# Patient Record
Sex: Female | Born: 1945 | Race: White | Hispanic: No | Marital: Married | Smoking: Former smoker
Health system: Southern US, Community
[De-identification: ages and names within clinical notes are randomized; demographics above are authoritative.]

## PROBLEM LIST (undated history)

## (undated) DIAGNOSIS — E119 Type 2 diabetes mellitus without complications: Secondary | ICD-10-CM

## (undated) DIAGNOSIS — I2510 Atherosclerotic heart disease of native coronary artery without angina pectoris: Secondary | ICD-10-CM

## (undated) DIAGNOSIS — D649 Anemia, unspecified: Secondary | ICD-10-CM

## (undated) DIAGNOSIS — I10 Essential (primary) hypertension: Secondary | ICD-10-CM

## (undated) HISTORY — PX: TONSILLECTOMY: SUR1361

## (undated) HISTORY — PX: BACK SURGERY: SHX140

## (undated) HISTORY — DX: Essential (primary) hypertension: I10

---

## 1970-04-12 HISTORY — PX: CHOLECYSTECTOMY OPEN: SUR202

## 1988-04-12 HISTORY — PX: LUMBAR DISC SURGERY: SHX700

## 2002-04-12 HISTORY — PX: INCISION AND DRAINAGE: SHX5863

## 2002-07-11 ENCOUNTER — Encounter (INDEPENDENT_AMBULATORY_CARE_PROVIDER_SITE_OTHER): Payer: Self-pay | Admitting: *Deleted

## 2002-07-11 ENCOUNTER — Inpatient Hospital Stay (HOSPITAL_COMMUNITY): Admission: RE | Admit: 2002-07-11 | Discharge: 2002-07-16 | Payer: Self-pay | Admitting: Neurosurgery

## 2002-07-11 ENCOUNTER — Encounter: Payer: Self-pay | Admitting: Neurosurgery

## 2002-07-12 ENCOUNTER — Encounter: Payer: Self-pay | Admitting: Neurosurgery

## 2002-07-13 ENCOUNTER — Encounter: Payer: Self-pay | Admitting: Internal Medicine

## 2002-07-24 ENCOUNTER — Encounter: Admission: RE | Admit: 2002-07-24 | Discharge: 2002-10-22 | Payer: Self-pay | Admitting: Family Medicine

## 2002-08-20 ENCOUNTER — Encounter: Admission: RE | Admit: 2002-08-20 | Discharge: 2002-08-20 | Payer: Self-pay | Admitting: Infectious Diseases

## 2002-08-22 ENCOUNTER — Encounter: Admission: RE | Admit: 2002-08-22 | Discharge: 2002-08-22 | Payer: Self-pay | Admitting: Infectious Diseases

## 2002-10-01 ENCOUNTER — Encounter: Admission: RE | Admit: 2002-10-01 | Discharge: 2002-10-01 | Payer: Self-pay | Admitting: Infectious Diseases

## 2002-10-29 ENCOUNTER — Encounter: Admission: RE | Admit: 2002-10-29 | Discharge: 2002-10-29 | Payer: Self-pay | Admitting: Infectious Diseases

## 2002-12-10 ENCOUNTER — Encounter: Admission: RE | Admit: 2002-12-10 | Discharge: 2002-12-10 | Payer: Self-pay | Admitting: Infectious Diseases

## 2002-12-10 ENCOUNTER — Ambulatory Visit (HOSPITAL_COMMUNITY): Admission: RE | Admit: 2002-12-10 | Discharge: 2002-12-10 | Payer: Self-pay | Admitting: Infectious Diseases

## 2002-12-10 ENCOUNTER — Encounter (INDEPENDENT_AMBULATORY_CARE_PROVIDER_SITE_OTHER): Payer: Self-pay | Admitting: Infectious Diseases

## 2003-01-16 ENCOUNTER — Encounter: Admission: RE | Admit: 2003-01-16 | Discharge: 2003-01-16 | Payer: Self-pay | Admitting: Infectious Diseases

## 2007-02-03 ENCOUNTER — Encounter: Admission: RE | Admit: 2007-02-03 | Discharge: 2007-02-03 | Payer: Self-pay | Admitting: Family Medicine

## 2010-08-28 NOTE — Discharge Summary (Signed)
NAME:  Karen Cooper, Karen Cooper                         ACCOUNT NO.:  [id number]   MEDICAL RECORD NO.:  [id number]                   PATIENT TYPE:  INP   LOCATION:  3024                                 FACILITY:  [facility]   PHYSICIAN:  Otilio Connors, M.D.               DATE OF BIRTH:  09-Dec-1945   DATE OF ADMISSION:  07/11/2002  DATE OF DISCHARGE:  07/16/2002                                 DISCHARGE SUMMARY   DIAGNOSIS:  Lumbar paraspinal epidural abscess.   DISCHARGE DIAGNOSIS:  Lumbar paraspinal epidural abscess.   PROCEDURES:  Laminectomy and drainage of epidural abscess and paraspinous  and psoas abscess on July 11, 2002.   CONSULTATIONS:  Dr. Arelia Longest. Quentin Cornwall, infectious disease.   REASON FOR ADMISSION:  The patient is a 65 year old woman who was having  right-sided back pain and upper leg pain.  She had an MRI done showing some  abnormalities as well as tumor versus abscess.  She was sent back for an MRI  with contrast and some labs.  White count was 24, sed rate 90, C-reactive  protein 3.4.  Urinalysis showed glucose greater than 1000; she does give a  history of borderline diabetes and patient may be a full diabetic at this  point.  MRI showed a psoas abscess extending to the posterior paraspinal  muscles, eating at the 3-4 facet and going into the epidural space and  extending up to 2-3.  The patient was brought in for laminectomy, drainage  of the abscess and cultures.   HOSPITAL COURSE:  The patient was admitted and underwent surgery, as  mentioned above, on July 11, 2002.  JP drain was placed and surgical Gram's  stain was positive for gram-positive cocci.  Infectious disease was  consulted and the patient was subsequently begun on antibiotics.  JP drain  has put out 40 to 50 mL in the first 24 hours.  She had some incisional pain  but ambulating and incision was dry and continued to do well.  Blood sugars  were elevated and we consulted medicine for their  recommendations there.  Cultures were positive and kept on antibiotics and was set up for home  health antibiotics, namely Rocephin.  She continued to improve with  lessening of pain, ambulating all right, afebrile and the patient was  discharged home on July 16, 2002 with home health antibiotics, to follow up  with me in one week for evaluation of incision, to follow up with infectious  disease, Dr. Quentin Cornwall, and to follow up with her primary M.D. for evaluation  for diabetes and evaluation for need for any further treatment there.                                               Otilio Connors,  M.D.    JRH/MEDQ  D:  08/14/2002  T:  08/15/2002  Job:  [id number]

## 2010-08-28 NOTE — Op Note (Signed)
NAME:  Karen Cooper, Karen Cooper                         ACCOUNT NO.:  [id number]   MEDICAL RECORD NO.:  [id number]                   PATIENT TYPE:  INP   LOCATION:  2871                                 FACILITY:  [facility]   PHYSICIAN:  Otilio Connors, M.D.               DATE OF BIRTH:  March 27, 1946   DATE OF PROCEDURE:  07/11/2002  DATE OF DISCHARGE:                                 OPERATIVE REPORT   PREOPERATIVE DIAGNOSIS:  Lumbar spinal with epidural abscess, paraspinal  psoas abscess spontaneous.   POSTOPERATIVE DIAGNOSIS:  Lumbar spinal with epidural abscess, paraspinal  psoas abscess spontaneous.   OPERATION:  L3-4 laminectomy evacuation of dural hematoma facetectomy  drainage paraspinous and psoas abscess.   SURGEON:  Otilio Connors, M.D.   ASSISTANT:  Earleen Newport, M.D.   ANESTHESIA:  Endotracheal   ESTIMATED BLOOD LOSS:  100 cc.   FLUIDS REPLACED:  None.   DRAIN:  JP drain placed.   GRAM STAIN:  Showed gram positive cocci.   INDICATIONS FOR PROCEDURE:  The patient is a 65 year old woman who gives a  history of borderline diabetes but found to have a high sugar on  preoperative labs and ketones and glucose in the urine.  She had abscess  diagnosed by MRI in the psoas paraspinous musculature and extending into the  epidural space at L3-4 and part of the 3-4 facet.  The patient brought in  for drainage of abscess.   DESCRIPTION OF PROCEDURE:  The patient was brought to the operating room.  General anesthesia was induced.  The patient was placed in a prone position  on a Wilson frame with all pressure points padded.  The patient was prepped  and draped in the usual sterile fashion.  Site of incision was injected with  7 cc of 1% Lidocaine with epinephrine.  Needle was placed in the interspace.  The needle was pointing at what looked like the prespinous process but we  did not see the sacral ring in five vertebral bodies clearly.  Incision was  then made over this area,  taken to the fascia.  Hemostasis obtained with  Bovie cauterization.  The fascia was incised on the right side and  subperiosteal dissection was done over the L3 and 4 spinous processes where  first x-ray obtained.  We got a better x-ray at this time and this did look  like our marker was at the 2-3 interspace.  We dissected that inferiorly and  placed retractors there, as we did that pus came out of the paraspinous  muscles and we evacuated that and cultured this for anaerobic, aerobic and  fungi with stat Gram stain.  Gram stain came back positive for cocci.  We  took another x-ray at the 3-4 confirming our position.  High speed drill was  then used to perform a laminectomy and facetectomy was completed with  Kerrison rongeurs.  There was  pus coming out of the facet joint.  The  superior facet was removed.  When we were finished we had good decompression  of the thecal sac and we were able to put a dilator up from superior  exposure up to the 2-3 interspace and there was no further compression or  pus.  We put a probe out the foramen and it was also decompressed.  Attention then was taken down to the lateral facet following the transverse  process and just above the transverse process we placed a probe down into  the psoas and pus was obtained.  We placed a red rubber catheter into the  psoas and irrigated more pus out.  We then irrigated with antibiotic  solution.  We gave the patient Vancomycin and Gentamicin IV at this point.  Now that her cultures were obtained.  Wound was irrigated with antibiotic  solution and JP drain was placed through separate stab wound and placed  lateral to the facets off the dura.  Retractor was removed.  The fascia was  closed with 2-0 Nylon interrupted sutures, approximating the fascia and then  the skin was closed with 2-0 Nylon mattress sutures approximating the skin,  overall fairly loose closure allowing drainage if need be.  Drain was  sutured with 2-0  Nylon fastening the JP well.  Dressing was placed.  The  patient was placed back to a supine position, awakened from anesthesia and  transferred to recovery room in stable condition.  The patient tolerated the  procedure well.                                               Otilio Connors, M.D.    JRH/MEDQ  D:  07/11/2002  T:  07/12/2002  Job:  [id number]

## 2011-11-05 DIAGNOSIS — H6980 Other specified disorders of Eustachian tube, unspecified ear: Secondary | ICD-10-CM | POA: Diagnosis not present

## 2011-11-05 DIAGNOSIS — H9190 Unspecified hearing loss, unspecified ear: Secondary | ICD-10-CM | POA: Diagnosis not present

## 2011-12-20 DIAGNOSIS — J309 Allergic rhinitis, unspecified: Secondary | ICD-10-CM | POA: Diagnosis not present

## 2011-12-20 DIAGNOSIS — F17200 Nicotine dependence, unspecified, uncomplicated: Secondary | ICD-10-CM | POA: Diagnosis not present

## 2011-12-20 DIAGNOSIS — I10 Essential (primary) hypertension: Secondary | ICD-10-CM | POA: Diagnosis not present

## 2012-01-03 DIAGNOSIS — I10 Essential (primary) hypertension: Secondary | ICD-10-CM | POA: Diagnosis not present

## 2012-01-03 DIAGNOSIS — F17200 Nicotine dependence, unspecified, uncomplicated: Secondary | ICD-10-CM | POA: Diagnosis not present

## 2012-01-03 DIAGNOSIS — Z23 Encounter for immunization: Secondary | ICD-10-CM | POA: Diagnosis not present

## 2012-01-03 DIAGNOSIS — I509 Heart failure, unspecified: Secondary | ICD-10-CM | POA: Diagnosis not present

## 2012-01-25 DIAGNOSIS — F17200 Nicotine dependence, unspecified, uncomplicated: Secondary | ICD-10-CM | POA: Diagnosis not present

## 2012-01-25 DIAGNOSIS — Z0000 Encounter for general adult medical examination without abnormal findings: Secondary | ICD-10-CM | POA: Diagnosis not present

## 2012-01-25 DIAGNOSIS — I10 Essential (primary) hypertension: Secondary | ICD-10-CM | POA: Diagnosis not present

## 2012-02-01 DIAGNOSIS — F17200 Nicotine dependence, unspecified, uncomplicated: Secondary | ICD-10-CM | POA: Diagnosis not present

## 2012-02-01 DIAGNOSIS — E785 Hyperlipidemia, unspecified: Secondary | ICD-10-CM | POA: Diagnosis not present

## 2012-02-01 DIAGNOSIS — E1129 Type 2 diabetes mellitus with other diabetic kidney complication: Secondary | ICD-10-CM | POA: Diagnosis not present

## 2012-02-01 DIAGNOSIS — N182 Chronic kidney disease, stage 2 (mild): Secondary | ICD-10-CM | POA: Diagnosis not present

## 2012-02-01 DIAGNOSIS — E1165 Type 2 diabetes mellitus with hyperglycemia: Secondary | ICD-10-CM | POA: Diagnosis not present

## 2012-02-01 DIAGNOSIS — Z13820 Encounter for screening for osteoporosis: Secondary | ICD-10-CM | POA: Diagnosis not present

## 2012-02-01 DIAGNOSIS — R3129 Other microscopic hematuria: Secondary | ICD-10-CM | POA: Diagnosis not present

## 2012-02-01 DIAGNOSIS — I10 Essential (primary) hypertension: Secondary | ICD-10-CM | POA: Diagnosis not present

## 2012-03-02 DIAGNOSIS — R3129 Other microscopic hematuria: Secondary | ICD-10-CM | POA: Diagnosis not present

## 2012-04-26 DIAGNOSIS — E785 Hyperlipidemia, unspecified: Secondary | ICD-10-CM | POA: Diagnosis not present

## 2012-04-26 DIAGNOSIS — E1129 Type 2 diabetes mellitus with other diabetic kidney complication: Secondary | ICD-10-CM | POA: Diagnosis not present

## 2012-04-26 DIAGNOSIS — I10 Essential (primary) hypertension: Secondary | ICD-10-CM | POA: Diagnosis not present

## 2012-05-03 DIAGNOSIS — N182 Chronic kidney disease, stage 2 (mild): Secondary | ICD-10-CM | POA: Diagnosis not present

## 2012-05-03 DIAGNOSIS — I10 Essential (primary) hypertension: Secondary | ICD-10-CM | POA: Diagnosis not present

## 2012-05-03 DIAGNOSIS — E1165 Type 2 diabetes mellitus with hyperglycemia: Secondary | ICD-10-CM | POA: Diagnosis not present

## 2012-05-03 DIAGNOSIS — E1129 Type 2 diabetes mellitus with other diabetic kidney complication: Secondary | ICD-10-CM | POA: Diagnosis not present

## 2012-05-03 DIAGNOSIS — F17200 Nicotine dependence, unspecified, uncomplicated: Secondary | ICD-10-CM | POA: Diagnosis not present

## 2012-05-03 DIAGNOSIS — E785 Hyperlipidemia, unspecified: Secondary | ICD-10-CM | POA: Diagnosis not present

## 2012-07-26 DIAGNOSIS — E1165 Type 2 diabetes mellitus with hyperglycemia: Secondary | ICD-10-CM | POA: Diagnosis not present

## 2012-07-26 DIAGNOSIS — I10 Essential (primary) hypertension: Secondary | ICD-10-CM | POA: Diagnosis not present

## 2012-07-26 DIAGNOSIS — E1129 Type 2 diabetes mellitus with other diabetic kidney complication: Secondary | ICD-10-CM | POA: Diagnosis not present

## 2012-08-02 DIAGNOSIS — E785 Hyperlipidemia, unspecified: Secondary | ICD-10-CM | POA: Diagnosis not present

## 2012-08-02 DIAGNOSIS — E1165 Type 2 diabetes mellitus with hyperglycemia: Secondary | ICD-10-CM | POA: Diagnosis not present

## 2012-08-02 DIAGNOSIS — I10 Essential (primary) hypertension: Secondary | ICD-10-CM | POA: Diagnosis not present

## 2012-08-02 DIAGNOSIS — N182 Chronic kidney disease, stage 2 (mild): Secondary | ICD-10-CM | POA: Diagnosis not present

## 2012-11-01 DIAGNOSIS — E785 Hyperlipidemia, unspecified: Secondary | ICD-10-CM | POA: Diagnosis not present

## 2012-11-01 DIAGNOSIS — E1129 Type 2 diabetes mellitus with other diabetic kidney complication: Secondary | ICD-10-CM | POA: Diagnosis not present

## 2012-11-08 DIAGNOSIS — E785 Hyperlipidemia, unspecified: Secondary | ICD-10-CM | POA: Diagnosis not present

## 2012-11-08 DIAGNOSIS — I10 Essential (primary) hypertension: Secondary | ICD-10-CM | POA: Diagnosis not present

## 2012-11-08 DIAGNOSIS — E1129 Type 2 diabetes mellitus with other diabetic kidney complication: Secondary | ICD-10-CM | POA: Diagnosis not present

## 2012-11-08 DIAGNOSIS — F17200 Nicotine dependence, unspecified, uncomplicated: Secondary | ICD-10-CM | POA: Diagnosis not present

## 2012-11-08 DIAGNOSIS — N182 Chronic kidney disease, stage 2 (mild): Secondary | ICD-10-CM | POA: Diagnosis not present

## 2013-02-05 DIAGNOSIS — I10 Essential (primary) hypertension: Secondary | ICD-10-CM | POA: Diagnosis not present

## 2013-02-05 DIAGNOSIS — E1129 Type 2 diabetes mellitus with other diabetic kidney complication: Secondary | ICD-10-CM | POA: Diagnosis not present

## 2013-02-05 DIAGNOSIS — Z23 Encounter for immunization: Secondary | ICD-10-CM | POA: Diagnosis not present

## 2013-02-05 DIAGNOSIS — Z0000 Encounter for general adult medical examination without abnormal findings: Secondary | ICD-10-CM | POA: Diagnosis not present

## 2013-02-05 DIAGNOSIS — Z1331 Encounter for screening for depression: Secondary | ICD-10-CM | POA: Diagnosis not present

## 2013-02-13 DIAGNOSIS — E785 Hyperlipidemia, unspecified: Secondary | ICD-10-CM | POA: Diagnosis not present

## 2013-02-13 DIAGNOSIS — I10 Essential (primary) hypertension: Secondary | ICD-10-CM | POA: Diagnosis not present

## 2013-02-13 DIAGNOSIS — E1129 Type 2 diabetes mellitus with other diabetic kidney complication: Secondary | ICD-10-CM | POA: Diagnosis not present

## 2013-02-13 DIAGNOSIS — F17200 Nicotine dependence, unspecified, uncomplicated: Secondary | ICD-10-CM | POA: Diagnosis not present

## 2013-03-14 DIAGNOSIS — E119 Type 2 diabetes mellitus without complications: Secondary | ICD-10-CM | POA: Diagnosis not present

## 2013-05-15 DIAGNOSIS — E785 Hyperlipidemia, unspecified: Secondary | ICD-10-CM | POA: Diagnosis not present

## 2013-05-15 DIAGNOSIS — E1165 Type 2 diabetes mellitus with hyperglycemia: Secondary | ICD-10-CM | POA: Diagnosis not present

## 2013-05-15 DIAGNOSIS — E1129 Type 2 diabetes mellitus with other diabetic kidney complication: Secondary | ICD-10-CM | POA: Diagnosis not present

## 2013-05-22 DIAGNOSIS — E1129 Type 2 diabetes mellitus with other diabetic kidney complication: Secondary | ICD-10-CM | POA: Diagnosis not present

## 2013-05-22 DIAGNOSIS — N183 Chronic kidney disease, stage 3 unspecified: Secondary | ICD-10-CM | POA: Diagnosis not present

## 2013-05-22 DIAGNOSIS — F17200 Nicotine dependence, unspecified, uncomplicated: Secondary | ICD-10-CM | POA: Diagnosis not present

## 2013-05-22 DIAGNOSIS — E785 Hyperlipidemia, unspecified: Secondary | ICD-10-CM | POA: Diagnosis not present

## 2013-05-22 DIAGNOSIS — I10 Essential (primary) hypertension: Secondary | ICD-10-CM | POA: Diagnosis not present

## 2013-08-13 DIAGNOSIS — E1129 Type 2 diabetes mellitus with other diabetic kidney complication: Secondary | ICD-10-CM | POA: Diagnosis not present

## 2013-08-13 DIAGNOSIS — I10 Essential (primary) hypertension: Secondary | ICD-10-CM | POA: Diagnosis not present

## 2013-08-20 DIAGNOSIS — F17200 Nicotine dependence, unspecified, uncomplicated: Secondary | ICD-10-CM | POA: Diagnosis not present

## 2013-08-20 DIAGNOSIS — E785 Hyperlipidemia, unspecified: Secondary | ICD-10-CM | POA: Diagnosis not present

## 2013-08-20 DIAGNOSIS — I10 Essential (primary) hypertension: Secondary | ICD-10-CM | POA: Diagnosis not present

## 2013-08-20 DIAGNOSIS — N183 Chronic kidney disease, stage 3 unspecified: Secondary | ICD-10-CM | POA: Diagnosis not present

## 2013-08-20 DIAGNOSIS — E1129 Type 2 diabetes mellitus with other diabetic kidney complication: Secondary | ICD-10-CM | POA: Diagnosis not present

## 2013-11-14 DIAGNOSIS — E1129 Type 2 diabetes mellitus with other diabetic kidney complication: Secondary | ICD-10-CM | POA: Diagnosis not present

## 2013-11-14 DIAGNOSIS — I10 Essential (primary) hypertension: Secondary | ICD-10-CM | POA: Diagnosis not present

## 2013-11-20 DIAGNOSIS — I10 Essential (primary) hypertension: Secondary | ICD-10-CM | POA: Diagnosis not present

## 2013-11-20 DIAGNOSIS — F17200 Nicotine dependence, unspecified, uncomplicated: Secondary | ICD-10-CM | POA: Diagnosis not present

## 2013-11-20 DIAGNOSIS — E785 Hyperlipidemia, unspecified: Secondary | ICD-10-CM | POA: Diagnosis not present

## 2013-11-20 DIAGNOSIS — N183 Chronic kidney disease, stage 3 unspecified: Secondary | ICD-10-CM | POA: Diagnosis not present

## 2013-11-20 DIAGNOSIS — E1129 Type 2 diabetes mellitus with other diabetic kidney complication: Secondary | ICD-10-CM | POA: Diagnosis not present

## 2014-02-22 DIAGNOSIS — Z0000 Encounter for general adult medical examination without abnormal findings: Secondary | ICD-10-CM | POA: Diagnosis not present

## 2014-02-22 DIAGNOSIS — I10 Essential (primary) hypertension: Secondary | ICD-10-CM | POA: Diagnosis not present

## 2014-02-22 DIAGNOSIS — Z1389 Encounter for screening for other disorder: Secondary | ICD-10-CM | POA: Diagnosis not present

## 2014-02-22 DIAGNOSIS — E1121 Type 2 diabetes mellitus with diabetic nephropathy: Secondary | ICD-10-CM | POA: Diagnosis not present

## 2014-02-22 DIAGNOSIS — Z23 Encounter for immunization: Secondary | ICD-10-CM | POA: Diagnosis not present

## 2014-02-28 DIAGNOSIS — F17210 Nicotine dependence, cigarettes, uncomplicated: Secondary | ICD-10-CM | POA: Diagnosis not present

## 2014-02-28 DIAGNOSIS — E785 Hyperlipidemia, unspecified: Secondary | ICD-10-CM | POA: Diagnosis not present

## 2014-02-28 DIAGNOSIS — N183 Chronic kidney disease, stage 3 (moderate): Secondary | ICD-10-CM | POA: Diagnosis not present

## 2014-02-28 DIAGNOSIS — E1121 Type 2 diabetes mellitus with diabetic nephropathy: Secondary | ICD-10-CM | POA: Diagnosis not present

## 2014-02-28 DIAGNOSIS — I10 Essential (primary) hypertension: Secondary | ICD-10-CM | POA: Diagnosis not present

## 2014-08-29 DIAGNOSIS — I10 Essential (primary) hypertension: Secondary | ICD-10-CM | POA: Diagnosis not present

## 2014-08-29 DIAGNOSIS — E1121 Type 2 diabetes mellitus with diabetic nephropathy: Secondary | ICD-10-CM | POA: Diagnosis not present

## 2014-09-05 DIAGNOSIS — E785 Hyperlipidemia, unspecified: Secondary | ICD-10-CM | POA: Diagnosis not present

## 2014-09-05 DIAGNOSIS — E1121 Type 2 diabetes mellitus with diabetic nephropathy: Secondary | ICD-10-CM | POA: Diagnosis not present

## 2014-09-05 DIAGNOSIS — N183 Chronic kidney disease, stage 3 (moderate): Secondary | ICD-10-CM | POA: Diagnosis not present

## 2014-09-05 DIAGNOSIS — I10 Essential (primary) hypertension: Secondary | ICD-10-CM | POA: Diagnosis not present

## 2014-12-04 DIAGNOSIS — E1121 Type 2 diabetes mellitus with diabetic nephropathy: Secondary | ICD-10-CM | POA: Diagnosis not present

## 2014-12-04 DIAGNOSIS — I10 Essential (primary) hypertension: Secondary | ICD-10-CM | POA: Diagnosis not present

## 2015-01-21 DIAGNOSIS — Z23 Encounter for immunization: Secondary | ICD-10-CM | POA: Diagnosis not present

## 2015-03-17 DIAGNOSIS — I129 Hypertensive chronic kidney disease with stage 1 through stage 4 chronic kidney disease, or unspecified chronic kidney disease: Secondary | ICD-10-CM | POA: Diagnosis not present

## 2015-03-17 DIAGNOSIS — I509 Heart failure, unspecified: Secondary | ICD-10-CM | POA: Diagnosis not present

## 2015-03-17 DIAGNOSIS — Z1389 Encounter for screening for other disorder: Secondary | ICD-10-CM | POA: Diagnosis not present

## 2015-03-17 DIAGNOSIS — Z0001 Encounter for general adult medical examination with abnormal findings: Secondary | ICD-10-CM | POA: Diagnosis not present

## 2015-03-17 DIAGNOSIS — E1122 Type 2 diabetes mellitus with diabetic chronic kidney disease: Secondary | ICD-10-CM | POA: Diagnosis not present

## 2015-03-17 DIAGNOSIS — F17210 Nicotine dependence, cigarettes, uncomplicated: Secondary | ICD-10-CM | POA: Diagnosis not present

## 2015-03-17 DIAGNOSIS — E785 Hyperlipidemia, unspecified: Secondary | ICD-10-CM | POA: Diagnosis not present

## 2015-03-20 DIAGNOSIS — Z720 Tobacco use: Secondary | ICD-10-CM | POA: Diagnosis not present

## 2015-03-20 DIAGNOSIS — J449 Chronic obstructive pulmonary disease, unspecified: Secondary | ICD-10-CM | POA: Diagnosis not present

## 2015-03-20 DIAGNOSIS — Z716 Tobacco abuse counseling: Secondary | ICD-10-CM | POA: Diagnosis not present

## 2015-09-15 DIAGNOSIS — I129 Hypertensive chronic kidney disease with stage 1 through stage 4 chronic kidney disease, or unspecified chronic kidney disease: Secondary | ICD-10-CM | POA: Diagnosis not present

## 2015-09-15 DIAGNOSIS — E785 Hyperlipidemia, unspecified: Secondary | ICD-10-CM | POA: Diagnosis not present

## 2015-09-15 DIAGNOSIS — I10 Essential (primary) hypertension: Secondary | ICD-10-CM | POA: Diagnosis not present

## 2015-09-22 DIAGNOSIS — J449 Chronic obstructive pulmonary disease, unspecified: Secondary | ICD-10-CM | POA: Diagnosis not present

## 2015-09-22 DIAGNOSIS — E1122 Type 2 diabetes mellitus with diabetic chronic kidney disease: Secondary | ICD-10-CM | POA: Diagnosis not present

## 2015-09-22 DIAGNOSIS — Z23 Encounter for immunization: Secondary | ICD-10-CM | POA: Diagnosis not present

## 2015-09-22 DIAGNOSIS — I509 Heart failure, unspecified: Secondary | ICD-10-CM | POA: Diagnosis not present

## 2015-09-22 DIAGNOSIS — I129 Hypertensive chronic kidney disease with stage 1 through stage 4 chronic kidney disease, or unspecified chronic kidney disease: Secondary | ICD-10-CM | POA: Diagnosis not present

## 2015-09-22 DIAGNOSIS — F17211 Nicotine dependence, cigarettes, in remission: Secondary | ICD-10-CM | POA: Diagnosis not present

## 2015-12-12 ENCOUNTER — Other Ambulatory Visit: Payer: Self-pay

## 2016-02-17 DIAGNOSIS — Z1211 Encounter for screening for malignant neoplasm of colon: Secondary | ICD-10-CM | POA: Diagnosis not present

## 2016-02-17 DIAGNOSIS — Z1212 Encounter for screening for malignant neoplasm of rectum: Secondary | ICD-10-CM | POA: Diagnosis not present

## 2016-02-25 DIAGNOSIS — Z23 Encounter for immunization: Secondary | ICD-10-CM | POA: Diagnosis not present

## 2016-03-16 DIAGNOSIS — I10 Essential (primary) hypertension: Secondary | ICD-10-CM | POA: Diagnosis not present

## 2016-03-16 DIAGNOSIS — E785 Hyperlipidemia, unspecified: Secondary | ICD-10-CM | POA: Diagnosis not present

## 2016-03-16 DIAGNOSIS — E1122 Type 2 diabetes mellitus with diabetic chronic kidney disease: Secondary | ICD-10-CM | POA: Diagnosis not present

## 2016-03-16 DIAGNOSIS — N390 Urinary tract infection, site not specified: Secondary | ICD-10-CM | POA: Diagnosis not present

## 2016-03-16 DIAGNOSIS — E1121 Type 2 diabetes mellitus with diabetic nephropathy: Secondary | ICD-10-CM | POA: Diagnosis not present

## 2016-03-16 DIAGNOSIS — Z0000 Encounter for general adult medical examination without abnormal findings: Secondary | ICD-10-CM | POA: Diagnosis not present

## 2016-03-16 DIAGNOSIS — I129 Hypertensive chronic kidney disease with stage 1 through stage 4 chronic kidney disease, or unspecified chronic kidney disease: Secondary | ICD-10-CM | POA: Diagnosis not present

## 2016-03-23 DIAGNOSIS — N183 Chronic kidney disease, stage 3 (moderate): Secondary | ICD-10-CM | POA: Diagnosis not present

## 2016-03-23 DIAGNOSIS — I129 Hypertensive chronic kidney disease with stage 1 through stage 4 chronic kidney disease, or unspecified chronic kidney disease: Secondary | ICD-10-CM | POA: Diagnosis not present

## 2016-03-23 DIAGNOSIS — E1122 Type 2 diabetes mellitus with diabetic chronic kidney disease: Secondary | ICD-10-CM | POA: Diagnosis not present

## 2016-03-23 DIAGNOSIS — J449 Chronic obstructive pulmonary disease, unspecified: Secondary | ICD-10-CM | POA: Diagnosis not present

## 2016-06-14 DIAGNOSIS — J019 Acute sinusitis, unspecified: Secondary | ICD-10-CM | POA: Diagnosis not present

## 2016-09-15 DIAGNOSIS — E1122 Type 2 diabetes mellitus with diabetic chronic kidney disease: Secondary | ICD-10-CM | POA: Diagnosis not present

## 2016-09-15 DIAGNOSIS — I129 Hypertensive chronic kidney disease with stage 1 through stage 4 chronic kidney disease, or unspecified chronic kidney disease: Secondary | ICD-10-CM | POA: Diagnosis not present

## 2016-09-15 DIAGNOSIS — E785 Hyperlipidemia, unspecified: Secondary | ICD-10-CM | POA: Diagnosis not present

## 2016-09-22 DIAGNOSIS — E1122 Type 2 diabetes mellitus with diabetic chronic kidney disease: Secondary | ICD-10-CM | POA: Diagnosis not present

## 2016-09-22 DIAGNOSIS — N183 Chronic kidney disease, stage 3 (moderate): Secondary | ICD-10-CM | POA: Diagnosis not present

## 2016-09-22 DIAGNOSIS — J449 Chronic obstructive pulmonary disease, unspecified: Secondary | ICD-10-CM | POA: Diagnosis not present

## 2016-09-22 DIAGNOSIS — I129 Hypertensive chronic kidney disease with stage 1 through stage 4 chronic kidney disease, or unspecified chronic kidney disease: Secondary | ICD-10-CM | POA: Diagnosis not present

## 2017-01-16 DIAGNOSIS — Z23 Encounter for immunization: Secondary | ICD-10-CM | POA: Diagnosis not present

## 2017-04-20 DIAGNOSIS — E559 Vitamin D deficiency, unspecified: Secondary | ICD-10-CM | POA: Diagnosis not present

## 2017-04-20 DIAGNOSIS — I129 Hypertensive chronic kidney disease with stage 1 through stage 4 chronic kidney disease, or unspecified chronic kidney disease: Secondary | ICD-10-CM | POA: Diagnosis not present

## 2017-04-20 DIAGNOSIS — E785 Hyperlipidemia, unspecified: Secondary | ICD-10-CM | POA: Diagnosis not present

## 2017-04-20 DIAGNOSIS — E1122 Type 2 diabetes mellitus with diabetic chronic kidney disease: Secondary | ICD-10-CM | POA: Diagnosis not present

## 2017-04-27 DIAGNOSIS — E7800 Pure hypercholesterolemia, unspecified: Secondary | ICD-10-CM | POA: Diagnosis not present

## 2017-04-27 DIAGNOSIS — Z87891 Personal history of nicotine dependence: Secondary | ICD-10-CM | POA: Diagnosis not present

## 2017-04-27 DIAGNOSIS — D72829 Elevated white blood cell count, unspecified: Secondary | ICD-10-CM | POA: Diagnosis not present

## 2017-04-27 DIAGNOSIS — R809 Proteinuria, unspecified: Secondary | ICD-10-CM | POA: Diagnosis not present

## 2017-04-27 DIAGNOSIS — E559 Vitamin D deficiency, unspecified: Secondary | ICD-10-CM | POA: Diagnosis not present

## 2017-04-27 DIAGNOSIS — I10 Essential (primary) hypertension: Secondary | ICD-10-CM | POA: Diagnosis not present

## 2017-04-27 DIAGNOSIS — N183 Chronic kidney disease, stage 3 (moderate): Secondary | ICD-10-CM | POA: Diagnosis not present

## 2017-04-27 DIAGNOSIS — E119 Type 2 diabetes mellitus without complications: Secondary | ICD-10-CM | POA: Diagnosis not present

## 2017-10-25 DIAGNOSIS — E7800 Pure hypercholesterolemia, unspecified: Secondary | ICD-10-CM | POA: Diagnosis not present

## 2017-10-25 DIAGNOSIS — E119 Type 2 diabetes mellitus without complications: Secondary | ICD-10-CM | POA: Diagnosis not present

## 2017-10-25 DIAGNOSIS — E559 Vitamin D deficiency, unspecified: Secondary | ICD-10-CM | POA: Diagnosis not present

## 2017-11-01 DIAGNOSIS — E119 Type 2 diabetes mellitus without complications: Secondary | ICD-10-CM | POA: Diagnosis not present

## 2017-11-01 DIAGNOSIS — E7800 Pure hypercholesterolemia, unspecified: Secondary | ICD-10-CM | POA: Diagnosis not present

## 2017-11-01 DIAGNOSIS — I10 Essential (primary) hypertension: Secondary | ICD-10-CM | POA: Diagnosis not present

## 2017-11-01 DIAGNOSIS — R9431 Abnormal electrocardiogram [ECG] [EKG]: Secondary | ICD-10-CM | POA: Diagnosis not present

## 2017-11-01 DIAGNOSIS — E559 Vitamin D deficiency, unspecified: Secondary | ICD-10-CM | POA: Diagnosis not present

## 2018-01-02 ENCOUNTER — Encounter

## 2018-01-02 ENCOUNTER — Ambulatory Visit (INDEPENDENT_AMBULATORY_CARE_PROVIDER_SITE_OTHER): Payer: Medicare Other | Admitting: Cardiovascular Disease

## 2018-01-02 ENCOUNTER — Encounter: Payer: Self-pay | Admitting: Cardiovascular Disease

## 2018-01-02 VITALS — BP 142/86 | HR 97 | Ht 64.0 in | Wt 218.0 lb

## 2018-01-02 DIAGNOSIS — I447 Left bundle-branch block, unspecified: Secondary | ICD-10-CM

## 2018-01-02 DIAGNOSIS — Z87891 Personal history of nicotine dependence: Secondary | ICD-10-CM | POA: Diagnosis not present

## 2018-01-02 DIAGNOSIS — E669 Obesity, unspecified: Secondary | ICD-10-CM

## 2018-01-02 DIAGNOSIS — I10 Essential (primary) hypertension: Secondary | ICD-10-CM | POA: Diagnosis not present

## 2018-01-02 DIAGNOSIS — E1169 Type 2 diabetes mellitus with other specified complication: Secondary | ICD-10-CM

## 2018-01-02 NOTE — Patient Instructions (Signed)
Medication Instructions: Dr Sallyanne Kuster recommends that you continue on your current medications as directed. Please refer to the Current Medication list given to you today.  Labwork: NONE ORDERED  Testing/Procedures: 1. Echocardiogram - Your physician has requested that you have an echocardiogram. Echocardiography is a painless test that uses sound waves to create images of your heart. It provides your doctor with information about the size and shape of your heart and how well your heart's chambers and valves are working. This procedure takes approximately one hour. There are no restrictions for this procedure.  2. Leane Call Stress Test - Your physician has requested that you have a lexiscan myoview. For further information please visit [email or web address]. Please follow instruction sheet, as given.  >> These tests will be performed at our [facility] location [place], [place] [address] [phone]  Follow-up: Dr Sallyanne Kuster recommends that you schedule a follow-up appointment in 12 months. You will receive a reminder letter in the mail two months in advance. If you don't receive a letter, please call our office to schedule the follow-up appointment.  If you need a refill on your cardiac medications before your next appointment, please call your pharmacy.

## 2018-01-03 ENCOUNTER — Encounter: Payer: Self-pay | Admitting: Cardiovascular Disease

## 2018-01-03 NOTE — Progress Notes (Signed)
Cardiology Office Note:    Date:  01/03/2018   ID:  Karen Cooper, DOB 03-05-1946, MRN [id number]  PCP:  Karen Cooper, [place]  Cardiologist:  No primary care provider on file.  Electrophysiologist:  None   Referring MD: Karen Gravel, MD   Chief Complaint  Patient presents with  . Abnormal ECG  Karen Cooper is a 72 y.o. female who is being seen today for the evaluation of new LBBB at the request of Karen Gravel, MD.   History of Present Illness:    Karen Cooper is a 72 y.o. female with a hx of systemic hypertension and remote history of smoking recently diagnosed with a left bundle branch block.  Reportedly this is a new abnormality compared to previous tracings, but I cannot see those older ECGs.  The left bundle branch block was discovered incidentally during a routine physical exam.  She has not not had any recent complaints of chest pain, shortness of breath, palpitations, dizziness, focal neurological complaints and she has never experienced syncope.  She is relatively sedentary, but is able to perform activities of daily living without any restrictions.  She has a 40-pack-year history of smoking but quit last December.  Her husband, Karen Cooper, who is also my patient, continues to smoke.  She has had high blood pressure for a very long time but this is generally well controlled systolic blood pressure today is 142 which she reports that usually at home if she checks it is in the 120s.  At her recent office appointment with Karen Cooper her blood pressure was similar at 140/82.  She she is obese and has diabetes mellitus with good control (last hemoglobin A1c 6.5% on metformin only).  Reportedly has a history of hyperlipidemia.  Her most recent LDL cholesterol was 99 and she is currently not taking any lipid-lowering medications.  Her other lipid parameters are within desirable rangwe.  She appears to have CKD stage III with a baseline creatinine around 1.3-1.5 and a GFR of 45-50.  She had  a normal echo in 2004.  She does have a family history of coronary artery disease, but not at young ages (sister died at age 66, mother had CHF died at age 33.).  Past Medical History:  Diagnosis Date  . Hypertension     Past Surgical History:  Procedure Laterality Date  . back surgery  1990   ruptured disk  . BACK SURGERY  2004   infection  . CHOLECYSTECTOMY OPEN  1972    Current Medications: Current Meds  Medication Sig  . amLODipine (NORVASC) 10 MG tablet Take 10 mg by mouth daily.  . Cholecalciferol (VITAMIN D3) 1000 units CAPS Take 2 capsules by mouth daily.  . furosemide (LASIX) 20 MG tablet Take 20 mg by mouth daily as needed.  Marland Kitchen guaiFENesin (MUCINEX) 600 MG 12 hr tablet Take 1,200 mg by mouth 2 (two) times daily as needed.  Marland Kitchen lisinopril-hydrochlorothiazide (PRINZIDE,ZESTORETIC) 20-12.5 MG tablet Take 2 tablets by mouth daily.  . metFORMIN (GLUCOPHAGE-XR) 750 MG 24 hr tablet Take 2 tablets by mouth daily. With evening meal     Allergies:   Patient has no known allergies.   Social History   Socioeconomic History  . Marital status: Married    Spouse name: Not on file  . Number of children: Not on file  . Years of education: Not on file  . Highest education level: Not on file  Occupational History  . Not on file  Social  Needs  . Financial resource strain: Not on file  . Food insecurity:    Worry: Not on file    Inability: Not on file  . Transportation needs:    Medical: Not on file    Non-medical: Not on file  Tobacco Use  . Smoking status: Former Smoker    Packs/day: 1.00    Years: 40.00    Pack years: 40.00    Types: Cigarettes    Last attempt to quit: 04/03/2017    Years since quitting: 0.7  . Smokeless tobacco: Never Used  Substance and Sexual Activity  . Alcohol use: Never    Frequency: Never  . Drug use: Never  . Sexual activity: Not on file  Lifestyle  . Physical activity:    Days per week: Not on file    Minutes per session: Not on file    . Stress: Not on file  Relationships  . Social connections:    Talks on phone: Not on file    Gets together: Not on file    Attends religious service: Not on file    Active member of club or organization: Not on file    Attends meetings of clubs or organizations: Not on file    Relationship status: Not on file  Other Topics Concern  . Not on file  Social History Narrative  . Not on file     Family History: The patient's family history includes Cancer in her sister; Dementia in her mother; Diabetes in her brother; Heart attack in her brother and brother; Heart disease in her father; Heart failure in her mother.  ROS:   Please see the history of present illness.     All other systems reviewed and are negative.  EKGs/Labs/Other Studies Reviewed:    The following studies were reviewed today: Notes and labs from Karen Cooper.  Echo from 2004  EKG:  EKG is ordered today.  The ekg ordered today demonstrates sinus rhythm, left bundle branch block (QRS and 36 ms), QTC 495 ms  Recent Labs: No results found for requested labs within last 8760 hours.  Recent Lipid Panel No results found for: CHOL, TRIG, HDL, CHOLHDL, VLDL, LDLCALC, LDLDIRECT  Physical Exam:    VS:  BP (!) 142/86 (BP Location: Left Arm, Patient Position: Sitting, Cuff Size: Large)   Pulse 97   Ht 5\' 4"  (1.626 m)   Wt 218 lb (98.9 kg)   BMI 37.42 kg/m     Wt Readings from Last 3 Encounters:  01/02/18 218 lb (98.9 kg)     GEN: Severely obese, well nourished, well developed in no acute distress HEENT: Normal NECK: No JVD; No carotid bruits LYMPHATICS: No lymphadenopathy CARDIAC: Paradoxically split S2, RRR, no murmurs, rubs, gallops RESPIRATORY:  Clear to auscultation without rales, wheezing or rhonchi  ABDOMEN: Soft, non-tender, non-distended MUSCULOSKELETAL:  No edema; No deformity  SKIN: Warm and dry NEUROLOGIC:  Alert and oriented x 3 PSYCHIATRIC:  Normal affect   ASSESSMENT:    1. LBBB (left bundle  branch block)   2. Diabetes mellitus type 2 in obese (Karen Cooper)   3. Stopped smoking with greater than 40 pack year history   4. Essential hypertension    PLAN:    In order of problems listed above:  1. LBBB: This may simply represent conduction system disease, but she has numerous risk factors for coronary problems (smoking, diabetes, hypertension, history of hyperlipidemia) and I recommended that she undergo a Lexiscan Myoview.  Also check an echocardiogram  to make sure she still has normal left ventricular systolic function and to screen for other potential causes such as infiltrative disease.  Discussed the fact that this may be an independent abnormality that might predict future need for pacemaker therapy, although there is no indication she will need that anytime soon. 2. DM: Excellent control and would likely be "cured" if she lost substantial more weight. 3. Smoking cessation: Graduated on successfully quitting smoking for the last 10 months.  Need to work on getting her husband Karen Cooper to quit as well. 4. HTN: No changes were made to medications today.  She is already on maximum usual doses of amlodipine and lisinopril and is taking a thiazide diuretic.  If there is evidence of LVH on echo for identify coronary problems, consider adding a low-dose of a beta-blocker.   Medication Adjustments/Labs and Tests Ordered: Current medicines are reviewed at length with the patient today.  Concerns regarding medicines are outlined above.  Orders Placed This Encounter  Procedures  . MYOCARDIAL PERFUSION IMAGING  . EKG 12-Lead  . ECHOCARDIOGRAM COMPLETE   No orders of the defined types were placed in this encounter.   Patient Instructions  Medication Instructions: Dr Sallyanne Kuster recommends that you continue on your current medications as directed. Please refer to the Current Medication list given to you today.  Labwork: NONE ORDERED  Testing/Procedures: 1. Echocardiogram - Your physician has  requested that you have an echocardiogram. Echocardiography is a painless test that uses sound waves to create images of your heart. It provides your doctor with information about the size and shape of your heart and how well your heart's chambers and valves are working. This procedure takes approximately one hour. There are no restrictions for this procedure.  2. Leane Call Stress Test - Your physician has requested that you have a lexiscan myoview. For further information please visit [email or web address].[email or web address]. Please follow instruction sheet, as given.  >> These tests will be performed at our [facility] location [place], [place] [address] [phone]  Follow-up: Dr Sallyanne Kuster recommends that you schedule a follow-up appointment in 12 months. You will receive a reminder letter in the mail two months in advance. If you don't receive a letter, please call our office to schedule the follow-up appointment.  If you need a refill on your cardiac medications before your next appointment, please call your pharmacy.    Signed, Sanda Klein, MD  01/03/2018 4:20 PM    [facility]

## 2018-01-05 ENCOUNTER — Telehealth (HOSPITAL_COMMUNITY): Payer: Self-pay | Admitting: *Deleted

## 2018-01-05 NOTE — Telephone Encounter (Signed)
Patient given detailed instructions per Myocardial Perfusion Study Information Sheet for the test on 01/10/18 at 7:15. Patient notified to arrive 15 minutes early and that it is imperative to arrive on time for appointment to keep from having the test rescheduled.  If you need to cancel or reschedule your appointment, please call the office within 24 hours of your appointment. . Patient verbalized understanding.Karen Cooper

## 2018-01-10 ENCOUNTER — Other Ambulatory Visit: Payer: Self-pay

## 2018-01-10 ENCOUNTER — Ambulatory Visit (HOSPITAL_COMMUNITY): Payer: Medicare Other | Attending: Cardiology

## 2018-01-10 ENCOUNTER — Ambulatory Visit (HOSPITAL_BASED_OUTPATIENT_CLINIC_OR_DEPARTMENT_OTHER): Payer: Medicare Other

## 2018-01-10 DIAGNOSIS — I071 Rheumatic tricuspid insufficiency: Secondary | ICD-10-CM | POA: Insufficient documentation

## 2018-01-10 DIAGNOSIS — I10 Essential (primary) hypertension: Secondary | ICD-10-CM | POA: Diagnosis not present

## 2018-01-10 DIAGNOSIS — I447 Left bundle-branch block, unspecified: Secondary | ICD-10-CM | POA: Diagnosis not present

## 2018-01-10 LAB — MYOCARDIAL PERFUSION IMAGING
CHL CUP NUCLEAR SRS: 2
CHL CUP NUCLEAR SSS: 3
CSEPPHR: 100 {beats}/min
LV sys vol: 49 mL
LVDIAVOL: 85 mL (ref 46–106)
Rest HR: 94 {beats}/min
SDS: 1
TID: 1.03

## 2018-01-10 MED ORDER — REGADENOSON 0.4 MG/5ML IV SOLN
0.4000 mg | Freq: Once | INTRAVENOUS | Status: AC
  Administered 2018-01-10: 0.4 mg via INTRAVENOUS

## 2018-01-10 MED ORDER — TECHNETIUM TC 99M TETROFOSMIN IV KIT
10.2000 | PACK | Freq: Once | INTRAVENOUS | Status: AC | PRN
  Administered 2018-01-10: 10.2 via INTRAVENOUS
  Filled 2018-01-10: qty 11

## 2018-01-10 MED ORDER — TECHNETIUM TC 99M TETROFOSMIN IV KIT
28.3000 | PACK | Freq: Once | INTRAVENOUS | Status: AC | PRN
  Administered 2018-01-10: 28.3 via INTRAVENOUS
  Filled 2018-01-10: qty 29

## 2018-01-13 ENCOUNTER — Encounter: Payer: Self-pay | Admitting: Cardiovascular Disease

## 2018-01-13 ENCOUNTER — Ambulatory Visit (INDEPENDENT_AMBULATORY_CARE_PROVIDER_SITE_OTHER): Payer: Medicare Other | Admitting: Cardiovascular Disease

## 2018-01-13 ENCOUNTER — Other Ambulatory Visit: Payer: Self-pay | Admitting: Cardiovascular Disease

## 2018-01-13 VITALS — BP 162/90 | HR 98 | Ht 64.0 in | Wt 221.0 lb

## 2018-01-13 DIAGNOSIS — E1169 Type 2 diabetes mellitus with other specified complication: Secondary | ICD-10-CM | POA: Diagnosis not present

## 2018-01-13 DIAGNOSIS — I429 Cardiomyopathy, unspecified: Secondary | ICD-10-CM

## 2018-01-13 DIAGNOSIS — E669 Obesity, unspecified: Secondary | ICD-10-CM | POA: Diagnosis not present

## 2018-01-13 DIAGNOSIS — E119 Type 2 diabetes mellitus without complications: Secondary | ICD-10-CM

## 2018-01-13 DIAGNOSIS — N183 Chronic kidney disease, stage 3 unspecified: Secondary | ICD-10-CM

## 2018-01-13 DIAGNOSIS — I447 Left bundle-branch block, unspecified: Secondary | ICD-10-CM

## 2018-01-13 DIAGNOSIS — R9439 Abnormal result of other cardiovascular function study: Secondary | ICD-10-CM

## 2018-01-13 DIAGNOSIS — Z87891 Personal history of nicotine dependence: Secondary | ICD-10-CM | POA: Diagnosis not present

## 2018-01-13 DIAGNOSIS — I10 Essential (primary) hypertension: Secondary | ICD-10-CM | POA: Diagnosis not present

## 2018-01-13 DIAGNOSIS — Z01812 Encounter for preprocedural laboratory examination: Secondary | ICD-10-CM | POA: Diagnosis not present

## 2018-01-13 LAB — CBC
Hematocrit: 33.8 % — ABNORMAL LOW (ref 34.0–46.6)
Hemoglobin: 11.2 g/dL (ref 11.1–15.9)
MCH: 29.5 pg (ref 26.6–33.0)
MCHC: 33.1 g/dL (ref 31.5–35.7)
MCV: 89 fL (ref 79–97)
Platelets: 322 10*3/uL (ref 150–450)
RBC: 3.8 x10E6/uL (ref 3.77–5.28)
RDW: 15.1 % (ref 12.3–15.4)
WBC: 11.4 10*3/uL — ABNORMAL HIGH (ref 3.4–10.8)

## 2018-01-13 LAB — BASIC METABOLIC PANEL
BUN / CREAT RATIO: 15 (ref 12–28)
BUN: 18 mg/dL (ref 8–27)
CO2: 26 mmol/L (ref 20–29)
CREATININE: 1.18 mg/dL — AB (ref 0.57–1.00)
Calcium: 9.5 mg/dL (ref 8.7–10.3)
Chloride: 102 mmol/L (ref 96–106)
GFR calc Af Amer: 53 mL/min/{1.73_m2} — ABNORMAL LOW (ref >59)
GFR, EST NON AFRICAN AMERICAN: 46 mL/min/{1.73_m2} — AB (ref >59)
GLUCOSE: 160 mg/dL — AB (ref 65–99)
Potassium: 5.4 mmol/L — ABNORMAL HIGH (ref 3.5–5.2)
SODIUM: 141 mmol/L (ref 134–144)

## 2018-01-13 NOTE — Patient Instructions (Addendum)
  Karen Cooper CARDIOVASCULAR DIVISION [facility] [address] Karen Cooper [place] [address] Dept: [phone] Loc: [phone]  [name]  01/13/2018  You are scheduled for a Cardiac Catheterization on Wednesday, October 9 with Dr. Harrell Gave End.  1. Please arrive at the [facility] (Main Entrance A) at [facility]: [place], [place] [address] at 1:00 PM (This time is two hours before your procedure to ensure your preparation). Free valet parking service is available.   Special note: Every effort is made to have your procedure done on time. Please understand that emergencies sometimes delay scheduled procedures.  2. Diet: Do not eat solid foods after midnight.  The patient may have clear liquids until 5am upon the day of the procedure.  3. Labs: You will need to have blood drawn on Friday, October 4 at [place]  Open: [place] (Lunch 12:30 - 1:30)   Phone: [phone]. You do not need to be fasting.  4. Medication instructions in preparation for your procedure: HOLD the following medications the morning of your procedure: Lisinopril-HCT Furosemide  Metformin - DO NOT RESTART for 48-hours AFTER your procedure  On the morning of your procedure, take your Aspirin 81 mg and any morning medicines NOT listed above.  You may use sips of water.  Contrast Allergy: No  5. Plan for one night stay--bring personal belongings.  6. Bring a current list of your medications and current insurance cards.  7. You MUST have a responsible person to drive you home.  8. Someone MUST be with you the first 24 hours after you arrive home or your discharge will be delayed.  9. Please wear clothes that are easy to get on and off and wear slip-on shoes.  Thank you for allowing [place] to care for you!   -- [facility]

## 2018-01-13 NOTE — H&P (View-Only) (Signed)
Cardiology Office Note:    Date:  01/13/2018   ID:  Karen Cooper, DOB Sep 06, 1945, MRN [id number]  PCP:  Holland Commons, [place]  Cardiologist:  No primary care provider on file.  Electrophysiologist:  None   Referring MD: Holland Commons, FNP   Chief Complaint  Patient presents with  . Follow-up    stress test  Karen Cooper is a 72 y.o. female who is being seen today for the evaluation of new LBBB at the request of Adria Dill Leonia Reader, [place].   History of Present Illness:    Karen Cooper is a 72 y.o. female with a hx of systemic hypertension and remote history of smoking recently diagnosed with a left bundle branch block.  Reportedly this is a new abnormality compared to previous tracings, but I cannot see those older ECGs.  Both her echocardiogram and the nuclear scintigram showed depressed left ventricular systolic function with an ejection fraction around 40%.  The nuclear perfusion pattern had a fixed anteroseptal/apical defect consistent with a left bundle branch block but no ischemia. The echo suggested akinesis of the basal mid anteroseptal myocardium (also probably attributable to left bundle branch block). She had a normal echo in 2004.  She has a remote history of 40-pack-year smoking, quit last December.  She has treated hypertension.  She she is obese and has diabetes mellitus with good control (last hemoglobin A1c 6.5% on metformin only).  Reportedly has a history of hyperlipidemia.  Her most recent LDL cholesterol was 99 and she is currently not taking any lipid-lowering medications.  Her other lipid parameters are within desirable range.  She appears to have CKD stage III with a baseline creatinine around 1.3-1.5 and a GFR of 45-50.    She does have a family history of coronary artery disease, but not at young ages (sister died at age 48, mother had CHF died at age 9.).  Past Medical History:  Diagnosis Date  . Hypertension     Past Surgical History:    Procedure Laterality Date  . back surgery  1990   ruptured disk  . BACK SURGERY  2004   infection  . CHOLECYSTECTOMY OPEN  1972    Current Medications: Current Meds  Medication Sig  . amLODipine (NORVASC) 10 MG tablet Take 10 mg by mouth daily.  . Cholecalciferol (VITAMIN D3) 1000 units CAPS Take 2 capsules by mouth daily.  . furosemide (LASIX) 20 MG tablet Take 20 mg by mouth daily as needed.  Marland Kitchen guaiFENesin (MUCINEX) 600 MG 12 hr tablet Take 1,200 mg by mouth 2 (two) times daily as needed.  Marland Kitchen lisinopril-hydrochlorothiazide (PRINZIDE,ZESTORETIC) 20-12.5 MG tablet Take 2 tablets by mouth daily.  . metFORMIN (GLUCOPHAGE-XR) 750 MG 24 hr tablet Take 2 tablets by mouth daily. With evening meal     Allergies:   Patient has no known allergies.   Social History   Socioeconomic History  . Marital status: Married    Spouse name: Not on file  . Number of children: Not on file  . Years of education: Not on file  . Highest education level: Not on file  Occupational History  . Not on file  Social Needs  . Financial resource strain: Not on file  . Food insecurity:    Worry: Not on file    Inability: Not on file  . Transportation needs:    Medical: Not on file    Non-medical: Not on file  Tobacco Use  . Smoking status: Former  Smoker    Packs/day: 1.00    Years: 40.00    Pack years: 40.00    Types: Cigarettes    Last attempt to quit: 04/03/2017    Years since quitting: 0.7  . Smokeless tobacco: Never Used  Substance and Sexual Activity  . Alcohol use: Never    Frequency: Never  . Drug use: Never  . Sexual activity: Not on file  Lifestyle  . Physical activity:    Days per week: Not on file    Minutes per session: Not on file  . Stress: Not on file  Relationships  . Social connections:    Talks on phone: Not on file    Gets together: Not on file    Attends religious service: Not on file    Active member of club or organization: Not on file    Attends meetings of clubs  or organizations: Not on file    Relationship status: Not on file  Other Topics Concern  . Not on file  Social History Narrative  . Not on file     Family History: The patient's family history includes Cancer in her sister; Dementia in her mother; Diabetes in her brother; Heart attack in her brother and brother; Heart disease in her father; Heart failure in her mother.  ROS:   Please see the history of present illness.    All other systems are reviewed and are negative  EKGs/Labs/Other Studies Reviewed:    The following studies were reviewed today: Nuclear perfusion images, echo images.  EKG:  EKG is not ordered today.  The ekg ordered at her previous appointment demonstrates sinus rhythm, left bundle branch block (QRS 136 ms), QTC 495 ms   Physical Exam:    VS:  BP (!) 162/90   Pulse 98   Ht 5\' 4"  (1.626 m)   Wt 221 lb (100.2 kg)   BMI 37.93 kg/m     Wt Readings from Last 3 Encounters:  01/13/18 221 lb (100.2 kg)  01/10/18 218 lb (98.9 kg)  01/02/18 218 lb (98.9 kg)      General: Alert, oriented x3, no distress, severely obese, appears anxious Head: no evidence of trauma, PERRL, EOMI, no exophtalmos or lid lag, no myxedema, no xanthelasma; normal ears, nose and oropharynx Neck: normal jugular venous pulsations and no hepatojugular reflux; brisk carotid pulses without delay and no carotid bruits Chest: clear to auscultation, no signs of consolidation by percussion or palpation, normal fremitus, symmetrical and full respiratory excursions Cardiovascular: normal position and quality of the apical impulse, regular rhythm, normal first and second heart sounds, no murmurs, rubs or gallops Abdomen: no tenderness or distention, no masses by palpation, no abnormal pulsatility or arterial bruits, normal bowel sounds, no hepatosplenomegaly Extremities: no clubbing, cyanosis or edema; 2+ radial, ulnar and brachial pulses bilaterally; 2+ right femoral, posterior tibial and dorsalis  pedis pulses; 2+ left femoral, posterior tibial and dorsalis pedis pulses; no subclavian or femoral bruits Neurological: grossly nonfocal Psych: Normal mood and affect   ASSESSMENT:    1. Cardiomyopathy, unspecified type ([place])   2. LBBB (left bundle branch block)   3. Diabetes mellitus type 2 in obese ([place])   4. History of smoking   5. Essential hypertension   6. CKD (chronic kidney disease) stage 3, GFR 30-59 ml/min (HCC)   7. Abnormal nuclear stress test   8. Pre-procedural laboratory examination    PLAN:    In order of problems listed above:  1. Cardiomyopathy: Etiology of her  left ventricular systolic dysfunction is not certain.  Both the perfusion abnormality on the nuclear test and the wall motion abnormality on the echo can be explained by a left bundle branch block.  She has never had angina pectoris, but she does have numerous coronary risk factors including previous smoking, diabetes mellitus, hypertension.  Her last LDL cholesterol was 99, which is satisfactory in the setting of diabetes mellitus, which will be a target for treatment of previous cardiovascular problems.  I have recommended that she undergo cardiac catheterization to clarify the cause of her cardiomyopathy.  If significant CAD is identified, revascularization is indicated even if left ventricular dysfunction is not symptomatic.  Discussed the fact that we will have to change her antihypertensive medications.  She will hold her lisinopril-hydrochlorothiazide for the heart catheterization and will plan to switch her to an ARB such as valsartan after the procedure.  Ultimately would like to get her on Entresto if this is affordable.  She will also hold her furosemide and metformin for the heart catheterization.  We will plan to add carvedilol rather than amlodipine.  Discussed the fact that these medication changes will have to be done in a stepwise fashion. This diagnostic cardiac catheterization and possible percutaneous  revascularization with angioplasty/stent procedure has been fully reviewed with the patient and written informed consent has been obtained. 2. LBBB may explain the wall motion abnormality and fixed perfusion defect/artifact.  Might be an opportunity for biventricular pacing, especially if she has nonischemic cardiomyopathy.  At this point though she does not have a lot of symptoms of heart failure (possibly since she is very sedentary). 3. DM: Excellent control and would likely be "cured" if she lost substantial more weight. 4. Smoking cessation: Congratulated on not smoking for the last 10 months.  Need to work on getting her husband Karen Cooper to quit as well.  He was at the appointment today and we talked about that. 5. HTN: Rather than amlodipine, hydrochlorothiazide and lisinopril we will plan to use Entresto, carvedilol and furosemide for her cardiomyopathy.  We will gradually make these changes as an outpatient depending on the results of her cardiac catheterization and her kidney function. 6. CKD 2-3: Baseline creatinine seems to be 1.3-1.5.  Rechecking labs today.  Especially since she also has diabetes will need hydration before the cardiac catheterization.  Told her to stop her lisinopril-HCTZ, furosemide and metformin before the heart cath.   Medication Adjustments/Labs and Tests Ordered: Current medicines are reviewed at length with the patient today.  Concerns regarding medicines are outlined above.  Orders Placed This Encounter  Procedures  . Basic metabolic panel  . CBC   No orders of the defined types were placed in this encounter.   Patient Instructions   [place] [address] Dept: [phone] Loc: [phone]  Karen Cooper  01/13/2018  You are scheduled for a Cardiac Catheterization on Wednesday, October 9 with Dr. Harrell Gave End.  1. Please arrive at the  [facility] (Main Entrance A) at [facility]: [place], [place] [address] at 1:00 PM (This time is two hours before your procedure to ensure your preparation). Free valet parking service is available.   Special note: Every effort is made to have your procedure done on time. Please understand that emergencies sometimes delay scheduled procedures.  2. Diet: Do not eat solid foods after midnight.  The patient may have clear liquids  until 5am upon the day of the procedure.  3. Labs: You will need to have blood drawn on Friday, October 4 at [place]  Open: [place] (Lunch 12:30 - 1:30)   Phone: [phone]. You do not need to be fasting.  4. Medication instructions in preparation for your procedure: HOLD the following medications the morning of your procedure: Lisinopril-HCT Furosemide  Metformin - DO NOT RESTART for 48-hours AFTER your procedure  On the morning of your procedure, take your Aspirin 81 mg and any morning medicines NOT listed above.  You may use sips of water.  Contrast Allergy: No  5. Plan for one night stay--bring personal belongings.  6. Bring a current list of your medications and current insurance cards.  7. You MUST have a responsible person to drive you home.  8. Someone MUST be with you the first 24 hours after you arrive home or your discharge will be delayed.  9. Please wear clothes that are easy to get on and off and wear slip-on shoes.  Thank you for allowing [place] to care for you!   -- [place] [facility]    Signed, Sanda Klein, MD  01/13/2018 11:49 AM    [place]

## 2018-01-13 NOTE — Progress Notes (Signed)
Cardiology Office Note:    Date:  01/13/2018   ID:  Karen Cooper, DOB [birth year], MRN [id number]  PCP:  Holland Commons, [place]  Cardiologist:  No primary care provider on file.  Electrophysiologist:  None   Referring MD: Holland Commons, FNP   Chief Complaint  Patient presents with  . Follow-up    stress test  Karen Cooper is a 72 y.o. female who is being seen today for the evaluation of new LBBB at the request of Adria Dill Leonia Reader, [place].   History of Present Illness:    Karen Cooper is a 72 y.o. female with a hx of systemic hypertension and remote history of smoking recently diagnosed with a left bundle branch block.  Reportedly this is a new abnormality compared to previous tracings, but I cannot see those older ECGs.  Both her echocardiogram and the nuclear scintigram showed depressed left ventricular systolic function with an ejection fraction around 40%.  The nuclear perfusion pattern had a fixed anteroseptal/apical defect consistent with a left bundle branch block but no ischemia. The echo suggested akinesis of the basal mid anteroseptal myocardium (also probably attributable to left bundle branch block). She had a normal echo in 2004.  She has a remote history of 40-pack-year smoking, quit last December.  She has treated hypertension.  She she is obese and has diabetes mellitus with good control (last hemoglobin A1c 6.5% on metformin only).  Reportedly has a history of hyperlipidemia.  Her most recent LDL cholesterol was 99 and she is currently not taking any lipid-lowering medications.  Her other lipid parameters are within desirable range.  She appears to have CKD stage III with a baseline creatinine around 1.3-1.5 and a GFR of 45-50.    She does have a family history of coronary artery disease, but not at young ages (sister died at age 37, mother had CHF died at age 20.).  Past Medical History:  Diagnosis Date  . Hypertension     Past Surgical History:    Procedure Laterality Date  . back surgery  1990   ruptured disk  . BACK SURGERY  2004   infection  . CHOLECYSTECTOMY OPEN  1972    Current Medications: Current Meds  Medication Sig  . amLODipine (NORVASC) 10 MG tablet Take 10 mg by mouth daily.  . Cholecalciferol (VITAMIN D3) 1000 units CAPS Take 2 capsules by mouth daily.  . furosemide (LASIX) 20 MG tablet Take 20 mg by mouth daily as needed.  Marland Kitchen guaiFENesin (MUCINEX) 600 MG 12 hr tablet Take 1,200 mg by mouth 2 (two) times daily as needed.  Marland Kitchen lisinopril-hydrochlorothiazide (PRINZIDE,ZESTORETIC) 20-12.5 MG tablet Take 2 tablets by mouth daily.  . metFORMIN (GLUCOPHAGE-XR) 750 MG 24 hr tablet Take 2 tablets by mouth daily. With evening meal     Allergies:   Patient has no known allergies.   Social History   Socioeconomic History  . Marital status: Married    Spouse name: Not on file  . Number of children: Not on file  . Years of education: Not on file  . Highest education level: Not on file  Occupational History  . Not on file  Social Needs  . Financial resource strain: Not on file  . Food insecurity:    Worry: Not on file    Inability: Not on file  . Transportation needs:    Medical: Not on file    Non-medical: Not on file  Tobacco Use  . Smoking status: Former  Smoker    Packs/day: 1.00    Years: 40.00    Pack years: 40.00    Types: Cigarettes    Last attempt to quit: 04/03/2017    Years since quitting: 0.7  . Smokeless tobacco: Never Used  Substance and Sexual Activity  . Alcohol use: Never    Frequency: Never  . Drug use: Never  . Sexual activity: Not on file  Lifestyle  . Physical activity:    Days per week: Not on file    Minutes per session: Not on file  . Stress: Not on file  Relationships  . Social connections:    Talks on phone: Not on file    Gets together: Not on file    Attends religious service: Not on file    Active member of club or organization: Not on file    Attends meetings of clubs  or organizations: Not on file    Relationship status: Not on file  Other Topics Concern  . Not on file  Social History Narrative  . Not on file     Family History: The patient's family history includes Cancer in her sister; Dementia in her mother; Diabetes in her brother; Heart attack in her brother and brother; Heart disease in her father; Heart failure in her mother.  ROS:   Please see the history of present illness.    All other systems are reviewed and are negative  EKGs/Labs/Other Studies Reviewed:    The following studies were reviewed today: Nuclear perfusion images, echo images.  EKG:  EKG is not ordered today.  The ekg ordered at her previous appointment demonstrates sinus rhythm, left bundle branch block (QRS 136 ms), QTC 495 ms   Physical Exam:    VS:  BP (!) 162/90   Pulse 98   Ht 5\' 4"  (1.626 m)   Wt 221 lb (100.2 kg)   BMI 37.93 kg/m     Wt Readings from Last 3 Encounters:  01/13/18 221 lb (100.2 kg)  01/10/18 218 lb (98.9 kg)  01/02/18 218 lb (98.9 kg)      General: Alert, oriented x3, no distress, severely obese, appears anxious Head: no evidence of trauma, PERRL, EOMI, no exophtalmos or lid lag, no myxedema, no xanthelasma; normal ears, nose and oropharynx Neck: normal jugular venous pulsations and no hepatojugular reflux; brisk carotid pulses without delay and no carotid bruits Chest: clear to auscultation, no signs of consolidation by percussion or palpation, normal fremitus, symmetrical and full respiratory excursions Cardiovascular: normal position and quality of the apical impulse, regular rhythm, normal first and second heart sounds, no murmurs, rubs or gallops Abdomen: no tenderness or distention, no masses by palpation, no abnormal pulsatility or arterial bruits, normal bowel sounds, no hepatosplenomegaly Extremities: no clubbing, cyanosis or edema; 2+ radial, ulnar and brachial pulses bilaterally; 2+ right femoral, posterior tibial and dorsalis  pedis pulses; 2+ left femoral, posterior tibial and dorsalis pedis pulses; no subclavian or femoral bruits Neurological: grossly nonfocal Psych: Normal mood and affect   ASSESSMENT:    1. Cardiomyopathy, unspecified type ([place])   2. LBBB (left bundle branch block)   3. Diabetes mellitus type 2 in obese ([place])   4. History of smoking   5. Essential hypertension   6. CKD (chronic kidney disease) stage 3, GFR 30-59 ml/min (HCC)   7. Abnormal nuclear stress test   8. Pre-procedural laboratory examination    PLAN:    In order of problems listed above:  1. Cardiomyopathy: Etiology of her  left ventricular systolic dysfunction is not certain.  Both the perfusion abnormality on the nuclear test and the wall motion abnormality on the echo can be explained by a left bundle branch block.  She has never had angina pectoris, but she does have numerous coronary risk factors including previous smoking, diabetes mellitus, hypertension.  Her last LDL cholesterol was 99, which is satisfactory in the setting of diabetes mellitus, which will be a target for treatment of previous cardiovascular problems.  I have recommended that she undergo cardiac catheterization to clarify the cause of her cardiomyopathy.  If significant CAD is identified, revascularization is indicated even if left ventricular dysfunction is not symptomatic.  Discussed the fact that we will have to change her antihypertensive medications.  She will hold her lisinopril-hydrochlorothiazide for the heart catheterization and will plan to switch her to an ARB such as valsartan after the procedure.  Ultimately would like to get her on Entresto if this is affordable.  She will also hold her furosemide and metformin for the heart catheterization.  We will plan to add carvedilol rather than amlodipine.  Discussed the fact that these medication changes will have to be done in a stepwise fashion. This diagnostic cardiac catheterization and possible percutaneous  revascularization with angioplasty/stent procedure has been fully reviewed with the patient and written informed consent has been obtained. 2. LBBB may explain the wall motion abnormality and fixed perfusion defect/artifact.  Might be an opportunity for biventricular pacing, especially if she has nonischemic cardiomyopathy.  At this point though she does not have a lot of symptoms of heart failure (possibly since she is very sedentary). 3. DM: Excellent control and would likely be "cured" if she lost substantial more weight. 4. Smoking cessation: Congratulated on not smoking for the last 10 months.  Need to work on getting her husband [name] to quit as well.  He was at the appointment today and we talked about that. 5. HTN: Rather than amlodipine, hydrochlorothiazide and lisinopril we will plan to use Entresto, carvedilol and furosemide for her cardiomyopathy.  We will gradually make these changes as an outpatient depending on the results of her cardiac catheterization and her kidney function. 6. CKD 2-3: Baseline creatinine seems to be 1.3-1.5.  Rechecking labs today.  Especially since she also has diabetes will need hydration before the cardiac catheterization.  Told her to stop her lisinopril-HCTZ, furosemide and metformin before the heart cath.   Medication Adjustments/Labs and Tests Ordered: Current medicines are reviewed at length with the patient today.  Concerns regarding medicines are outlined above.  Orders Placed This Encounter  Procedures  . Basic metabolic panel  . CBC   No orders of the defined types were placed in this encounter.   Patient Instructions   [place] [address] Dept: [phone] Loc: [phone]  Karen Cooper  01/13/2018  You are scheduled for a Cardiac Catheterization on Wednesday, October 9 with Dr. Harrell Gave End.  1. Please arrive at the  [facility] (Main Entrance A) at [facility]: [place], [place] [address] at 1:00 PM (This time is two hours before your procedure to ensure your preparation). Free valet parking service is available.   Special note: Every effort is made to have your procedure done on time. Please understand that emergencies sometimes delay scheduled procedures.  2. Diet: Do not eat solid foods after midnight.  The patient may have clear liquids  until 5am upon the day of the procedure.  3. Labs: You will need to have blood drawn on Friday, October 4 at [place]  Open: [place] (Lunch 12:30 - 1:30)   Phone: [phone]. You do not need to be fasting.  4. Medication instructions in preparation for your procedure: HOLD the following medications the morning of your procedure: Lisinopril-HCT Furosemide  Metformin - DO NOT RESTART for 48-hours AFTER your procedure  On the morning of your procedure, take your Aspirin 81 mg and any morning medicines NOT listed above.  You may use sips of water.  Contrast Allergy: No  5. Plan for one night stay--bring personal belongings.  6. Bring a current list of your medications and current insurance cards.  7. You MUST have a responsible person to drive you home.  8. Someone MUST be with you the first 24 hours after you arrive home or your discharge will be delayed.  9. Please wear clothes that are easy to get on and off and wear slip-on shoes.  Thank you for allowing [place] to care for you!   -- [place] [facility]    Signed, Sanda Klein, MD  01/13/2018 11:49 AM    [place]

## 2018-01-17 ENCOUNTER — Telehealth: Payer: Self-pay | Admitting: *Deleted

## 2018-01-17 NOTE — Telephone Encounter (Signed)
Pt contacted pre-catheterization scheduled at [facility] for: Wednesday January 17, 2018 3 PM Verified arrival time and place: [place] Entrance A at: 1 PM  No solid food after midnight prior to cath, clear liquids until 5 AM day of procedure. Verified no contrast allergy.  Hold: Metformin-day of procedure and 48 hours post procedure. Furosemide-day of procedure. Lisinopril -HCTZ-day of procedure.  Except hold medications AM meds can be  taken pre-cath with sip of water including: ASA 81 mg  Confirm patient has responsible person to drive home post procedure and for 24 hours after you arrive home: yes  I encouraged patient to drink water today for hydration pre-procedure.

## 2018-01-18 ENCOUNTER — Encounter (HOSPITAL_COMMUNITY)
Admission: RE | Disposition: A | Discharge status: Home / Self Care | Payer: Self-pay | Source: Ambulatory Visit | Attending: Cardiovascular Disease

## 2018-01-18 ENCOUNTER — Ambulatory Visit (HOSPITAL_COMMUNITY)
Admission: RE | Admit: 2018-01-18 | Discharge: 2018-01-19 | Disposition: A | Discharge status: Home / Self Care | Payer: Medicare Other | Source: Ambulatory Visit | Attending: Cardiovascular Disease | Admitting: Cardiovascular Disease

## 2018-01-18 ENCOUNTER — Encounter (HOSPITAL_COMMUNITY): Payer: Self-pay | Admitting: General Practice

## 2018-01-18 ENCOUNTER — Other Ambulatory Visit: Payer: Self-pay

## 2018-01-18 DIAGNOSIS — R9439 Abnormal result of other cardiovascular function study: Secondary | ICD-10-CM | POA: Diagnosis not present

## 2018-01-18 DIAGNOSIS — E1122 Type 2 diabetes mellitus with diabetic chronic kidney disease: Secondary | ICD-10-CM | POA: Insufficient documentation

## 2018-01-18 DIAGNOSIS — F329 Major depressive disorder, single episode, unspecified: Secondary | ICD-10-CM | POA: Insufficient documentation

## 2018-01-18 DIAGNOSIS — I447 Left bundle-branch block, unspecified: Secondary | ICD-10-CM | POA: Insufficient documentation

## 2018-01-18 DIAGNOSIS — I2510 Atherosclerotic heart disease of native coronary artery without angina pectoris: Secondary | ICD-10-CM | POA: Diagnosis not present

## 2018-01-18 DIAGNOSIS — I10 Essential (primary) hypertension: Secondary | ICD-10-CM

## 2018-01-18 DIAGNOSIS — E119 Type 2 diabetes mellitus without complications: Secondary | ICD-10-CM

## 2018-01-18 DIAGNOSIS — Z6837 Body mass index (BMI) 37.0-37.9, adult: Secondary | ICD-10-CM | POA: Insufficient documentation

## 2018-01-18 DIAGNOSIS — Z79899 Other long term (current) drug therapy: Secondary | ICD-10-CM | POA: Diagnosis not present

## 2018-01-18 DIAGNOSIS — Z720 Tobacco use: Secondary | ICD-10-CM

## 2018-01-18 DIAGNOSIS — Z8249 Family history of ischemic heart disease and other diseases of the circulatory system: Secondary | ICD-10-CM | POA: Insufficient documentation

## 2018-01-18 DIAGNOSIS — I255 Ischemic cardiomyopathy: Secondary | ICD-10-CM

## 2018-01-18 DIAGNOSIS — Z833 Family history of diabetes mellitus: Secondary | ICD-10-CM | POA: Insufficient documentation

## 2018-01-18 DIAGNOSIS — Z9049 Acquired absence of other specified parts of digestive tract: Secondary | ICD-10-CM | POA: Diagnosis not present

## 2018-01-18 DIAGNOSIS — Z955 Presence of coronary angioplasty implant and graft: Secondary | ICD-10-CM

## 2018-01-18 DIAGNOSIS — E785 Hyperlipidemia, unspecified: Secondary | ICD-10-CM | POA: Diagnosis not present

## 2018-01-18 DIAGNOSIS — Z9889 Other specified postprocedural states: Secondary | ICD-10-CM | POA: Diagnosis not present

## 2018-01-18 DIAGNOSIS — N183 Chronic kidney disease, stage 3 unspecified: Secondary | ICD-10-CM

## 2018-01-18 DIAGNOSIS — Z7984 Long term (current) use of oral hypoglycemic drugs: Secondary | ICD-10-CM | POA: Diagnosis not present

## 2018-01-18 DIAGNOSIS — E669 Obesity, unspecified: Secondary | ICD-10-CM | POA: Diagnosis not present

## 2018-01-18 DIAGNOSIS — I129 Hypertensive chronic kidney disease with stage 1 through stage 4 chronic kidney disease, or unspecified chronic kidney disease: Secondary | ICD-10-CM | POA: Insufficient documentation

## 2018-01-18 DIAGNOSIS — Z87891 Personal history of nicotine dependence: Secondary | ICD-10-CM | POA: Insufficient documentation

## 2018-01-18 HISTORY — PX: LEFT HEART CATH AND CORONARY ANGIOGRAPHY: CATH118249

## 2018-01-18 HISTORY — DX: Atherosclerotic heart disease of native coronary artery without angina pectoris: I25.10

## 2018-01-18 HISTORY — PX: CORONARY STENT INTERVENTION: CATH118234

## 2018-01-18 HISTORY — DX: Type 2 diabetes mellitus without complications: E11.9

## 2018-01-18 HISTORY — PX: CORONARY ANGIOPLASTY WITH STENT PLACEMENT: SHX49

## 2018-01-18 LAB — POCT ACTIVATED CLOTTING TIME
Activated Clotting Time: 279 seconds
Activated Clotting Time: 279 seconds

## 2018-01-18 LAB — GLUCOSE, CAPILLARY
GLUCOSE-CAPILLARY: 149 mg/dL — AB (ref 70–99)
Glucose-Capillary: 134 mg/dL — ABNORMAL HIGH (ref 70–99)

## 2018-01-18 SURGERY — LEFT HEART CATH AND CORONARY ANGIOGRAPHY
Anesthesia: LOCAL

## 2018-01-18 MED ORDER — FENTANYL CITRATE (PF) 100 MCG/2ML IJ SOLN
INTRAMUSCULAR | Status: AC
  Filled 2018-01-18: qty 2

## 2018-01-18 MED ORDER — HEPARIN SODIUM (PORCINE) 1000 UNIT/ML IJ SOLN
INTRAMUSCULAR | Status: AC
  Filled 2018-01-18: qty 1

## 2018-01-18 MED ORDER — SODIUM CHLORIDE 0.9 % IV SOLN: 250.0000 mL | INTRAVENOUS | Status: DC | PRN

## 2018-01-18 MED ORDER — MIDAZOLAM HCL 2 MG/2ML IJ SOLN
INTRAMUSCULAR | Status: AC
  Filled 2018-01-18: qty 2

## 2018-01-18 MED ORDER — SODIUM CHLORIDE 0.9 % IV SOLN
INTRAVENOUS | Status: DC
  Administered 2018-01-18: 14:00:00 via INTRAVENOUS

## 2018-01-18 MED ORDER — ATORVASTATIN CALCIUM 80 MG PO TABS
80.0000 mg | ORAL_TABLET | Freq: Every day | ORAL | Status: DC
  Administered 2018-01-18: 80 mg via ORAL
  Filled 2018-01-18: qty 1

## 2018-01-18 MED ORDER — ONDANSETRON HCL 4 MG/2ML IJ SOLN: 4.0000 mg | Freq: Four times a day (QID) | INTRAMUSCULAR | Status: DC | PRN

## 2018-01-18 MED ORDER — TICAGRELOR 90 MG PO TABS
ORAL_TABLET | ORAL | Status: DC | PRN
  Administered 2018-01-18: 180 mg via ORAL

## 2018-01-18 MED ORDER — ANGIOPLASTY BOOK
Freq: Once | Status: AC
  Administered 2018-01-18: 22:00:00
  Filled 2018-01-18: qty 1

## 2018-01-18 MED ORDER — ASPIRIN 81 MG PO CHEW
81.0000 mg | CHEWABLE_TABLET | Freq: Every day | ORAL | Status: DC
  Administered 2018-01-19: 81 mg via ORAL
  Filled 2018-01-18: qty 1

## 2018-01-18 MED ORDER — VERAPAMIL HCL 2.5 MG/ML IV SOLN
INTRAVENOUS | Status: AC
  Filled 2018-01-18: qty 2

## 2018-01-18 MED ORDER — SODIUM CHLORIDE 0.9 % IV SOLN: INTRAVENOUS | Status: DC

## 2018-01-18 MED ORDER — LIDOCAINE HCL (PF) 1 % IJ SOLN
INTRAMUSCULAR | Status: AC
  Filled 2018-01-18: qty 30

## 2018-01-18 MED ORDER — SODIUM CHLORIDE 0.9% FLUSH: 3.0000 mL | INTRAVENOUS | Status: DC | PRN

## 2018-01-18 MED ORDER — SODIUM CHLORIDE 0.9% FLUSH: 3.0000 mL | Freq: Two times a day (BID) | INTRAVENOUS | Status: DC

## 2018-01-18 MED ORDER — LIDOCAINE HCL (PF) 1 % IJ SOLN
INTRAMUSCULAR | Status: DC | PRN
  Administered 2018-01-18: 2 mL

## 2018-01-18 MED ORDER — DIAZEPAM 5 MG PO TABS: 5.0000 mg | ORAL_TABLET | Freq: Four times a day (QID) | ORAL | Status: DC | PRN

## 2018-01-18 MED ORDER — FENTANYL CITRATE (PF) 100 MCG/2ML IJ SOLN
INTRAMUSCULAR | Status: DC | PRN
  Administered 2018-01-18: 25 ug via INTRAVENOUS

## 2018-01-18 MED ORDER — SODIUM CHLORIDE 0.9 % IV SOLN: INTRAVENOUS | Status: AC

## 2018-01-18 MED ORDER — NITROGLYCERIN 1 MG/10 ML FOR IR/CATH LAB
INTRA_ARTERIAL | Status: DC | PRN
  Administered 2018-01-18 (×2): 200 ug via INTRACORONARY

## 2018-01-18 MED ORDER — NITROGLYCERIN 1 MG/10 ML FOR IR/CATH LAB
INTRA_ARTERIAL | Status: AC
  Filled 2018-01-18: qty 10

## 2018-01-18 MED ORDER — AMLODIPINE BESYLATE 10 MG PO TABS
10.0000 mg | ORAL_TABLET | Freq: Every day | ORAL | Status: DC
  Administered 2018-01-18 – 2018-01-19 (×2): 10 mg via ORAL
  Filled 2018-01-18 (×2): qty 1

## 2018-01-18 MED ORDER — VERAPAMIL HCL 2.5 MG/ML IV SOLN
INTRAVENOUS | Status: DC | PRN
  Administered 2018-01-18: 10 mL via INTRA_ARTERIAL

## 2018-01-18 MED ORDER — HEPARIN SODIUM (PORCINE) 1000 UNIT/ML IJ SOLN
INTRAMUSCULAR | Status: DC | PRN
  Administered 2018-01-18: 5000 [IU] via INTRAVENOUS
  Administered 2018-01-18: 6000 [IU] via INTRAVENOUS
  Administered 2018-01-18 (×2): 2000 [IU] via INTRAVENOUS

## 2018-01-18 MED ORDER — TICAGRELOR 90 MG PO TABS
90.0000 mg | ORAL_TABLET | Freq: Two times a day (BID) | ORAL | Status: DC
  Administered 2018-01-19: 90 mg via ORAL
  Filled 2018-01-18 (×2): qty 1

## 2018-01-18 MED ORDER — MIDAZOLAM HCL 2 MG/2ML IJ SOLN
INTRAMUSCULAR | Status: DC | PRN
  Administered 2018-01-18: 1 mg via INTRAVENOUS

## 2018-01-18 MED ORDER — INFLUENZA VAC SPLIT HIGH-DOSE 0.5 ML IM SUSY
0.5000 mL | PREFILLED_SYRINGE | INTRAMUSCULAR | Status: DC
  Filled 2018-01-18 (×2): qty 0.5

## 2018-01-18 MED ORDER — TICAGRELOR 90 MG PO TABS
ORAL_TABLET | ORAL | Status: AC
  Filled 2018-01-18: qty 2

## 2018-01-18 MED ORDER — HEPARIN (PORCINE) IN NACL 1000-0.9 UT/500ML-% IV SOLN
INTRAVENOUS | Status: DC | PRN
  Administered 2018-01-18 (×2): 500 mL

## 2018-01-18 MED ORDER — SODIUM CHLORIDE 0.9 % IV SOLN
INTRAVENOUS | Status: DC
  Administered 2018-01-18: 23:00:00 via INTRAVENOUS

## 2018-01-18 MED ORDER — ASPIRIN 81 MG PO CHEW: 81.0000 mg | CHEWABLE_TABLET | ORAL | Status: DC

## 2018-01-18 MED ORDER — ACETAMINOPHEN 325 MG PO TABS: 650.0000 mg | ORAL_TABLET | ORAL | Status: DC | PRN

## 2018-01-18 MED ORDER — HEPARIN (PORCINE) IN NACL 1000-0.9 UT/500ML-% IV SOLN
INTRAVENOUS | Status: AC
  Filled 2018-01-18: qty 1000

## 2018-01-18 MED ORDER — LABETALOL HCL 5 MG/ML IV SOLN: 10.0000 mg | INTRAVENOUS | Status: AC | PRN

## 2018-01-18 MED ORDER — HYDRALAZINE HCL 20 MG/ML IJ SOLN: 5.0000 mg | INTRAMUSCULAR | Status: AC | PRN

## 2018-01-18 MED ORDER — IOHEXOL 350 MG/ML SOLN
INTRAVENOUS | Status: DC | PRN
  Administered 2018-01-18: 120 mL via INTRA_ARTERIAL

## 2018-01-18 SURGICAL SUPPLY — 18 items
BALLN SAPPHIRE 2.5X12 (BALLOONS) ×2
BALLN SAPPHIRE ~~LOC~~ 3.25X12 (BALLOONS) ×1 IMPLANT
BALLOON SAPPHIRE 2.5X12 (BALLOONS) IMPLANT
CATH 5FR JL3.5 JR4 ANG PIG MP (CATHETERS) ×1 IMPLANT
CATH OPTITORQUE TIG 4.0 5F (CATHETERS) ×1 IMPLANT
CATH VISTA GUIDE 6FR XBLAD3.5 (CATHETERS) ×1 IMPLANT
DEVICE RAD COMP TR BAND LRG (VASCULAR PRODUCTS) ×1 IMPLANT
GLIDESHEATH SLEND SS 6F .021 (SHEATH) ×1 IMPLANT
GUIDEWIRE INQWIRE 1.5J.035X260 (WIRE) IMPLANT
INQWIRE 1.5J .035X260CM (WIRE) ×2
KIT ENCORE 26 ADVANTAGE (KITS) ×1 IMPLANT
KIT HEART LEFT (KITS) ×2 IMPLANT
PACK CARDIAC CATHETERIZATION (CUSTOM PROCEDURE TRAY) ×2 IMPLANT
STENT RESOLUTE ONYX 3.0X15 (Permanent Stent) ×1 IMPLANT
TRANSDUCER W/STOPCOCK (MISCELLANEOUS) ×2 IMPLANT
TUBING CIL FLEX 10 FLL-RA (TUBING) ×2 IMPLANT
WIRE ASAHI PROWATER 180CM (WIRE) ×1 IMPLANT
WIRE HI TORQ VERSACORE-J 145CM (WIRE) ×1 IMPLANT

## 2018-01-18 NOTE — Progress Notes (Signed)
TR BAND REMOVAL  LOCATION:    right radial  DEFLATED PER PROTOCOL:    Yes.    TIME BAND OFF / DRESSING APPLIED:    22:30   SITE UPON ARRIVAL:    Level 0  SITE AFTER BAND REMOVAL:    Level 0  CIRCULATION SENSATION AND MOVEMENT:    Within Normal Limits   Yes.    COMMENTS:   Post TR band instructions given. Pt tolerated well.

## 2018-01-18 NOTE — Interval H&P Note (Signed)
Cath Lab Visit (complete for each Cath Lab visit)  Clinical Evaluation Leading to the Procedure:   ACS: No.  Non-ACS:    Anginal Classification: CCS II  Anti-ischemic medical therapy: Minimal Therapy (1 class of medications)  Non-Invasive Test Results: Intermediate-risk stress test findings: cardiac mortality 1-3%/year  Prior CABG: No previous CABG      History and Physical Interval Note:  01/18/2018 5:24 PM  Karen Cooper  has presented today for surgery, with the diagnosis of Abnormal Stress Test  The various methods of treatment have been discussed with the patient and family. After consideration of risks, benefits and other options for treatment, the patient has consented to  Procedure(s): LEFT HEART CATH AND CORONARY ANGIOGRAPHY (N/A) as a surgical intervention .  The patient's history has been reviewed, patient examined, no change in status, stable for surgery.  I have reviewed the patient's chart and labs.  Questions were answered to the patient's satisfaction.     Shelva Majestic

## 2018-01-19 ENCOUNTER — Telehealth: Payer: Self-pay | Admitting: Physician Assistant

## 2018-01-19 ENCOUNTER — Encounter (HOSPITAL_COMMUNITY): Payer: Self-pay | Admitting: Cardiovascular Disease

## 2018-01-19 DIAGNOSIS — I10 Essential (primary) hypertension: Secondary | ICD-10-CM

## 2018-01-19 DIAGNOSIS — I2510 Atherosclerotic heart disease of native coronary artery without angina pectoris: Secondary | ICD-10-CM | POA: Diagnosis not present

## 2018-01-19 DIAGNOSIS — E669 Obesity, unspecified: Secondary | ICD-10-CM | POA: Diagnosis not present

## 2018-01-19 DIAGNOSIS — I129 Hypertensive chronic kidney disease with stage 1 through stage 4 chronic kidney disease, or unspecified chronic kidney disease: Secondary | ICD-10-CM | POA: Diagnosis not present

## 2018-01-19 DIAGNOSIS — Z79899 Other long term (current) drug therapy: Secondary | ICD-10-CM | POA: Diagnosis not present

## 2018-01-19 DIAGNOSIS — I447 Left bundle-branch block, unspecified: Secondary | ICD-10-CM | POA: Diagnosis not present

## 2018-01-19 DIAGNOSIS — N183 Chronic kidney disease, stage 3 unspecified: Secondary | ICD-10-CM

## 2018-01-19 DIAGNOSIS — I255 Ischemic cardiomyopathy: Secondary | ICD-10-CM

## 2018-01-19 DIAGNOSIS — Z7984 Long term (current) use of oral hypoglycemic drugs: Secondary | ICD-10-CM | POA: Diagnosis not present

## 2018-01-19 DIAGNOSIS — E119 Type 2 diabetes mellitus without complications: Secondary | ICD-10-CM

## 2018-01-19 DIAGNOSIS — E785 Hyperlipidemia, unspecified: Secondary | ICD-10-CM

## 2018-01-19 DIAGNOSIS — R9439 Abnormal result of other cardiovascular function study: Secondary | ICD-10-CM

## 2018-01-19 DIAGNOSIS — F329 Major depressive disorder, single episode, unspecified: Secondary | ICD-10-CM | POA: Diagnosis not present

## 2018-01-19 DIAGNOSIS — Z87891 Personal history of nicotine dependence: Secondary | ICD-10-CM | POA: Diagnosis not present

## 2018-01-19 DIAGNOSIS — E1122 Type 2 diabetes mellitus with diabetic chronic kidney disease: Secondary | ICD-10-CM | POA: Diagnosis not present

## 2018-01-19 DIAGNOSIS — Z720 Tobacco use: Secondary | ICD-10-CM

## 2018-01-19 LAB — GLUCOSE, CAPILLARY: Glucose-Capillary: 103 mg/dL — ABNORMAL HIGH (ref 70–99)

## 2018-01-19 LAB — BASIC METABOLIC PANEL
Anion gap: 7 (ref 5–15)
BUN: 16 mg/dL (ref 8–23)
CO2: 24 mmol/L (ref 22–32)
CREATININE: 1.04 mg/dL — AB (ref 0.44–1.00)
Calcium: 8.5 mg/dL — ABNORMAL LOW (ref 8.9–10.3)
Chloride: 107 mmol/L (ref 98–111)
GFR calc Af Amer: 60 mL/min — ABNORMAL LOW (ref >60)
GFR calc non Af Amer: 52 mL/min — ABNORMAL LOW (ref >60)
GLUCOSE: 115 mg/dL — AB (ref 70–99)
Potassium: 3.8 mmol/L (ref 3.5–5.1)
Sodium: 138 mmol/L (ref 135–145)

## 2018-01-19 LAB — CBC
HCT: 30.3 % — ABNORMAL LOW (ref 36.0–46.0)
Hemoglobin: 9.8 g/dL — ABNORMAL LOW (ref 12.0–15.0)
MCH: 29.3 pg (ref 26.0–34.0)
MCHC: 32.3 g/dL (ref 30.0–36.0)
MCV: 90.4 fL (ref 80.0–100.0)
PLATELETS: 273 10*3/uL (ref 150–400)
RBC: 3.35 MIL/uL — AB (ref 3.87–5.11)
RDW: 14.8 % (ref 11.5–15.5)
WBC: 11.1 10*3/uL — ABNORMAL HIGH (ref 4.0–10.5)
nRBC: 0 % (ref 0.0–0.2)

## 2018-01-19 MED ORDER — ATORVASTATIN CALCIUM 80 MG PO TABS: 80.0000 mg | ORAL_TABLET | Freq: Every day | ORAL | 3 refills | Status: DC

## 2018-01-19 MED ORDER — FUROSEMIDE 20 MG PO TABS: 20.0000 mg | ORAL_TABLET | Freq: Every day | ORAL | 3 refills | Status: DC

## 2018-01-19 MED ORDER — TICAGRELOR 90 MG PO TABS: 90.0000 mg | ORAL_TABLET | Freq: Two times a day (BID) | ORAL | 0 refills | Status: DC

## 2018-01-19 MED ORDER — ASPIRIN 81 MG PO CHEW: 81.0000 mg | CHEWABLE_TABLET | Freq: Every day | ORAL | 3 refills | Status: AC

## 2018-01-19 MED ORDER — NITROGLYCERIN 0.4 MG SL SUBL: 0.4000 mg | SUBLINGUAL_TABLET | SUBLINGUAL | 3 refills | Status: AC | PRN

## 2018-01-19 MED ORDER — TICAGRELOR 90 MG PO TABS: 90.0000 mg | ORAL_TABLET | Freq: Two times a day (BID) | ORAL | 3 refills | Status: DC

## 2018-01-19 MED ORDER — CARVEDILOL 6.25 MG PO TABS: 6.2500 mg | ORAL_TABLET | Freq: Two times a day (BID) | ORAL | 3 refills | Status: DC

## 2018-01-19 MED ORDER — VALSARTAN 80 MG PO TABS: 80.0000 mg | ORAL_TABLET | Freq: Every day | ORAL | 3 refills | Status: DC

## 2018-01-19 NOTE — Progress Notes (Signed)
Patient discharge done by another RN, them transported to main entrance to ride home without my knowledge.  Consequently, she did not get her flu vaccine as I was awaiting its arrival from pharmacy.  I called patient at home and spoke to her, so that she was clear that she did not receive this vaccine.

## 2018-01-19 NOTE — Telephone Encounter (Signed)
New Message   Patient has a TOC appt scheduled with Almyra Deforest on 10/24.

## 2018-01-19 NOTE — Progress Notes (Signed)
CARDIAC REHAB PHASE I   PRE:  Rate/Rhythm: 79 SR LBBB    BP: sitting 142/46    SaO2: 96 RA  MODE:  Ambulation: 200 ft   POST:  Rate/Rhythm: 106 ST with PVC    BP: sitting 169/78     SaO2: 99 RA  Pt weak, hadn't been out of bed. Initially held to IV pole but was able to walk independently after a few feet with gait belt for safety. It sounds like she doesn't walk much in general. Ed completed with good understanding. Understands the importance of Brilinta/ASA. Encouraged daily wts, low sodium, increasing activity/ex, and CRPII. Will send referral to [place].  [phone]   [place], ACSM 01/19/2018 9:30 AM

## 2018-01-19 NOTE — Discharge Instructions (Signed)

## 2018-01-19 NOTE — Telephone Encounter (Signed)
CURRENTLY ADMITTED

## 2018-01-19 NOTE — Care Management Note (Addendum)
Case Management Note  Patient Details  Name: Karen Cooper MRN: [id number] Date of Birth: 1945/09/02  Subjective/Objective:    From home , s/p stent intervention, will be on brilinta.  She has the 30 day savings coupon .  NCM awaiting benefit check , patient phone is [phone], if dc before benefit check comes back will call patient with information.  10/10 Karen Bamberger RN, BSN - NCM called patient she received her medications from the pharmacy, but she left the coupon , NCM contacted the pharmacy , gave him the coupon information and he was able to refund pateint's crerdit card for the 47.00  For the brilinta.                 Action/Plan: DC home when ready.  Expected Discharge Date:  01/19/18               Expected Discharge Plan:  Home/Self Care  In-House Referral:     Discharge planning Services  CM Consult, Medication Assistance  Post Acute Care Choice:    Choice offered to:     DME Arranged:    DME Agency:     HH Arranged:    HH Agency:     Status of Service:  Completed, signed off  If discussed at [employer] of Stay Meetings, dates discussed:    Additional Comments:  Karen Mayo, RN 01/19/2018, 9:34 AM

## 2018-01-19 NOTE — Discharge Summary (Addendum)
Discharge Summary    Patient ID: Karen Cooper,  MRN: [id number], DOB/AGE: [birth year] 74 y.o.  Admit date: 01/18/2018 Discharge date: 01/19/2018  Primary Care Provider: [facility] Primary Cardiologist: Sanda Klein, MD  Discharge Diagnoses    Principal Problem:   Abnormal nuclear stress test Active Problems:   CAD (coronary artery disease), native coronary artery   Essential hypertension   Dyslipidemia   Tobacco abuse   Ischemic cardiomyopathy   CKD (chronic kidney disease) stage 3, GFR 30-59 ml/min (HCC)   Diabetes type 2, controlled ([place])   Allergies No Known Allergies  Diagnostic Studies/Procedures    Left heart catheterization 01/18/18:  Mid RCA lesion is 30% stenosed.  Post intervention, there is a 0% residual stenosis.  Prox LAD to Mid LAD lesion is 80% stenosed.  A stent was successfully placed.   Predominant single-vessel CAD with 80% focal mid LAD stenosis after the takeoff of the first diagonal and septal perforating artery.  Normal ramus intermediate and left circumflex coronary artery.  Luminal irregularity of the RCA with smooth 30% mid stenosis.  LVEDP 10 mmHg.  Successful PCI to the mid LAD with ultimate insertion of a 3.0 x 15 mm Resolute Onyx DES stent postdilated to 3.29 mm with the 80% stenosis being reduced to 0%.  RECOMMENDATION: Medical therapy for concomitant CAD.  Initiation of high potency statin therapy.  Optimal blood pressure control with target blood pressure less than 130/80.  Recommend uninterrupted dual antiplatelet therapy with Aspirin 81mg  daily and Ticagrelor 90mg  twice daily for a minimum of 6 months (stable ischemic heart disease - Class I recommendation). _____________   History of Present Illness     72 y.o. female with a hx of systemic hypertension and remote history of smoking recently diagnosed with a left bundle branch block.  Reportedly this is a new abnormality compared to previous tracings,  but I cannot see those older ECGs.  Both her echocardiogram and the nuclear scintigram showed depressed left ventricular systolic function with an ejection fraction around 40%.  The nuclear perfusion pattern had a fixed anteroseptal/apical defect consistent with a left bundle branch block but no ischemia. The echo suggested akinesis of the basal mid anteroseptal myocardium (also probably attributable to left bundle branch block). She had a normal echo in 2004.  She has a remote history of 40-pack-year smoking, quit last December.  She has treated hypertension.  She is obese and has diabetes mellitus with good control (last hemoglobin A1c 6.5% on metformin only).  Reportedly has a history of hyperlipidemia.  Her most recent LDL cholesterol was 99 and she is currently not taking any lipid-lowering medications.  Her other lipid parameters are within desirable range.  She appears to have CKD stage III with a baseline creatinine around 1.3-1.5 and a GFR of 45-50.    She does have a family history of coronary artery disease, but not at young ages (sister died at age 7, mother had CHF died at age 29.).  Hospital Course     Consultants: None   1. CAD: patient presented for left heart catheterization after having an abnormal stress test. LHC 01/18/18 revealed proximal-mid LAD stenosis (80%) managed with PCI/DES, and 30% stenosis in the mid RCA. She was recommended for DAPT with aspirin and brilinta for a minimum of 6 months. Also recommended for risk factor modifications and started on high-intensity statin. Goal BP <130/80. Right radial cath site without significant hematoma or ecchymosis following cath. Cr stable at 1.04 following  cath.  - Continue aspirin, brillinta, and statin  2. Ischemic cardiomyopathy: Recent echo 01/10/18 with EF 35-40%. Amlodipine discontinued this admission. She was transitioned to carvedilol BID. Lisinopril-HCTZ discontinued this admission. She was transitioned to valsartan and  lasix.  - Continue carvedilol - titrate medication outpatient as tolerated - Continue valsartan - consider transition to entresto outpatient - Continue lasix - adjust dose outpatient as needed - Will need repeat Echo in 3 months following appropriate medical therapy  3. HTN: BP consistently above goal this admission. Home lisinopril-HCTZ was held in anticipation of LHC and transitioned to valsartan and lasix at discharge. Amlodipine discontinued given reduced EF and she was started on carvedilol at discharge.  - Continue carvedilol, valsartan, and lasix for now - adjust doses outpatient as tolerated.   4. DM type 2: maintained on ISS inpatient. Home metformin held for [facility].  - Patient instructed to resume metformin 01/21/18  5. CKD stage 2-3: Cr stable post cath; 1.04 on the day of discharge  6. Tobacco abuse: She quit smoking but husband continues to smoke in the house and car.  - Continue to encourage avoidance 2nd hand smoke. Hopeful husband will comply.  _____________  Discharge Vitals Blood pressure (!) 156/59, pulse 76, temperature 98.1 F (36.7 C), temperature source Oral, resp. rate (!) 23, height 5\' 4"  (1.626 m), weight 100.8 kg, SpO2 97 %.  Filed Weights   01/18/18 1700 01/18/18 1914 01/19/18 0600  Weight: 99.8 kg 99.7 kg 100.8 kg   Physical exam on the day of discharge GEN: No acute distress.   Neck: No JVD, no carotid bruits Cardiac:  RRR, no murmurs, rubs, or gallops. Right radial cath site with some mild oozing but no significant hematoma or ecchymosis  Respiratory: Clear to auscultation bilaterally, no wheezes/ rales/ rhonchi GI: NABS, Soft, nontender, non-distended  MS: No edema; No deformity. Neuro:  Nonfocal, moving all extremities spontaneously Psych: Normal affect      Labs & Radiologic Studies    CBC Recent Labs    01/19/18 0234  WBC 11.1*  HGB 9.8*  HCT 30.3*  MCV 90.4  PLT 638   Basic Metabolic Panel Recent Labs    01/19/18 0234  NA 138  K  3.8  CL 107  CO2 24  GLUCOSE 115*  BUN 16  CREATININE 1.04*  CALCIUM 8.5*   Liver Function Tests No results for input(s): AST, ALT, ALKPHOS, BILITOT, PROT, ALBUMIN in the last 72 hours. No results for input(s): LIPASE, AMYLASE in the last 72 hours. Cardiac Enzymes No results for input(s): CKTOTAL, CKMB, CKMBINDEX, TROPONINI in the last 72 hours. BNP Invalid input(s): POCBNP D-Dimer No results for input(s): DDIMER in the last 72 hours. Hemoglobin A1C No results for input(s): HGBA1C in the last 72 hours. Fasting Lipid Panel No results for input(s): CHOL, HDL, LDLCALC, TRIG, CHOLHDL, LDLDIRECT in the last 72 hours. Thyroid Function Tests No results for input(s): TSH, T4TOTAL, T3FREE, THYROIDAB in the last 72 hours.  Invalid input(s): FREET3 _____________  No results found. Disposition   Patient was seen and examined by Dr. Sallyanne Kuster who deemed patient as stable for discharge. Follow-up has been arranged. Discharge medications as listed below.   Follow-up Plans & Appointments    Follow-up Information    Almyra Deforest, [place] Follow up on 02/02/2018.   Specialties:  Cardiology, Radiology Why:  Please arrive 15 minutes early for your 10:00am post-hospital cardiology follow-up appointment Contact information: [address] [place] [address] [phone]  Discharge Instructions    AMB Referral to Cardiac Rehabilitation - Phase II   Complete by:  As directed    Diagnosis:  Coronary Stents      Discharge Medications   Allergies as of 01/19/2018   No Known Allergies     Medication List    STOP taking these medications   amLODipine 10 MG tablet Commonly known as:  NORVASC   lisinopril-hydrochlorothiazide 20-12.5 MG tablet Commonly known as:  PRINZIDE,ZESTORETIC     TAKE these medications   aspirin 81 MG chewable tablet Chew 1 tablet (81 mg total) by mouth daily.   atorvastatin 80 MG tablet Commonly known as:  LIPITOR Take 1 tablet (80  mg total) by mouth daily at 6 PM.   carvedilol 6.25 MG tablet Commonly known as:  COREG Take 1 tablet (6.25 mg total) by mouth 2 (two) times daily.   furosemide 20 MG tablet Commonly known as:  LASIX Take 1 tablet (20 mg total) by mouth daily.   metFORMIN 750 MG 24 hr tablet Commonly known as:  GLUCOPHAGE-XR Take 1,500 mg by mouth daily with supper. With evening meal Notes to patient:  You can restart this medication on 01/21/18.    nitroGLYCERIN 0.4 MG SL tablet Commonly known as:  NITROSTAT Place 1 tablet (0.4 mg total) under the tongue every 5 (five) minutes as needed for chest pain.   ticagrelor 90 MG Tabs tablet Commonly known as:  BRILINTA Take 1 tablet (90 mg total) by mouth 2 (two) times daily.   ticagrelor 90 MG Tabs tablet Commonly known as:  BRILINTA Take 1 tablet (90 mg total) by mouth 2 (two) times daily.   valsartan 80 MG tablet Commonly known as:  DIOVAN Take 1 tablet (80 mg total) by mouth daily.   Vitamin D3 1000 units Caps Take 2,000 Units by mouth daily.        Aspirin prescribed at discharge?  Yes High Intensity Statin Prescribed? (Lipitor 40-80mg  or Crestor 20-40mg ): Yes Beta Blocker Prescribed? Yes For EF <40%, was ACEI/ARB Prescribed? Yes ADP Receptor Inhibitor Prescribed? (i.e. Plavix etc.-Includes Medically Managed Patients): Yes For EF <40%, Aldosterone Inhibitor Prescribed? No: Consider adding outpatient if Cr/BP will tolerate Was EF assessed during THIS hospitalization? No: Assessed prior to admission Was Cardiac Rehab II ordered? (Included Medically managed Patients): Yes   Outstanding Labs/Studies   None  Duration of Discharge Encounter   Greater than 30 minutes including physician time.  Signed, Abigail Butts PA-C 01/19/2018, 9:07 AM    I have seen and examined the patient along with Abigail Butts PA-C.  I have reviewed the chart, notes and new data.  I agree with PA/NP's note.  Key new complaints: Feels well, legs  get weak when she ambulates in the hall but denies angina or dyspnea. Key examination changes: Small ecchymosis/tiny hematoma at right radial artery cardiac cath site, no active bleeding, good vascular supply to the hand and fingers. Key new findings / data: Angiography findings reviewed  PLAN: Mandatory dual antiplatelet therapy for the next 6 months, preferably 12 months. As outlined in her preop note, plan to revamp physical antihypertensive regimen preferring carvedilol and ARB to amlodipine and lisinopril-hydrochlorothiazide.  If affordable, plan transition to [facility] eventually.  Reevaluate LVEF in 3 months by echo.  Target LDL-see less than 70, on statin.  She has quit smoking, hopefully her husband will quit as well. With multiple medication changes, will need an early office visit for transition of care.  If blood pressure  allows, plan to titrate up the dose of her ARB at that visit.  Sanda Klein, MD, [place] [phone] 01/19/2018, 10:19 AM

## 2018-01-19 NOTE — Care Management (Signed)
#    1.    S/W  LASHANDA   @ [facility]  # [phone]  BRILINTA  90  MG  BID COVER- YES CO-PAY- $ 141.00 TIER- 3 DRUG PRIOR APPROVAL- NO  NO DEDUCTIBLE PREFERRED PHARMACY : YES   [facility]

## 2018-01-20 ENCOUNTER — Telehealth (HOSPITAL_COMMUNITY): Payer: Self-pay

## 2018-01-20 NOTE — Telephone Encounter (Signed)
Pt insurance is active and benefits verified through Medicare A/B. Co-pay $0.00, DED $185.00/$185.00 met, out of pocket $0.00/$0.00 met, co-insurance 20%. No pre-authorization. [employer], 01/20/18 @ 8:40AM, REF# [id number]  2ndary insurance is active and benefits verified through [employer]. Co-pay $0.00, DED $0.00/$0.00 met, out of pocket $0.00/$0.00 met, co-insurance 20%. No pre-authorization. [place], 01/20/18 @ 8:44AM, REF# [id number]  Will contact patient to see if she is interested in the Cardiac Rehab Program. If interested, patient will need to complete follow up appt. Once completed, patient will be contacted for scheduling upon review by the RN Navigator.

## 2018-01-20 NOTE — Telephone Encounter (Signed)
Called patient to see if she was interested in participating in the Cardiac Rehab Program. Patient stated not at this time.  Closed referral

## 2018-01-20 NOTE — Telephone Encounter (Signed)
Patient contacted regarding discharge from [facility] on 01/20/18.  Patient understands to follow up with provider Almyra Deforest, Why on 02/02/18 at 10 am at [facility]. Patient understands discharge instructions? yes Patient understands medications and regiment? yes Patient understands to bring all medications to this visit? yes

## 2018-02-02 ENCOUNTER — Encounter: Payer: Self-pay | Admitting: Physician Assistant

## 2018-02-02 ENCOUNTER — Other Ambulatory Visit: Payer: Self-pay

## 2018-02-02 ENCOUNTER — Ambulatory Visit (INDEPENDENT_AMBULATORY_CARE_PROVIDER_SITE_OTHER): Payer: Medicare Other | Admitting: Physician Assistant

## 2018-02-02 VITALS — BP 162/82 | HR 71 | Ht 64.0 in | Wt 215.0 lb

## 2018-02-02 DIAGNOSIS — I2510 Atherosclerotic heart disease of native coronary artery without angina pectoris: Secondary | ICD-10-CM | POA: Diagnosis not present

## 2018-02-02 DIAGNOSIS — I5022 Chronic systolic (congestive) heart failure: Secondary | ICD-10-CM

## 2018-02-02 DIAGNOSIS — N183 Chronic kidney disease, stage 3 unspecified: Secondary | ICD-10-CM

## 2018-02-02 DIAGNOSIS — I447 Left bundle-branch block, unspecified: Secondary | ICD-10-CM

## 2018-02-02 DIAGNOSIS — I10 Essential (primary) hypertension: Secondary | ICD-10-CM

## 2018-02-02 MED ORDER — SACUBITRIL-VALSARTAN 49-51 MG PO TABS: 1.0000 | ORAL_TABLET | Freq: Two times a day (BID) | ORAL | 2 refills | Status: DC

## 2018-02-02 NOTE — Patient Instructions (Addendum)
Medication Instructions:  Stop Valsartan Start Entresto 49-51 mg twice daily, you can began this morning Please Honor Card patient is presenting for Carmie Kanner: [id number]; Juanna Cao: [phone]; FYTWK: [id number]; ISSUER: [id number] ID: Pharmacy to complete   If you need a refill on your cardiac medications before your next appointment, please call your pharmacy.   Lab work: BMET in 1 week Fasting Lipid and Hepatic in 6-8 weeks. If you have labs (blood work) drawn today and your tests are completely normal, you will receive your results only by: Marland Kitchen MyChart Message (if you have MyChart) OR . A paper copy in the mail If you have any lab test that is abnormal or we need to change your treatment, we will call you to review the results.  Testing/Procedures: None Ordered.  Follow-Up: At [facility], you and your health needs are our priority.  As part of our continuing mission to provide you with exceptional heart care, we have created designated Provider Care Teams.  These Care Teams include your primary Cardiologist (physician) and Advanced Practice Providers (APPs -  Physician Assistants and Nurse Practitioners) who all work together to provide you with the care you need, when you need it. . Follow up with Almyra Deforest, PA-C in 2 weeks.  Any Other Special Instructions Will Be Listed Below (If Applicable). Take blood pressure twice daily, bring log with you to the next follow up visit in 2 weeks.

## 2018-02-02 NOTE — Progress Notes (Signed)
Cardiology Office Note    Date:  02/02/2018   ID:  Karen Cooper, DOB [birth year], MRN [id number]  PCP:  Holland Commons, FNP  Cardiologist:  Dr. Sallyanne Kuster   Chief Complaint  Patient presents with  . Follow-up    some lightheadedness  . Shortness of Breath    denies chest pain    History of Present Illness:  Karen Cooper is a 72 y.o. female with PMH of CKD stage III, HTN and remote history of smoking was recently noted to have new left bundle branch block.  Both her echo and Myoview showed decreased LV systolic function with EF of around 40%.  Myoview also showed a fixed anteroseptal and apical defect consistent with left bundle branch block but no ischemia.  Echo suggested akinesis of the basal mid anteroseptal myocardium.  To further assess, she underwent cardiac catheterization on 01/18/2018 which showed single-vessel CAD with 80% focal mid LAD stenosis treated with 3.0 x 15 mm resolute Onyx DES postdilated to 3.29 mm.  Patient was placed on high-dose statin.  Note, patient has quit smoking since December 2018  Patient presents today for cardiology office visit.  She denies any recent chest pain or shortness of breath.  Based on physical exam, she likely has COPD due to years of smoking.  She has never formally have this tested.  I will defer to primary care provider to consider a pulmonary function test.  I did congratulate her on quit smoking.  Her husband continue to smoke both in the house and in the car as well, but patient is determined to quit.  I will switch her valsartan to Entresto 49-51 mg twice daily.  She will need a basic metabolic panel in 1 week and follow-up with me in 2 weeks for up titration of heart failure medication.  Once her heart failure medication is fully titrated, I will set the patient up to repeat echocardiogram in 3 months.  Otherwise she will need a fasting lipid panel and LFT in 6 to 8 weeks.  Her blood pressure is high however she has not taken her  carvedilol or her valsartan.  I encouraged her to keep a blood pressure diary with 2 readings per day and also bring her blood pressure cuff with her to the next office visit.  Overall she is doing very well from her perspective.  She has been compliant with her aspirin and Brilinta.  Emphasis has been placed on compliance with dual antiplatelet therapy with the new LAD stent.    Past Medical History:  Diagnosis Date  . Coronary artery disease   . Hypertension   . Type II diabetes mellitus ([place])     Past Surgical History:  Procedure Laterality Date  . BACK SURGERY    . CHOLECYSTECTOMY OPEN  1972  . CORONARY ANGIOPLASTY WITH STENT PLACEMENT  01/18/2018  . CORONARY STENT INTERVENTION N/A 01/18/2018   Procedure: CORONARY STENT INTERVENTION;  Surgeon: Troy Sine, MD;  Location: [facility];  Service: Cardiovascular;  Laterality: N/A;  . INCISION AND DRAINAGE  2004   "infection had settled in my back"  . LEFT HEART CATH AND CORONARY ANGIOGRAPHY N/A 01/18/2018   Procedure: LEFT HEART CATH AND CORONARY ANGIOGRAPHY;  Surgeon: Troy Sine, MD;  Location: [facility];  Service: Cardiovascular;  Laterality: N/A;  . [place]   ruptured disk  . TONSILLECTOMY      Current Medications: Outpatient Medications Prior to Visit  Medication Sig Dispense Refill  . aspirin 81 MG chewable tablet Chew 1 tablet (81 mg total) by mouth daily. 90 tablet 3  . atorvastatin (LIPITOR) 80 MG tablet Take 1 tablet (80 mg total) by mouth daily at 6 PM. 90 tablet 3  . carvedilol (COREG) 6.25 MG tablet Take 1 tablet (6.25 mg total) by mouth 2 (two) times daily. 60 tablet 3  . Cholecalciferol (VITAMIN D3) 1000 units CAPS Take 2,000 Units by mouth daily.     . furosemide (LASIX) 20 MG tablet Take 1 tablet (20 mg total) by mouth daily. 30 tablet 3  . metFORMIN (GLUCOPHAGE-XR) 750 MG 24 hr tablet Take 1,500 mg by mouth daily with supper. With evening meal  0  . nitroGLYCERIN  (NITROSTAT) 0.4 MG SL tablet Place 1 tablet (0.4 mg total) under the tongue every 5 (five) minutes as needed for chest pain. 25 tablet 3  . ticagrelor (BRILINTA) 90 MG TABS tablet Take 1 tablet (90 mg total) by mouth 2 (two) times daily. 60 tablet 0  . ticagrelor (BRILINTA) 90 MG TABS tablet Take 1 tablet (90 mg total) by mouth 2 (two) times daily. 180 tablet 3  . valsartan (DIOVAN) 80 MG tablet Take 1 tablet (80 mg total) by mouth daily. 30 tablet 3   No facility-administered medications prior to visit.      Allergies:   Patient has no known allergies.   Social History   Socioeconomic History  . Marital status: Married    Spouse name: Not on file  . Number of children: Not on file  . Years of education: Not on file  . Highest education level: Not on file  Occupational History  . Not on file  Social Needs  . Financial resource strain: Not on file  . Food insecurity:    Worry: Not on file    Inability: Not on file  . Transportation needs:    Medical: Not on file    Non-medical: Not on file  Tobacco Use  . Smoking status: Former Smoker    Packs/day: 1.00    Years: 40.00    Pack years: 40.00    Types: Cigarettes    Last attempt to quit: 04/03/2017    Years since quitting: 0.8  . Smokeless tobacco: Never Used  Substance and Sexual Activity  . Alcohol use: Never    Frequency: Never  . Drug use: Never  . Sexual activity: Not Currently  Lifestyle  . Physical activity:    Days per week: Not on file    Minutes per session: Not on file  . Stress: Not on file  Relationships  . Social connections:    Talks on phone: Not on file    Gets together: Not on file    Attends religious service: Not on file    Active member of club or organization: Not on file    Attends meetings of clubs or organizations: Not on file    Relationship status: Not on file  Other Topics Concern  . Not on file  Social History Narrative  . Not on file     Family History:  The patient's family  history includes Cancer in her sister; Dementia in her mother; Diabetes in her brother; Heart attack in her brother and brother; Heart disease in her father; Heart failure in her mother.   ROS:   Please see the history of present illness.    ROS All other systems reviewed and are negative.   PHYSICAL EXAM:  VS:  BP (!) 162/82 (BP Location: Left Arm, Patient Position: Sitting, Cuff Size: Large)   Pulse 71   Ht 5\' 4"  (1.626 m)   Wt 215 lb (97.5 kg)   SpO2 97%   BMI 36.90 kg/m    GEN: Well nourished, well developed, in no acute distress  HEENT: normal  Neck: no JVD, carotid bruits, or masses Cardiac: RRR; no murmurs, rubs, or gallops,no edema  Respiratory: Diffusely diminished breath sounds bilaterally GI: soft, nontender, nondistended, + BS MS: no deformity or atrophy  Skin: warm and dry, no rash Neuro:  Alert and Oriented x 3, Strength and sensation are intact Psych: euthymic mood, full affect  Wt Readings from Last 3 Encounters:  02/02/18 215 lb (97.5 kg)  01/19/18 222 lb 3.6 oz (100.8 kg)  01/13/18 221 lb (100.2 kg)      Studies/Labs Reviewed:   EKG:  EKG is ordered today.  The ekg ordered today demonstrates normal sinus rhythm with left bundle branch block,.  Recent Labs: 01/19/2018: BUN 16; Creatinine, Ser 1.04; Hemoglobin 9.8; Platelets 273; Potassium 3.8; Sodium 138   Lipid Panel No results found for: CHOL, TRIG, HDL, CHOLHDL, VLDL, LDLCALC, LDLDIRECT  Additional studies/ records that were reviewed today include:   Echo 01/10/2018 LV EF: 35% -   40% Study Conclusions  - Left ventricle: The cavity size was normal. There was mild focal   basal hypertrophy of the septum. Systolic function was moderately   reduced. The estimated ejection fraction was in the range of 35%   to 40%. Diffuse hypokinesis. There is akinesis of the   basal-midanteroseptal myocardium. The study is not technically   sufficient to allow evaluation of LV diastolic function. Frequent    ectopy. - Ventricular septum: Septal motion showed abnormal function,   dyssynergy - Aortic valve: Trileaflet; mildly thickened, mildly calcified   leaflets. There was trivial regurgitation. - Pulmonary arteries: Systolic pressure was mildly increased.    Cath 01/18/2018  Mid RCA lesion is 30% stenosed.  Post intervention, there is a 0% residual stenosis.  Prox LAD to Mid LAD lesion is 80% stenosed.  A stent was successfully placed.   Predominant single-vessel CAD with 80% focal mid LAD stenosis after the takeoff of the first diagonal and septal perforating artery.  Normal ramus intermediate and left circumflex coronary artery.  Luminal irregularity of the RCA with smooth 30% mid stenosis.  LVEDP 10 mmHg.  Successful PCI to the mid LAD with ultimate insertion of a 3.0 x 15 mm Resolute Onyx DES stent postdilated to 3.29 mm with the 80% stenosis being reduced to 0%.  RECOMMENDATION: Medical therapy for concomitant CAD.  Initiation of high potency statin therapy.  Optimal blood pressure control with target blood pressure less than 130/80.    ASSESSMENT:    1. Chronic systolic heart failure ([facility])   2. CKD (chronic kidney disease), stage III ([place])   3. Essential hypertension   4. LBBB (left bundle branch block)   5. Coronary artery disease involving native coronary artery of native heart without angina pectoris      PLAN:  In order of problems listed above:  1. Chronic systolic heart failure: Underwent PCI of proximal to mid LAD.  I switched her valsartan to Entresto.  I will bring her back in 2 weeks for reassessment.  Once heart failure therapy has been maximized, I will obtain a 66-month repeat echocardiogram before her next visit with Dr. Sallyanne Kuster.  Otherwise she appears to be euvolemic on exam.  Given the need for [facility], she will need to obtain basic metabolic panel in 1 week before follow-up in 2 weeks.  2. Hypertension: Blood pressure is elevated today,  however patient did not take her blood pressure medications this morning.  I switched her valsartan to Entresto 49-51 mg twice daily  3. CAD: Continue aspirin and Brilinta.  She has quit smoking.  Continue high-dose Lipitor  4. Hyperlipidemia: LDL goal less than 70, repeat fasting lipid panel and LFTs in 6 to 8 weeks   5. CKD stage III: Follow renal function.  May need to scale back on diuretic after switching to [facility].    Medication Adjustments/Labs and Tests Ordered: Current medicines are reviewed at length with the patient today.  Concerns regarding medicines are outlined above.  Medication changes, Labs and Tests ordered today are listed in the Patient Instructions below. Patient Instructions  Medication Instructions:  Stop Valsartan Start Entresto 49-51 mg twice daily, you can began this morning Please Honor Card patient is presenting for Carmie Kanner: [id number]; Juanna Cao: [id number]; YBFXO: [id number]; ISSUER: [id number] ID: Pharmacy to complete   If you need a refill on your cardiac medications before your next appointment, please call your pharmacy.   Lab work: BMET in 1 week Fasting Lipid and Hepatic in 6-8 weeks. If you have labs (blood work) drawn today and your tests are completely normal, you will receive your results only by: Marland Kitchen MyChart Message (if you have MyChart) OR . A paper copy in the mail If you have any lab test that is abnormal or we need to change your treatment, we will call you to review the results.  Testing/Procedures: None Ordered.  Follow-Up: At [facility], you and your health needs are our priority.  As part of our continuing mission to provide you with exceptional heart care, we have created designated Provider Care Teams.  These Care Teams include your primary Cardiologist (physician) and Advanced Practice Providers (APPs -  Physician Assistants and Nurse Practitioners) who all work together to provide you with the care you need, when you need it. . Follow up  with Almyra Deforest, PA-C in 2 weeks.  Any Other Special Instructions Will Be Listed Below (If Applicable). Take blood pressure twice daily, bring log with you to the next follow up visit in 2 weeks.      Hilbert Corrigan, [place]  02/02/2018 1:26 PM    [facility] [place], [place], [place]  [address] Phone: [phone]; Fax: [phone]

## 2018-02-07 NOTE — Progress Notes (Signed)
Thanks, I agree. MCr

## 2018-02-10 ENCOUNTER — Other Ambulatory Visit: Payer: Self-pay

## 2018-02-10 DIAGNOSIS — I5022 Chronic systolic (congestive) heart failure: Secondary | ICD-10-CM | POA: Diagnosis not present

## 2018-02-10 LAB — HEPATIC FUNCTION PANEL
ALBUMIN: 3.8 g/dL (ref 3.5–4.8)
ALT: 10 IU/L (ref 0–32)
AST: 16 IU/L (ref 0–40)
Alkaline Phosphatase: 92 IU/L (ref 39–117)
BILIRUBIN, DIRECT: 0.18 mg/dL (ref 0.00–0.40)
Bilirubin Total: 0.5 mg/dL (ref 0.0–1.2)
TOTAL PROTEIN: 6.1 g/dL (ref 6.0–8.5)

## 2018-02-10 LAB — BASIC METABOLIC PANEL
BUN / CREAT RATIO: 21 (ref 12–28)
BUN: 32 mg/dL — ABNORMAL HIGH (ref 8–27)
CHLORIDE: 102 mmol/L (ref 96–106)
CO2: 24 mmol/L (ref 20–29)
Calcium: 8.6 mg/dL — ABNORMAL LOW (ref 8.7–10.3)
Creatinine, Ser: 1.54 mg/dL — ABNORMAL HIGH (ref 0.57–1.00)
GFR calc Af Amer: 38 mL/min/{1.73_m2} — ABNORMAL LOW (ref >59)
GFR calc non Af Amer: 33 mL/min/{1.73_m2} — ABNORMAL LOW (ref >59)
GLUCOSE: 184 mg/dL — AB (ref 65–99)
POTASSIUM: 4.7 mmol/L (ref 3.5–5.2)
SODIUM: 140 mmol/L (ref 134–144)

## 2018-02-10 LAB — LIPID PANEL
Chol/HDL Ratio: 2.3 ratio (ref 0.0–4.4)
Cholesterol, Total: 78 mg/dL — ABNORMAL LOW (ref 100–199)
HDL: 34 mg/dL — AB (ref >39)
LDL Calculated: 18 mg/dL (ref 0–99)
Triglycerides: 131 mg/dL (ref 0–149)
VLDL Cholesterol Cal: 26 mg/dL (ref 5–40)

## 2018-02-17 ENCOUNTER — Other Ambulatory Visit: Payer: Self-pay

## 2018-02-17 DIAGNOSIS — I2510 Atherosclerotic heart disease of native coronary artery without angina pectoris: Secondary | ICD-10-CM

## 2018-02-21 ENCOUNTER — Ambulatory Visit (INDEPENDENT_AMBULATORY_CARE_PROVIDER_SITE_OTHER): Payer: Medicare Other | Admitting: Physician Assistant

## 2018-02-21 ENCOUNTER — Encounter (HOSPITAL_COMMUNITY): Payer: Self-pay

## 2018-02-21 ENCOUNTER — Inpatient Hospital Stay (HOSPITAL_COMMUNITY)
Admission: EM | Admit: 2018-02-21 | Discharge: 2018-02-23 | DRG: 378 | Disposition: A | Discharge status: Home / Self Care | Payer: Medicare Other | Attending: Internal Medicine | Admitting: Internal Medicine

## 2018-02-21 ENCOUNTER — Other Ambulatory Visit: Payer: Self-pay

## 2018-02-21 ENCOUNTER — Encounter: Payer: Self-pay | Admitting: Physician Assistant

## 2018-02-21 ENCOUNTER — Telehealth: Payer: Self-pay

## 2018-02-21 VITALS — BP 162/82 | HR 86 | Resp 16 | Ht 64.0 in | Wt 213.4 lb

## 2018-02-21 DIAGNOSIS — E118 Type 2 diabetes mellitus with unspecified complications: Secondary | ICD-10-CM

## 2018-02-21 DIAGNOSIS — I2510 Atherosclerotic heart disease of native coronary artery without angina pectoris: Secondary | ICD-10-CM

## 2018-02-21 DIAGNOSIS — E785 Hyperlipidemia, unspecified: Secondary | ICD-10-CM | POA: Diagnosis present

## 2018-02-21 DIAGNOSIS — N183 Chronic kidney disease, stage 3 unspecified: Secondary | ICD-10-CM | POA: Diagnosis present

## 2018-02-21 DIAGNOSIS — K922 Gastrointestinal hemorrhage, unspecified: Secondary | ICD-10-CM | POA: Diagnosis not present

## 2018-02-21 DIAGNOSIS — I255 Ischemic cardiomyopathy: Secondary | ICD-10-CM | POA: Diagnosis present

## 2018-02-21 DIAGNOSIS — Z833 Family history of diabetes mellitus: Secondary | ICD-10-CM

## 2018-02-21 DIAGNOSIS — N179 Acute kidney failure, unspecified: Secondary | ICD-10-CM | POA: Diagnosis not present

## 2018-02-21 DIAGNOSIS — Z87891 Personal history of nicotine dependence: Secondary | ICD-10-CM | POA: Diagnosis not present

## 2018-02-21 DIAGNOSIS — E1122 Type 2 diabetes mellitus with diabetic chronic kidney disease: Secondary | ICD-10-CM | POA: Diagnosis present

## 2018-02-21 DIAGNOSIS — Z7902 Long term (current) use of antithrombotics/antiplatelets: Secondary | ICD-10-CM

## 2018-02-21 DIAGNOSIS — D62 Acute posthemorrhagic anemia: Secondary | ICD-10-CM | POA: Diagnosis present

## 2018-02-21 DIAGNOSIS — Z7984 Long term (current) use of oral hypoglycemic drugs: Secondary | ICD-10-CM

## 2018-02-21 DIAGNOSIS — K259 Gastric ulcer, unspecified as acute or chronic, without hemorrhage or perforation: Secondary | ICD-10-CM | POA: Diagnosis not present

## 2018-02-21 DIAGNOSIS — Z23 Encounter for immunization: Secondary | ICD-10-CM | POA: Diagnosis not present

## 2018-02-21 DIAGNOSIS — N289 Disorder of kidney and ureter, unspecified: Secondary | ICD-10-CM | POA: Diagnosis not present

## 2018-02-21 DIAGNOSIS — I10 Essential (primary) hypertension: Secondary | ICD-10-CM | POA: Diagnosis present

## 2018-02-21 DIAGNOSIS — F17201 Nicotine dependence, unspecified, in remission: Secondary | ICD-10-CM | POA: Diagnosis not present

## 2018-02-21 DIAGNOSIS — Z9049 Acquired absence of other specified parts of digestive tract: Secondary | ICD-10-CM

## 2018-02-21 DIAGNOSIS — Z7982 Long term (current) use of aspirin: Secondary | ICD-10-CM | POA: Diagnosis not present

## 2018-02-21 DIAGNOSIS — I447 Left bundle-branch block, unspecified: Secondary | ICD-10-CM | POA: Diagnosis present

## 2018-02-21 DIAGNOSIS — Z6836 Body mass index (BMI) 36.0-36.9, adult: Secondary | ICD-10-CM | POA: Diagnosis not present

## 2018-02-21 DIAGNOSIS — K921 Melena: Secondary | ICD-10-CM | POA: Diagnosis not present

## 2018-02-21 DIAGNOSIS — I129 Hypertensive chronic kidney disease with stage 1 through stage 4 chronic kidney disease, or unspecified chronic kidney disease: Secondary | ICD-10-CM | POA: Diagnosis present

## 2018-02-21 DIAGNOSIS — E1165 Type 2 diabetes mellitus with hyperglycemia: Secondary | ICD-10-CM | POA: Diagnosis present

## 2018-02-21 DIAGNOSIS — D649 Anemia, unspecified: Secondary | ICD-10-CM | POA: Diagnosis not present

## 2018-02-21 DIAGNOSIS — R42 Dizziness and giddiness: Secondary | ICD-10-CM | POA: Diagnosis not present

## 2018-02-21 DIAGNOSIS — Z8249 Family history of ischemic heart disease and other diseases of the circulatory system: Secondary | ICD-10-CM

## 2018-02-21 DIAGNOSIS — E119 Type 2 diabetes mellitus without complications: Secondary | ICD-10-CM

## 2018-02-21 DIAGNOSIS — I5022 Chronic systolic (congestive) heart failure: Secondary | ICD-10-CM

## 2018-02-21 DIAGNOSIS — Z79899 Other long term (current) drug therapy: Secondary | ICD-10-CM

## 2018-02-21 DIAGNOSIS — K254 Chronic or unspecified gastric ulcer with hemorrhage: Secondary | ICD-10-CM | POA: Diagnosis present

## 2018-02-21 DIAGNOSIS — Z955 Presence of coronary angioplasty implant and graft: Secondary | ICD-10-CM

## 2018-02-21 LAB — BASIC METABOLIC PANEL
BUN / CREAT RATIO: 27 (ref 12–28)
BUN: 39 mg/dL — AB (ref 8–27)
CALCIUM: 8.9 mg/dL (ref 8.7–10.3)
CO2: 22 mmol/L (ref 20–29)
Chloride: 102 mmol/L (ref 96–106)
Creatinine, Ser: 1.45 mg/dL — ABNORMAL HIGH (ref 0.57–1.00)
GFR, EST AFRICAN AMERICAN: 41 mL/min/{1.73_m2} — AB (ref >59)
GFR, EST NON AFRICAN AMERICAN: 36 mL/min/{1.73_m2} — AB (ref >59)
Glucose: 156 mg/dL — ABNORMAL HIGH (ref 65–99)
Potassium: 4.7 mmol/L (ref 3.5–5.2)
Sodium: 142 mmol/L (ref 134–144)

## 2018-02-21 LAB — COMPREHENSIVE METABOLIC PANEL
ALT: 16 U/L (ref 0–44)
AST: 23 U/L (ref 15–41)
Albumin: 3.3 g/dL — ABNORMAL LOW (ref 3.5–5.0)
Alkaline Phosphatase: 77 U/L (ref 38–126)
Anion gap: 9 (ref 5–15)
BUN: 38 mg/dL — ABNORMAL HIGH (ref 8–23)
CHLORIDE: 105 mmol/L (ref 98–111)
CO2: 24 mmol/L (ref 22–32)
CREATININE: 1.5 mg/dL — AB (ref 0.44–1.00)
Calcium: 8.6 mg/dL — ABNORMAL LOW (ref 8.9–10.3)
GFR calc Af Amer: 38 mL/min — ABNORMAL LOW (ref >60)
GFR, EST NON AFRICAN AMERICAN: 33 mL/min — AB (ref >60)
Glucose, Bld: 187 mg/dL — ABNORMAL HIGH (ref 70–99)
Potassium: 3.8 mmol/L (ref 3.5–5.1)
SODIUM: 138 mmol/L (ref 135–145)
Total Bilirubin: 0.7 mg/dL (ref 0.3–1.2)
Total Protein: 6.3 g/dL — ABNORMAL LOW (ref 6.5–8.1)

## 2018-02-21 LAB — CBC WITH DIFFERENTIAL/PLATELET
ABS IMMATURE GRANULOCYTES: 0.08 10*3/uL — AB (ref 0.00–0.07)
BASOS PCT: 0 %
Basophils Absolute: 0.1 10*3/uL (ref 0.0–0.1)
Eosinophils Absolute: 0.2 10*3/uL (ref 0.0–0.5)
Eosinophils Relative: 2 %
HCT: 22.6 % — ABNORMAL LOW (ref 36.0–46.0)
Hemoglobin: 7.1 g/dL — ABNORMAL LOW (ref 12.0–15.0)
IMMATURE GRANULOCYTES: 1 %
LYMPHS PCT: 21 %
Lymphs Abs: 2.6 10*3/uL (ref 0.7–4.0)
MCH: 29.2 pg (ref 26.0–34.0)
MCHC: 31.4 g/dL (ref 30.0–36.0)
MCV: 93 fL (ref 80.0–100.0)
Monocytes Absolute: 1.1 10*3/uL — ABNORMAL HIGH (ref 0.1–1.0)
Monocytes Relative: 9 %
NEUTROS ABS: 8.3 10*3/uL — AB (ref 1.7–7.7)
NEUTROS PCT: 67 %
PLATELETS: 307 10*3/uL (ref 150–400)
RBC: 2.43 MIL/uL — ABNORMAL LOW (ref 3.87–5.11)
RDW: 16 % — ABNORMAL HIGH (ref 11.5–15.5)
WBC: 12.3 10*3/uL — ABNORMAL HIGH (ref 4.0–10.5)
nRBC: 0 % (ref 0.0–0.2)

## 2018-02-21 LAB — CBC
HEMATOCRIT: 20.7 % — AB (ref 34.0–46.6)
HEMOGLOBIN: 6.9 g/dL — AB (ref 11.1–15.9)
MCH: 29.1 pg (ref 26.6–33.0)
MCHC: 33.3 g/dL (ref 31.5–35.7)
MCV: 87 fL (ref 79–97)
Platelets: 324 10*3/uL (ref 150–450)
RBC: 2.37 x10E6/uL — CL (ref 3.77–5.28)
RDW: 14.9 % (ref 12.3–15.4)
WBC: 12.6 10*3/uL — ABNORMAL HIGH (ref 3.4–10.8)

## 2018-02-21 LAB — POC OCCULT BLOOD, ED: Fecal Occult Bld: POSITIVE — AB

## 2018-02-21 LAB — ABO/RH: ABO/RH(D): O POS

## 2018-02-21 LAB — PREPARE RBC (CROSSMATCH)

## 2018-02-21 MED ORDER — ONDANSETRON HCL 4 MG/2ML IJ SOLN: 4.0000 mg | Freq: Four times a day (QID) | INTRAMUSCULAR | Status: DC | PRN

## 2018-02-21 MED ORDER — ATORVASTATIN CALCIUM 80 MG PO TABS
80.0000 mg | ORAL_TABLET | Freq: Every day | ORAL | Status: DC
  Administered 2018-02-22: 80 mg via ORAL
  Filled 2018-02-21: qty 1

## 2018-02-21 MED ORDER — ACETAMINOPHEN 325 MG PO TABS: 325.0000 mg | ORAL_TABLET | Freq: Four times a day (QID) | ORAL | Status: DC | PRN

## 2018-02-21 MED ORDER — SODIUM CHLORIDE 0.9% IV SOLUTION
Freq: Once | INTRAVENOUS | Status: AC
  Administered 2018-02-21: 21:00:00 via INTRAVENOUS

## 2018-02-21 MED ORDER — NITROGLYCERIN 0.4 MG SL SUBL: 0.4000 mg | SUBLINGUAL_TABLET | SUBLINGUAL | Status: DC | PRN

## 2018-02-21 MED ORDER — PANTOPRAZOLE SODIUM 40 MG IV SOLR
40.0000 mg | Freq: Two times a day (BID) | INTRAVENOUS | Status: DC
  Administered 2018-02-21 – 2018-02-23 (×4): 40 mg via INTRAVENOUS
  Filled 2018-02-21 (×4): qty 40

## 2018-02-21 MED ORDER — FUROSEMIDE 20 MG PO TABS: 20.0000 mg | ORAL_TABLET | ORAL | 0 refills | Status: DC | PRN

## 2018-02-21 MED ORDER — SODIUM CHLORIDE 0.9 % IV SOLN
INTRAVENOUS | Status: DC
  Administered 2018-02-21 – 2018-02-22 (×2): via INTRAVENOUS

## 2018-02-21 MED ORDER — TICAGRELOR 90 MG PO TABS
90.0000 mg | ORAL_TABLET | Freq: Two times a day (BID) | ORAL | Status: DC
  Administered 2018-02-21: 90 mg via ORAL
  Filled 2018-02-21: qty 1

## 2018-02-21 MED ORDER — ONDANSETRON HCL 4 MG PO TABS: 4.0000 mg | ORAL_TABLET | Freq: Four times a day (QID) | ORAL | Status: DC | PRN

## 2018-02-21 MED ORDER — SACUBITRIL-VALSARTAN 49-51 MG PO TABS
1.0000 | ORAL_TABLET | Freq: Two times a day (BID) | ORAL | Status: DC
  Administered 2018-02-21 – 2018-02-23 (×4): 1 via ORAL
  Filled 2018-02-21 (×4): qty 1

## 2018-02-21 MED ORDER — CARVEDILOL 6.25 MG PO TABS
6.2500 mg | ORAL_TABLET | Freq: Two times a day (BID) | ORAL | Status: DC
  Administered 2018-02-21 – 2018-02-23 (×4): 6.25 mg via ORAL
  Filled 2018-02-21 (×4): qty 1

## 2018-02-21 MED ORDER — PANTOPRAZOLE SODIUM 40 MG IV SOLR
40.0000 mg | Freq: Once | INTRAVENOUS | Status: AC
  Administered 2018-02-21: 40 mg via INTRAVENOUS
  Filled 2018-02-21: qty 40

## 2018-02-21 MED ORDER — FUROSEMIDE 20 MG PO TABS: 20.0000 mg | ORAL_TABLET | ORAL | Status: DC | PRN

## 2018-02-21 NOTE — ED Provider Notes (Signed)
[facility] EMERGENCY DEPARTMENT Provider Note   CSN: [id number] Arrival date & time: 02/21/18  1615     History   Chief Complaint Chief Complaint  Patient presents with  . Dizziness  . Abnormal Lab Value    HPI Karen Cooper is a 72 y.o. female.  HPI Planes of generalized weakness for the past 2 days.  She denies pain anywhere.  Denies shortness of breath.  Lightheadedness is worse with standing and improved with lying down.  She informed today that she had hemoglobin of 6.9.  Was told to come to the emergency department after evaluation by physician assistant for her cardiologist.  She denies blood per rectum or black stools.  No other associated symptoms Past Medical History:  Diagnosis Date  . Coronary artery disease   . Hypertension   . Type II diabetes mellitus [facility])     Patient Active Problem List   Diagnosis Date Noted  . Essential hypertension 01/19/2018  . Dyslipidemia 01/19/2018  . Tobacco abuse 01/19/2018  . Ischemic cardiomyopathy 01/19/2018  . CKD (chronic kidney disease) stage 3, GFR 30-59 ml/min ([facility]) 01/19/2018  . Diabetes type 2, controlled ([place]) 01/19/2018  . CAD (coronary artery disease), native coronary artery 01/18/2018  . Abnormal nuclear stress test     Past Surgical History:  Procedure Laterality Date  . BACK SURGERY    . CHOLECYSTECTOMY OPEN  1972  . CORONARY ANGIOPLASTY WITH STENT PLACEMENT  01/18/2018  . CORONARY STENT INTERVENTION N/A 01/18/2018   Procedure: CORONARY STENT INTERVENTION;  Surgeon: Troy Sine, MD;  Location: [facility];  Service: Cardiovascular;  Laterality: N/A;  . INCISION AND DRAINAGE  2004   "infection had settled in my back"  . LEFT HEART CATH AND CORONARY ANGIOGRAPHY N/A 01/18/2018   Procedure: LEFT HEART CATH AND CORONARY ANGIOGRAPHY;  Surgeon: Troy Sine, MD;  Location: [facility];  Service: Cardiovascular;  Laterality: N/A;  . [place]   ruptured disk   . TONSILLECTOMY       OB History   None      Home Medications    Prior to Admission medications   Medication Sig Start Date End Date Taking? Authorizing Provider  aspirin 81 MG chewable tablet Chew 1 tablet (81 mg total) by mouth daily. 01/19/18   Kroeger, Lorelee Cover., PA-C  atorvastatin (LIPITOR) 80 MG tablet Take 1 tablet (80 mg total) by mouth daily at 6 PM. 01/19/18   Kroeger, Lorelee Cover., PA-C  carvedilol (COREG) 6.25 MG tablet Take 1 tablet (6.25 mg total) by mouth 2 (two) times daily. 01/19/18 01/19/19  Kroeger, Lorelee Cover., PA-C  Cholecalciferol (VITAMIN D3) 1000 units CAPS Take 2,000 Units by mouth daily.     [provider]  furosemide (LASIX) 20 MG tablet Take 1 tablet (20 mg total) by mouth as needed for edema. 02/21/18 02/21/19  Almyra Deforest, PA  metFORMIN (GLUCOPHAGE-XR) 750 MG 24 hr tablet Take 1,500 mg by mouth daily with supper. With evening meal 12/05/17   [provider]  nitroGLYCERIN (NITROSTAT) 0.4 MG SL tablet Place 1 tablet (0.4 mg total) under the tongue every 5 (five) minutes as needed for chest pain. 01/19/18 01/19/19  Kroeger, Lorelee Cover., PA-C  sacubitril-valsartan (ENTRESTO) 49-51 MG Take 1 tablet by mouth 2 (two) times daily. 02/02/18   Almyra Deforest, PA  ticagrelor (BRILINTA) 90 MG TABS tablet Take 1 tablet (90 mg total) by mouth 2 (two) times daily. Patient not taking:  Reported on 02/21/2018 01/19/18   Abigail Butts., PA-C    Family History Family History  Problem Relation Age of Onset  . Dementia Mother   . Heart failure Mother   . Heart disease Father   . Heart attack Brother   . Diabetes Brother   . Heart attack Brother   . Cancer Sister     Social History Social History   Tobacco Use  . Smoking status: Former Smoker    Packs/day: 1.00    Years: 40.00    Pack years: 40.00    Types: Cigarettes    Last attempt to quit: 04/03/2017    Years since quitting: 0.8  . Smokeless tobacco: Never Used  Substance Use Topics  . Alcohol use:  Never    Frequency: Never  . Drug use: Never     Allergies   Patient has no known allergies.   Review of Systems Review of Systems  Constitutional: Negative.   HENT: Negative.   Respiratory: Negative.   Cardiovascular: Negative.   Gastrointestinal: Negative.   Musculoskeletal: Negative.   Skin: Negative.   Neurological: Positive for light-headedness.  Psychiatric/Behavioral: Negative.   All other systems reviewed and are negative.    Physical Exam Updated Vital Signs BP (!) 170/63 (BP Location: Right Arm)   Pulse 96   Temp (!) 97.4 F (36.3 C) (Oral)   Ht 5\' 4"  (1.626 m)   Wt 96.8 kg   SpO2 99%   BMI 36.63 kg/m   Physical Exam  Constitutional: She appears well-developed and well-nourished.  HENT:  Head: Normocephalic and atraumatic.  mucus membranes dry, pale  Eyes: Pupils are equal, round, and reactive to light.  Neck: Neck supple. No tracheal deviation present. No thyromegaly present.  Cardiovascular: Normal rate and regular rhythm.  No murmur heard. Pulmonary/Chest: Effort normal and breath sounds normal.  Abdominal: Soft. Bowel sounds are normal. She exhibits no distension. There is no tenderness.  Obese  Genitourinary: Rectal exam shows guaiac positive stool.  Genitourinary Comments: Rectum normal tone maroon, melanotic stool, grossly heme positive  Musculoskeletal: Normal range of motion. She exhibits no edema or tenderness.  Neurological: She is alert. Coordination normal.  Skin: Skin is warm and dry. No rash noted.  Psychiatric: She has a normal mood and affect.  Nursing note and vitals reviewed.    ED Treatments / Results  Labs (all labs ordered are listed, but only abnormal results are displayed) Labs Reviewed  CBC WITH DIFFERENTIAL/PLATELET  COMPREHENSIVE METABOLIC PANEL  POC OCCULT BLOOD, ED  TYPE AND SCREEN    EKG EKG Interpretation  Date/Time:  Tuesday February 21 2018 16:24:22 EST Ventricular Rate:  90 PR Interval:    QRS  Duration: 148 QT Interval:  423 QTC Calculation: 518 R Axis:   77 Text Interpretation:  Sinus rhythm Short PR interval Left bundle branch block No significant change since last tracing Confirmed by Orlie Dakin [phone]) on 02/21/2018 4:40:36 PM   Radiology No results found.  Procedures Procedures (including critical care time)  Medications Ordered in ED Medications - No data to display Results for orders placed or performed during the hospital encounter of 02/21/18  CBC with Differential/Platelet  Result Value Ref Range   WBC 12.3 (H) 4.0 - 10.5 K/uL   RBC 2.43 (L) 3.87 - 5.11 MIL/uL   Hemoglobin 7.1 (L) 12.0 - 15.0 g/dL   HCT 22.6 (L) 36.0 - 46.0 %   MCV 93.0 80.0 - 100.0 fL   MCH 29.2 26.0 - 34.0  pg   MCHC 31.4 30.0 - 36.0 g/dL   RDW 16.0 (H) 11.5 - 15.5 %   Platelets 307 150 - 400 K/uL   nRBC 0.0 0.0 - 0.2 %   Neutrophils Relative % 67 %   Neutro Abs 8.3 (H) 1.7 - 7.7 K/uL   Lymphocytes Relative 21 %   Lymphs Abs 2.6 0.7 - 4.0 K/uL   Monocytes Relative 9 %   Monocytes Absolute 1.1 (H) 0.1 - 1.0 K/uL   Eosinophils Relative 2 %   Eosinophils Absolute 0.2 0.0 - 0.5 K/uL   Basophils Relative 0 %   Basophils Absolute 0.1 0.0 - 0.1 K/uL   Immature Granulocytes 1 %   Abs Immature Granulocytes 0.08 (H) 0.00 - 0.07 K/uL  Comprehensive metabolic panel  Result Value Ref Range   Sodium 138 135 - 145 mmol/L   Potassium 3.8 3.5 - 5.1 mmol/L   Chloride 105 98 - 111 mmol/L   CO2 24 22 - 32 mmol/L   Glucose, Bld 187 (H) 70 - 99 mg/dL   BUN 38 (H) 8 - 23 mg/dL   Creatinine, Ser 1.50 (H) 0.44 - 1.00 mg/dL   Calcium 8.6 (L) 8.9 - 10.3 mg/dL   Total Protein 6.3 (L) 6.5 - 8.1 g/dL   Albumin 3.3 (L) 3.5 - 5.0 g/dL   AST 23 15 - 41 U/L   ALT 16 0 - 44 U/L   Alkaline Phosphatase 77 38 - 126 U/L   Total Bilirubin 0.7 0.3 - 1.2 mg/dL   GFR calc non Af Amer 33 (L) >60 mL/min   GFR calc Af Amer 38 (L) >60 mL/min   Anion gap 9 5 - 15  POC occult blood, ED Provider will collect    Result Value Ref Range   Fecal Occult Bld POSITIVE (A) NEGATIVE  Type and screen  Result Value Ref Range   ABO/RH(D) O POS    Antibody Screen NEG    Sample Expiration      02/24/2018 Performed at [facility], [address], [place], [place] [address]    Unit Number [id number]    Blood Component Type RED CELLS,LR    Unit division 00    Status of Unit ALLOCATED    Transfusion Status OK TO TRANSFUSE    Crossmatch Result Compatible    Unit Number [id number]    Blood Component Type RED CELLS,LR    Unit division 00    Status of Unit ALLOCATED    Transfusion Status OK TO TRANSFUSE    Crossmatch Result Compatible   ABO/Rh  Result Value Ref Range   ABO/RH(D)      O POS Performed at [facility] [address] [place], [place] [address]   Prepare RBC  Result Value Ref Range   Order Confirmation      ORDER PROCESSED BY BLOOD BANK Performed at [facility], [place] [address], [place], [place] [address]   BPAM [facility]  Result Value Ref Range   Blood Product Unit Number [id number]    Unit Type and Rh 5100    Blood Product Expiration Date [id number]    Blood Product Unit Number [id number]    Unit Type and Rh 5100    Blood Product Expiration Date [phone]    No results found.  Initial Impression / Assessment and Plan / ED Course  I have reviewed the triage vital signs and the nursing notes.  Pertinent labs & imaging results that were available during  my care of the patient were reviewed by me and considered in my medical decision making (see chart for details).     6:35 PM patient is resting comfortably and alert and appears in no distress.  I have consulted Dr.Hijazi hospitalist service will arrange for overnight stay I have also consulted Dr.Vrimbhatt from gastroenterology service who suggests Protonix 40 mg IV twice daily, first dose ordered by me.  She will evaluate patient while in the hospital tomorrow  Were consistent with hyperglycemia  renal insufficiency and anemia patient typed and crossmatched and 2 units of packed red blood cells ordered to be transfused immediately when available Final Clinical Impressions(s) / ED Diagnoses  #1 acute GI bleeding #2 symptomatic anemia #3 renal insufficiency #4 hyperglycemia  CRITICAL CARE Performed by: Orlie Dakin Total critical care time: 35 minutes Critical care time was exclusive of separately billable procedures and treating other patients. Critical care was necessary to treat or prevent imminent or life-threatening deterioration. Critical care was time spent personally by me on the following activities: development of treatment plan with patient and/or surrogate as well as nursing, discussions with consultants, evaluation of patient's response to treatment, examination of patient, obtaining history from patient or surrogate, ordering and performing treatments and interventions, ordering and review of laboratory studies, ordering and review of radiographic studies, pulse oximetry and re-evaluation of patient's condition. Final diagnoses:  None    ED Discharge Orders    None       Orlie Dakin, MD 02/21/18 1842

## 2018-02-21 NOTE — Patient Instructions (Signed)
Medication Instructions:  Take your Furosemide (Lasix) ONLY as needed.  If you need a refill on your cardiac medications before your next appointment, please call your pharmacy.   Lab work: Your physician recommends that you return for lab work today: BMET, CBC  If you have labs (blood work) drawn today and your tests are completely normal, you will receive your results only by: Marland Kitchen MyChart Message (if you have MyChart) OR . A paper copy in the mail If you have any lab test that is abnormal or we need to change your treatment, we will call you to review the results.  Testing/Procedures: none  Follow-Up: At [facility], you and your health needs are our priority.  As part of our continuing mission to provide you with exceptional heart care, we have created designated Provider Care Teams.  These Care Teams include your primary Cardiologist (physician) and Advanced Practice Providers (APPs -  Physician Assistants and Nurse Practitioners) who all work together to provide you with the care you need, when you need it. . Please schedule a follow up appointment to see Karen Deforest, PA in 2 weeks.  Any Other Special Instructions Will Be Listed Below (If Applicable). We will call you with the results of your blood work about holding your [place].  Increase your fluid intake to 32-64 oz daily.

## 2018-02-21 NOTE — Progress Notes (Signed)
Cardiology Office Note    Date:  02/21/2018   ID:  TAQUANNA BORRAS, DOB 10-16-1945, MRN [id number]  PCP:  Holland Commons, FNP  Cardiologist:  Dr. Sallyanne Kuster  Chief Complaint  Patient presents with  . Congestive Heart Failure  . Shortness of Breath  . Dizziness    seen for Dr. Sallyanne Kuster    History of Present Illness:  Karen Cooper is a 72 y.o. female with PMH of CKD stage III, HTN and remote history of smoking was recently noted to have new left bundle branch block.  Both her echo and Myoview showed decreased LV systolic function with EF of around 40%.  Myoview also showed a fixed anteroseptal and apical defect consistent with left bundle branch block but no ischemia.  Echo suggested akinesis of the basal mid anteroseptal myocardium.  To further assess, she underwent cardiac catheterization on 01/18/2018 which showed single-vessel CAD with 80% focal mid LAD stenosis treated with 3.0 x 15 mm resolute Onyx DES postdilated to 3.29 mm.  Patient was placed on high-dose statin.  Note, patient has quit smoking since December 2018  Patient presents today for cardiology office visit.  She has been experiencing some dizziness and blurred vision.  Recent lab work obtained during the last office visit showed elevated creatinine.  I instructed the patient to repeat a lab work in 1 week, this was never done.  I am highly concerned that her symptoms today is related to hypovolemia.  She is also orthostatic as well which further support dehydration.  I stopped her Lasix and the change states to only as needed.  I will obtain a repeat CBC and a basic metabolic panel to rule out hypovolemia and anemia.  If she is truly anemic, she will need to hold Entresto for at least 3 days before restarting.  I will bring her back in 2 weeks for reassessment.  Otherwise she has been compliant with aspirin and Brilinta.  Recent lipid panel showed very well-controlled LDL, however HDL is chronically low.  She has not  picked up smoking again, however her husband who live with her continue to smoke.    Past Medical History:  Diagnosis Date  . Coronary artery disease   . Hypertension   . Type II diabetes mellitus ([place])     Past Surgical History:  Procedure Laterality Date  . BACK SURGERY    . CHOLECYSTECTOMY OPEN  1972  . CORONARY ANGIOPLASTY WITH STENT PLACEMENT  01/18/2018  . CORONARY STENT INTERVENTION N/A 01/18/2018   Procedure: CORONARY STENT INTERVENTION;  Surgeon: Troy Sine, MD;  Location: [place] [facility];  Service: Cardiovascular;  Laterality: N/A;  . INCISION AND DRAINAGE  2004   "infection had settled in my back"  . LEFT HEART CATH AND CORONARY ANGIOGRAPHY N/A 01/18/2018   Procedure: LEFT HEART CATH AND CORONARY ANGIOGRAPHY;  Surgeon: Troy Sine, MD;  Location: [place] [facility];  Service: Cardiovascular;  Laterality: N/A;  . [place]   ruptured disk  . TONSILLECTOMY      Current Medications: Outpatient Medications Prior to Visit  Medication Sig Dispense Refill  . aspirin 81 MG chewable tablet Chew 1 tablet (81 mg total) by mouth daily. 90 tablet 3  . atorvastatin (LIPITOR) 80 MG tablet Take 1 tablet (80 mg total) by mouth daily at 6 PM. 90 tablet 3  . carvedilol (COREG) 6.25 MG tablet Take 1 tablet (6.25 mg total) by mouth 2 (two) times daily. [place]  tablet 3  . Cholecalciferol (VITAMIN D3) 1000 units CAPS Take 2,000 Units by mouth daily.     . metFORMIN (GLUCOPHAGE-XR) 750 MG 24 hr tablet Take 1,500 mg by mouth daily with supper. With evening meal  0  . nitroGLYCERIN (NITROSTAT) 0.4 MG SL tablet Place 1 tablet (0.4 mg total) under the tongue every 5 (five) minutes as needed for chest pain. 25 tablet 3  . sacubitril-valsartan (ENTRESTO) 49-51 MG Take 1 tablet by mouth 2 (two) times daily. 60 tablet 2  . furosemide (LASIX) 20 MG tablet Take 1 tablet (20 mg total) by mouth daily. 30 tablet 3  . ticagrelor (BRILINTA) 90 MG TABS tablet Take 1 tablet (90 mg total)  by mouth 2 (two) times daily. (Patient not taking: Reported on 02/21/2018) 60 tablet 0   No facility-administered medications prior to visit.      Allergies:   Patient has no known allergies.   Social History   Socioeconomic History  . Marital status: Married    Spouse name: Not on file  . Number of children: Not on file  . Years of education: Not on file  . Highest education level: Not on file  Occupational History  . Not on file  Social Needs  . Financial resource strain: Not on file  . Food insecurity:    Worry: Not on file    Inability: Not on file  . Transportation needs:    Medical: Not on file    Non-medical: Not on file  Tobacco Use  . Smoking status: Former Smoker    Packs/day: 1.00    Years: 40.00    Pack years: 40.00    Types: Cigarettes    Last attempt to quit: 04/03/2017    Years since quitting: 0.8  . Smokeless tobacco: Never Used  Substance and Sexual Activity  . Alcohol use: Never    Frequency: Never  . Drug use: Never  . Sexual activity: Not Currently  Lifestyle  . Physical activity:    Days per week: Not on file    Minutes per session: Not on file  . Stress: Not on file  Relationships  . Social connections:    Talks on phone: Not on file    Gets together: Not on file    Attends religious service: Not on file    Active member of club or organization: Not on file    Attends meetings of clubs or organizations: Not on file    Relationship status: Not on file  Other Topics Concern  . Not on file  Social History Narrative  . Not on file     Family History:  The patient's family history includes Cancer in her sister; Dementia in her mother; Diabetes in her brother; Heart attack in her brother and brother; Heart disease in her father; Heart failure in her mother.   ROS:   Please see the history of present illness.    ROS All other systems reviewed and are negative.   PHYSICAL EXAM:   VS:  BP (!) 162/82 (BP Location: Left Arm, Cuff Size:  Large)   Pulse 86   Resp 16   Ht 5\' 4"  (1.626 m)   Wt 213 lb 6.4 oz (96.8 kg)   SpO2 97%   BMI 36.63 kg/m    GEN: Well nourished, well developed, in no acute distress  HEENT: normal  Neck: no JVD, carotid bruits, or masses Cardiac: RRR; no murmurs, rubs, or gallops,no edema  Respiratory: Diminished breath sound bilaterally,  no crackles, rhonchi or wheezing GI: soft, nontender, nondistended, + BS MS: no deformity or atrophy  Skin: warm and dry, no rash Neuro:  Alert and Oriented x 3, Strength and sensation are intact Psych: euthymic mood, full affect  Wt Readings from Last 3 Encounters:  02/21/18 213 lb 6.4 oz (96.8 kg)  02/02/18 215 lb (97.5 kg)  01/19/18 222 lb 3.6 oz (100.8 kg)      Studies/Labs Reviewed:   EKG:  EKG is ordered today.  The ekg ordered today demonstrates normal sinus rhythm with left bundle branch block with occasional PACs.  Recent Labs: 01/19/2018: Hemoglobin 9.8; Platelets 273 02/10/2018: ALT 10; BUN 32; Creatinine, Ser 1.54; Potassium 4.7; Sodium 140   Lipid Panel    Component Value Date/Time   CHOL 78 (L) 02/10/2018 0921   TRIG 131 02/10/2018 0921   HDL 34 (L) 02/10/2018 0921   CHOLHDL 2.3 02/10/2018 0921   LDLCALC 18 02/10/2018 0921    Additional studies/ records that were reviewed today include:      ASSESSMENT:    1. Dizziness   2. Chronic systolic heart failure ([place])   3. Coronary artery disease involving native coronary artery of native heart without angina pectoris   4. Essential hypertension   5. AKI (acute kidney injury) ([place])   6. Tobacco abuse, in remission      PLAN:  In order of problems listed above:  1. Dizziness: Orthostatic blood pressure shows lying blood pressure 138/52, heart rate 74, sitting blood pressure 132/50 heart rate 75, standing blood pressure 108/42 heart rate 79.  I think her orthostatic hypotension and recent dizziness is related to hypovolemia.  I have stopped her Lasix and to change this to as  needed.  I will obtain CBC and basic metabolic panel to rule out anemia and worsening renal function.  If renal function is worse, will hold Entresto for at least 3 days.  2. CAD: Underwent DES to LAD, compliant with aspirin and Brilinta  3. Chronic systolic heart failure: EF 40%.  Started on [place] during the last office visit.  She appears to be dry on physical exam  4. Acute kidney injury: She was supposed to repeat a blood work a week ago, this never occurred.  We will repeat a basic metabolic panel today  5. Hypertension: Blood pressure remain elevated, will hold off on uptitrating Entresto until we know her renal function  6. Tobacco abuse: Quit smoking after the recent PCI, has not picked up smoking again.  Unfortunately her husband continue to smoke.  She also likely has undiagnosed COPD.  Recommend she discuss with her primary care provider to consider a pulmonary function study.    Medication Adjustments/Labs and Tests Ordered: Current medicines are reviewed at length with the patient today.  Concerns regarding medicines are outlined above.  Medication changes, Labs and Tests ordered today are listed in the Patient Instructions below. Patient Instructions  Medication Instructions:  Take your Furosemide (Lasix) ONLY as needed.  If you need a refill on your cardiac medications before your next appointment, please call your pharmacy.   Lab work: Your physician recommends that you return for lab work today: BMET, CBC  If you have labs (blood work) drawn today and your tests are completely normal, you will receive your results only by: Marland Kitchen MyChart Message (if you have MyChart) OR . A paper copy in the mail If you have any lab test that is abnormal or we need to change your treatment, we will  call you to review the results.  Testing/Procedures: none  Follow-Up: At [facility], you and your health needs are our priority.  As part of our continuing mission to provide you with  exceptional heart care, we have created designated Provider Care Teams.  These Care Teams include your primary Cardiologist (physician) and Advanced Practice Providers (APPs -  Physician Assistants and Nurse Practitioners) who all work together to provide you with the care you need, when you need it. . Please schedule a follow up appointment to see Almyra Deforest, PA in 2 weeks.  Any Other Special Instructions Will Be Listed Below (If Applicable). We will call you with the results of your blood work about holding your [place].  Increase your fluid intake to 32-64 oz daily.       Karen Cooper, [place]  02/21/2018 10:05 AM    [address], [place], [place]  [address] Phone: [phone]; Fax: [phone]

## 2018-02-21 NOTE — Progress Notes (Signed)
-   Received page for Consult for symptomatic anemia. D/W ER Physician. Patient hemodynamically stable. Hgb stable compared to this morning. Patient denied overt bleeding but was found to have melanotic stool on rectal exam. ( Patient not seen by me personally)   -  Recommend Blood transfusion  - IV BID PPI  - NPO past Midnight  - [facility] will see patient tomorrow. Please call us back if any acute changes.   Otis Brace MD, FACP 02/21/2018, 6:42 PM  Contact #  [phone]

## 2018-02-21 NOTE — Telephone Encounter (Signed)
Received a call from [place] for a critical Hgb 6.9 and RBC 2.37. Almyra Deforest, [place] notified and will address.

## 2018-02-21 NOTE — H&P (Signed)
[facility]                                                                                    Patient Demographics  Karen Cooper, is a 72 y.o. female  CSN: [id number]  MRN: [id number]  DOB - 01-Sep-1945  Admit Date - 02/21/2018  Outpatient Primary MD for the patient is Adria Dill [place] Reader, FNP   With History of -  Past Medical History:  Diagnosis Date  . Coronary artery disease   . Hypertension   . Type II diabetes mellitus ([place])       Past Surgical History:  Procedure Laterality Date  . BACK SURGERY    . CHOLECYSTECTOMY OPEN  1972  . CORONARY ANGIOPLASTY WITH STENT PLACEMENT  01/18/2018  . CORONARY STENT INTERVENTION N/A 01/18/2018   Procedure: CORONARY STENT INTERVENTION;  Surgeon: Troy Sine, MD;  Location: [place] [facility];  Service: Cardiovascular;  Laterality: N/A;  . INCISION AND DRAINAGE  2004   "infection had settled in my back"  . LEFT HEART CATH AND CORONARY ANGIOGRAPHY N/A 01/18/2018   Procedure: LEFT HEART CATH AND CORONARY ANGIOGRAPHY;  Surgeon: Troy Sine, MD;  Location: [place] [facility];  Service: Cardiovascular;  Laterality: N/A;  . [place]   ruptured disk  . TONSILLECTOMY      in for   Chief Complaint  Patient presents with  . Dizziness  . Abnormal Lab Value     HPI  Karen Cooper  is a 72 y.o. female, with past medical history significant for coronary artery disease status post stent placement on Brilinta and aspirin, hypertension and diabetes mellitus type 2 who was sent from her cardiologist's office for evaluation of anemia, hemoglobin was 6.9.  The patient has been feeling weak especially in the last 2 days.  She said that she feels how it is to be drunk since her head feels like swimming.  In the emergency room the patient hemoglobin was noted to be 7.1 and she is in the process of being transfused 2 units of packed RBCs.  Her FOBT was positive.  Gastroenterology Dr. Alessandra Bevels was consulted and  the patient will be admitted for further work-up.  Patient denies any chest pains but complain of episodic shortness of breath.  No abdominal pain, nausea or vomiting    Review of Systems    In addition to the HPI above,  No Fever-chills, No Headache, No changes with Vision or hearing, No problems swallowing food or Liquids, No Chest pain, No Abdominal pain, No Nausea or Vommitting, Bowel movements are regular, No Blood in stool or Urine, No dysuria, No new skin rashes or bruises, No new joints pains-aches,  No new weakness, tingling, numbness in any extremity, No recent weight gain or loss, No polyuria, polydypsia or polyphagia, No significant Mental Stressors.  A full 10 point Review of Systems was done, except as stated above, all other Review of Systems were negative.   Social History Social History   Tobacco Use  . Smoking status: Former Smoker    Packs/day: 1.00    Years: 40.00    Pack years: 40.00  Types: Cigarettes    Last attempt to quit: 04/03/2017    Years since quitting: 0.8  . Smokeless tobacco: Never Used  Substance Use Topics  . Alcohol use: Never    Frequency: Never     Family History Family History  Problem Relation Age of Onset  . Dementia Mother   . Heart failure Mother   . Heart disease Father   . Heart attack Brother   . Diabetes Brother   . Heart attack Brother   . Cancer Sister      Prior to Admission medications   Medication Sig Start Date End Date Taking? Authorizing Provider  acetaminophen (TYLENOL) 325 MG tablet Take 325-650 mg by mouth every 6 (six) hours as needed for mild pain or headache.   Yes [provider]  aspirin 81 MG chewable tablet Chew 1 tablet (81 mg total) by mouth daily. 01/19/18  Yes Kroeger, Daleen Snook M., PA-C  atorvastatin (LIPITOR) 80 MG tablet Take 1 tablet (80 mg total) by mouth daily at 6 PM. 01/19/18  Yes Kroeger, Daleen Snook M., PA-C  carvedilol (COREG) 6.25 MG tablet Take 1 tablet (6.25 mg total) by  mouth 2 (two) times daily. 01/19/18 01/19/19 Yes Kroeger, Lorelee Cover., PA-C  Cholecalciferol (VITAMIN D3) 1000 units CAPS Take 2,000 Units by mouth daily.    Yes [provider]  furosemide (LASIX) 20 MG tablet Take 1 tablet (20 mg total) by mouth as needed for edema. 02/21/18 02/21/19 Yes Almyra Deforest, PA  metFORMIN (GLUCOPHAGE-XR) 750 MG 24 hr tablet Take 1,500 mg by mouth daily with supper.  12/05/17  Yes [provider]  nitroGLYCERIN (NITROSTAT) 0.4 MG SL tablet Place 1 tablet (0.4 mg total) under the tongue every 5 (five) minutes as needed for chest pain. 01/19/18 01/19/19 Yes Kroeger, Daleen Snook M., PA-C  sacubitril-valsartan (ENTRESTO) 49-51 MG Take 1 tablet by mouth 2 (two) times daily. 02/02/18  Yes Almyra Deforest, PA  ticagrelor (BRILINTA) 90 MG TABS tablet Take 1 tablet (90 mg total) by mouth 2 (two) times daily. 01/19/18  Yes Kroeger, Lorelee Cover., PA-C    No Known Allergies  Physical Exam  Vitals  Blood pressure (!) 141/55, pulse 68, temperature (!) 97.3 F (36.3 C), temperature source Oral, resp. rate 18, height 5\' 4"  (1.626 m), weight 96.8 kg, SpO2 98 %.   1. General elderly female, looks tired, no acute distress, very pleasant  2. Normal affect and insight, Not Suicidal or Homicidal, Awake Alert, Oriented X 3.  3. No F.N deficits, ALL C.Nerves Intact, Strength 5/5 all 4 extremities, Sensation intact all 4 extremities, Plantars down going.  4. Ears and Eyes appear Normal, Conjunctivae clear, PERRLA. Moist Oral Mucosa.  5. Supple Neck, No JVD, No cervical lymphadenopathy appriciated, No Carotid Bruits.  6. Symmetrical Chest wall movement, Good air movement bilaterally, CTAB.  7. RRR, No Gallops, Rubs or Murmurs, No Parasternal Heave.  8. Positive Bowel Sounds, Abdomen Soft, Non tender, No organomegaly appriciated,No rebound -guarding or rigidity.  9.  No Cyanosis, Normal Skin Turgor, No Skin Rash or Bruise.  10. Good muscle tone,  joints appear normal , no effusions,  Normal ROM.    Data Review  CBC Recent Labs  Lab 02/21/18 1009 02/21/18 1648  WBC 12.6* 12.3*  HGB 6.9* 7.1*  HCT 20.7* 22.6*  PLT 324 307  MCV 87 93.0  MCH 29.1 29.2  MCHC 33.3 31.4  RDW 14.9 16.0*  LYMPHSABS  --  2.6  MONOABS  --  1.1*  EOSABS  --  0.2  BASOSABS  --  0.1   ------------------------------------------------------------------------------------------------------------------  Chemistries  Recent Labs  Lab 02/21/18 1009 02/21/18 1648  NA 142 138  K 4.7 3.8  CL 102 105  CO2 22 24  GLUCOSE 156* 187*  BUN 39* 38*  CREATININE 1.45* 1.50*  CALCIUM 8.9 8.6*  AST  --  23  ALT  --  16  ALKPHOS  --  77  BILITOT  --  0.7   ------------------------------------------------------------------------------------------------------------------ estimated creatinine clearance is 37.1 mL/min (A) (by C-G formula based on SCr of 1.5 mg/dL (H)). ------------------------------------------------------------------------------------------------------------------ No results for input(s): TSH, T4TOTAL, T3FREE, THYROIDAB in the last 72 hours.  Invalid input(s): FREET3   Coagulation profile No results for input(s): INR, PROTIME in the last 168 hours. ------------------------------------------------------------------------------------------------------------------- No results for input(s): DDIMER in the last 72 hours. -------------------------------------------------------------------------------------------------------------------  Cardiac Enzymes No results for input(s): CKMB, TROPONINI, MYOGLOBIN in the last 168 hours.  Invalid input(s): CK ------------------------------------------------------------------------------------------------------------------ Invalid input(s): POCBNP   ---------------------------------------------------------------------------------------------------------------  Urinalysis No results found for: COLORURINE, APPEARANCEUR, LABSPEC, [place],  GLUCOSEU, HGBUR, BILIRUBINUR, KETONESUR, PROTEINUR, UROBILINOGEN, NITRITE, LEUKOCYTESUR  ----------------------------------------------------------------------------------------------------------------   Imaging results:   No results found.  My personal review of EKG: Normal sinus rhythm 80 bpm with left bundle branch block  Assessment & Plan  GI bleed ; for blood transfusion Hold Brilinta and aspirin Follow hemoglobin/hematocrit IV Protonix Gastroenterology on consult Hemodynamically stable  Coronary artery disease status post stent placement on aspirin and Brilinta continue with Entresto  Hyperlipidemia continue with Lipitor  Hypertension continue with Coreg  Diabetes mellitus type 2 controlled, DC metformin due to renal insufficiency Placed on insulin sliding scale  Renal insufficiency stage III on chronic  DVT SCDs  AM Labs Ordered, also please review Full Orders    Code Status full  Disposition Plan: Home  Time spent in minutes : 38 minutes  Condition GUARDED   @SIGNATURE @

## 2018-02-21 NOTE — ED Triage Notes (Signed)
Pt reports that she has been dizzy for two days. Pt states she went to a Cardiologist appointment today and had lab work drawn. Stated that she received a call stating that her hemoglobin is low and she needed to come to the ED.

## 2018-02-21 NOTE — Telephone Encounter (Signed)
Informed by nurse regarding critical lab. WBC 12.6, RBC 2.37, Hgb 6.9, hematocrit 20.7, BUN 39, Cr 1.45. Lab currently not available in Epic due to delayed upload. During today's visit, I was concerned she was dehydrated, I changed her lasix to as needed instead of daily. Lab also supports this as well. Discussed with patient, sending patient to [facility] from home. Need workup for anemia and may need blood transfusion. She is on ASA and Brilinta given recent drug eluting stent in Oct 2019. Made Dr. Martinique aware. Dr. Martinique is rounding in the hospital this week. Also informed ED charge nurse.

## 2018-02-22 ENCOUNTER — Inpatient Hospital Stay (HOSPITAL_COMMUNITY): Payer: Medicare Other | Admitting: Certified Registered Nurse Anesthetist

## 2018-02-22 ENCOUNTER — Encounter (HOSPITAL_COMMUNITY)
Admission: EM | Disposition: A | Discharge status: Home / Self Care | Payer: Self-pay | Source: Home / Self Care | Attending: Internal Medicine

## 2018-02-22 ENCOUNTER — Encounter (HOSPITAL_COMMUNITY): Payer: Self-pay

## 2018-02-22 DIAGNOSIS — I2510 Atherosclerotic heart disease of native coronary artery without angina pectoris: Secondary | ICD-10-CM

## 2018-02-22 DIAGNOSIS — K922 Gastrointestinal hemorrhage, unspecified: Secondary | ICD-10-CM

## 2018-02-22 DIAGNOSIS — D62 Acute posthemorrhagic anemia: Secondary | ICD-10-CM

## 2018-02-22 HISTORY — PX: ESOPHAGOGASTRODUODENOSCOPY: SHX1529

## 2018-02-22 HISTORY — PX: ESOPHAGOGASTRODUODENOSCOPY (EGD) WITH PROPOFOL: SHX5813

## 2018-02-22 LAB — HEMOGLOBIN: Hemoglobin: 9.5 g/dL — ABNORMAL LOW (ref 12.0–15.0)

## 2018-02-22 LAB — CBC
HCT: 30.5 % — ABNORMAL LOW (ref 36.0–46.0)
Hemoglobin: 9.5 g/dL — ABNORMAL LOW (ref 12.0–15.0)
MCH: 28.5 pg (ref 26.0–34.0)
MCHC: 31.1 g/dL (ref 30.0–36.0)
MCV: 91.6 fL (ref 80.0–100.0)
Platelets: 268 10*3/uL (ref 150–400)
RBC: 3.33 MIL/uL — ABNORMAL LOW (ref 3.87–5.11)
RDW: 14.9 % (ref 11.5–15.5)
WBC: 12.6 10*3/uL — AB (ref 4.0–10.5)
nRBC: 0 % (ref 0.0–0.2)

## 2018-02-22 LAB — GLUCOSE, CAPILLARY
GLUCOSE-CAPILLARY: 127 mg/dL — AB (ref 70–99)
Glucose-Capillary: 185 mg/dL — ABNORMAL HIGH (ref 70–99)

## 2018-02-22 SURGERY — ESOPHAGOGASTRODUODENOSCOPY (EGD) WITH PROPOFOL
Anesthesia: Monitor Anesthesia Care

## 2018-02-22 MED ORDER — PROPOFOL 500 MG/50ML IV EMUL
INTRAVENOUS | Status: DC | PRN
  Administered 2018-02-22: 75 ug/kg/min via INTRAVENOUS

## 2018-02-22 MED ORDER — PROPOFOL 10 MG/ML IV BOLUS
INTRAVENOUS | Status: DC | PRN
  Administered 2018-02-22: 10 mg via INTRAVENOUS
  Administered 2018-02-22 (×2): 20 mg via INTRAVENOUS

## 2018-02-22 MED ORDER — INSULIN ASPART 100 UNIT/ML ~~LOC~~ SOLN: 0.0000 [IU] | Freq: Three times a day (TID) | SUBCUTANEOUS | Status: DC

## 2018-02-22 MED ORDER — SODIUM CHLORIDE 0.9 % IV SOLN
INTRAVENOUS | Status: DC
  Administered 2018-02-22: 13:00:00 via INTRAVENOUS

## 2018-02-22 MED ORDER — CLOPIDOGREL BISULFATE 75 MG PO TABS
75.0000 mg | ORAL_TABLET | Freq: Every day | ORAL | Status: DC
  Administered 2018-02-23: 75 mg via ORAL
  Filled 2018-02-22: qty 1

## 2018-02-22 MED ORDER — INFLUENZA VAC SPLIT HIGH-DOSE 0.5 ML IM SUSY
0.5000 mL | PREFILLED_SYRINGE | INTRAMUSCULAR | Status: AC
  Administered 2018-02-23: 0.5 mL via INTRAMUSCULAR
  Filled 2018-02-22: qty 0.5

## 2018-02-22 MED ORDER — CLOPIDOGREL BISULFATE 75 MG PO TABS
150.0000 mg | ORAL_TABLET | Freq: Once | ORAL | Status: AC
  Administered 2018-02-22: 150 mg via ORAL
  Filled 2018-02-22: qty 2

## 2018-02-22 MED ORDER — LIDOCAINE HCL (CARDIAC) PF 100 MG/5ML IV SOSY
PREFILLED_SYRINGE | INTRAVENOUS | Status: DC | PRN
  Administered 2018-02-22: 40 mg via INTRAVENOUS

## 2018-02-22 MED ORDER — CLOPIDOGREL BISULFATE 75 MG PO TABS: 150.0000 mg | ORAL_TABLET | Freq: Once | ORAL | Status: DC

## 2018-02-22 MED ORDER — FUROSEMIDE 20 MG PO TABS: 20.0000 mg | ORAL_TABLET | Freq: Every day | ORAL | Status: DC | PRN

## 2018-02-22 SURGICAL SUPPLY — 15 items

## 2018-02-22 NOTE — Transfer of Care (Signed)
Immediate Anesthesia Transfer of Care Note  Patient: Karen Cooper  Procedure(s) Performed: ESOPHAGOGASTRODUODENOSCOPY (EGD) WITH PROPOFOL (N/A )  Patient Location: PACU  Anesthesia Type:MAC  Level of Consciousness: awake, alert , oriented and patient cooperative  Airway & Oxygen Therapy: Patient Spontanous Breathing and Patient connected to nasal cannula oxygen  Post-op Assessment: Report given to RN and Post -op Vital signs reviewed and stable  Post vital signs: Reviewed and stable  Last Vitals:  Vitals Value Taken Time  BP    Temp    Pulse 71 02/22/2018  2:13 PM  Resp 20 02/22/2018  2:13 PM  SpO2 100 % 02/22/2018  2:13 PM  Vitals shown include unvalidated device data.  Last Pain:  Vitals:   02/22/18 1253  TempSrc: Oral  PainSc: 0-No pain         Complications: No apparent anesthesia complications

## 2018-02-22 NOTE — Interval H&P Note (Signed)
History and Physical Interval Note:  02/22/2018 1:51 PM  Karen Cooper  has presented today for surgery, with the diagnosis of GI bleed  The various methods of treatment have been discussed with the patient and family. After consideration of risks, benefits and other options for treatment, the patient has consented to  Procedure(s): ESOPHAGOGASTRODUODENOSCOPY (EGD) WITH PROPOFOL (N/A) as a surgical intervention .  The patient's history has been reviewed, patient examined, no change in status, stable for surgery.  I have reviewed the patient's chart and labs.  Questions were answered to the patient's satisfaction.     Lear Ng

## 2018-02-22 NOTE — Consult Note (Signed)
Cardiology Consultation:   Patient ID: Karen Cooper MRN: [id number]; DOB: April 10, 1946  Admit date: 02/21/2018 Date of Consult: 02/22/2018  Primary Care Provider: Holland Commons, FNP Primary Cardiologist: Sanda Klein, MD  Primary Electrophysiologist:  None    Patient Profile:   Karen Cooper is a 72 y.o. female with a hx of CKD stage III, HTN, and CAD s/p recent DES to mLAD who is being seen today for the evaluation of antiplatelet management in the setting of severe GI bleed at the request of Dr. Waldron Labs.  History of Present Illness:   Karen Cooper is a 72 year old female was past medical history of CKD stage III, HTN and CAD status post recent DES to mid LAD.  Patient was recently noted to have new left bundle branch block.  She also have a remote history of smoking as well.  Echocardiogram and Myoview showed decreased LV function with EF around 40.  Mildly also showed a fixed anteroseptal and apical defect consistent with left bundle branch block but no ischemia.  Echocardiogram suggested akinesis of the basal mid anteroseptal myocardium.  She underwent cardiac catheterization on 01/18/2018 which showed single-vessel disease with 80% focal mid LAD stenosis treated with 3.0 x 15 mm resolute Onyx DES postdilated to 3.29 mm.  I recently saw the patient on 02/21/2018 at which time she was orthostatic with dizziness and blurred vision.  I was concerned that she might be dehydrated and changed her to 20 mg daily of Lasix to as needed dose only.  She is also on Entresto for her LV dysfunction as well.  I obtained a CBC and basic metabolic panel to assess for secondary causes for dizziness.  CBC showed hemoglobin down to 6.9.  I instructed the patient to seek medical attention in the emergency room.  Overnight, patient was transfused with 4 units of packed red blood cell.  Hemoglobin has came up nicely to 9.5.  Dizziness has completely resolved by this point.  GI service has been consulted and  the plan to proceed with endoscopy today.  Per patient, she does not look at her stools at home, therefore does not know if she has recent redness in the stool or dark stool.  However in the emergency room, she had positive fecal occult blood.   Past Medical History:  Diagnosis Date  . Coronary artery disease   . Hypertension   . Type II diabetes mellitus ([place])     Past Surgical History:  Procedure Laterality Date  . BACK SURGERY    . CHOLECYSTECTOMY OPEN  1972  . CORONARY ANGIOPLASTY WITH STENT PLACEMENT  01/18/2018  . CORONARY STENT INTERVENTION N/A 01/18/2018   Procedure: CORONARY STENT INTERVENTION;  Surgeon: Troy Sine, MD;  Location: [facility];  Service: Cardiovascular;  Laterality: N/A;  . INCISION AND DRAINAGE  2004   "infection had settled in my back"  . LEFT HEART CATH AND CORONARY ANGIOGRAPHY N/A 01/18/2018   Procedure: LEFT HEART CATH AND CORONARY ANGIOGRAPHY;  Surgeon: Troy Sine, MD;  Location: [facility];  Service: Cardiovascular;  Laterality: N/A;  . [place]   ruptured disk  . TONSILLECTOMY         Inpatient Medications: Scheduled Meds: . atorvastatin  80 mg Oral q1800  . carvedilol  6.25 mg Oral BID  . pantoprazole (PROTONIX) IV  40 mg Intravenous Q12H  . sacubitril-valsartan  1 tablet Oral BID  . ticagrelor  90 mg Oral BID  Continuous Infusions: . sodium chloride 50 mL/hr at 02/21/18 2331   PRN Meds: acetaminophen, furosemide, nitroGLYCERIN, ondansetron **OR** ondansetron (ZOFRAN) IV  Allergies:   No Known Allergies  Social History:   Social History   Socioeconomic History  . Marital status: Married    Spouse name: Not on file  . Number of children: Not on file  . Years of education: Not on file  . Highest education level: Not on file  Occupational History  . Not on file  Social Needs  . Financial resource strain: Not on file  . Food insecurity:    Worry: Not on file    Inability: Not on file  .  Transportation needs:    Medical: Not on file    Non-medical: Not on file  Tobacco Use  . Smoking status: Former Smoker    Packs/day: 1.00    Years: 40.00    Pack years: 40.00    Types: Cigarettes    Last attempt to quit: 04/03/2017    Years since quitting: 0.8  . Smokeless tobacco: Never Used  Substance and Sexual Activity  . Alcohol use: Never    Frequency: Never  . Drug use: Never  . Sexual activity: Not Currently  Lifestyle  . Physical activity:    Days per week: Not on file    Minutes per session: Not on file  . Stress: Not on file  Relationships  . Social connections:    Talks on phone: Not on file    Gets together: Not on file    Attends religious service: Not on file    Active member of club or organization: Not on file    Attends meetings of clubs or organizations: Not on file    Relationship status: Not on file  . Intimate partner violence:    Fear of current or ex partner: Not on file    Emotionally abused: Not on file    Physically abused: Not on file    Forced sexual activity: Not on file  Other Topics Concern  . Not on file  Social History Narrative  . Not on file    Family History:    Family History  Problem Relation Age of Onset  . Dementia Mother   . Heart failure Mother   . Heart disease Father   . Heart attack Brother   . Diabetes Brother   . Heart attack Brother   . Cancer Sister      ROS:  Please see the history of present illness.   All other ROS reviewed and negative.     Physical Exam/Data:   Vitals:   02/22/18 0000 02/22/18 0030 02/22/18 0540 02/22/18 0934  BP: 133/66 (!) 125/48 (!) 176/97 (!) 156/99  Pulse: 64 65 79 67  Resp:   18 18  Temp:   97.6 F (36.4 C) 98 F (36.7 C)  TempSrc:   Oral Oral  SpO2: 98% 97% 97% 99%  Weight:      Height:        Intake/Output Summary (Last 24 hours) at 02/22/2018 1147 Last data filed at 02/21/2018 2232 Gross per 24 hour  Intake 1301.84 ml  Output -  Net 1301.84 ml   Filed  Weights   02/21/18 1623  Weight: 96.8 kg   Body mass index is 36.63 kg/m.  General:  Well nourished, well developed, in no acute distress HEENT: normal Lymph: no adenopathy Neck: no JVD Endocrine:  No thryomegaly Vascular: No carotid bruits; FA pulses 2+ bilaterally  without bruits  Cardiac:  normal S1, S2; RRR; no murmur  Lungs:  clear to auscultation bilaterally, no wheezing, rhonchi or rales  Abd: soft, nontender, no hepatomegaly  Ext: no edema Musculoskeletal:  No deformities, BUE and BLE strength normal and equal Skin: warm and dry  Neuro:  CNs 2-12 intact, no focal abnormalities noted Psych:  Normal affect   EKG:  The EKG was personally reviewed and demonstrates:  NSR with LBBB   Relevant CV Studies:  Cath 01/18/2018  Mid RCA lesion is 30% stenosed.  Post intervention, there is a 0% residual stenosis.  Prox LAD to Mid LAD lesion is 80% stenosed.  A stent was successfully placed.   Predominant single-vessel CAD with 80% focal mid LAD stenosis after the takeoff of the first diagonal and septal perforating artery.  Normal ramus intermediate and left circumflex coronary artery.  Luminal irregularity of the RCA with smooth 30% mid stenosis.  LVEDP 10 mmHg.  Successful PCI to the mid LAD with ultimate insertion of a 3.0 x 15 mm Resolute Onyx DES stent postdilated to 3.29 mm with the 80% stenosis being reduced to 0%.  RECOMMENDATION: Medical therapy for concomitant CAD.  Initiation of high potency statin therapy.  Optimal blood pressure control with target blood pressure less than 130/80.  Recommend uninterrupted dual antiplatelet therapy with Aspirin 81mg  daily and Ticagrelor 90mg  twice daily for a minimum of 6 months (stable ischemic heart disease - Class I recommendation).  Laboratory Data:  Chemistry Recent Labs  Lab 02/21/18 1009 02/21/18 1648  NA 142 138  K 4.7 3.8  CL 102 105  CO2 22 24  GLUCOSE 156* 187*  BUN 39* 38*  CREATININE 1.45* 1.50*    CALCIUM 8.9 8.6*  GFRNONAA 36* 33*  GFRAA 41* 38*  ANIONGAP  --  9    Recent Labs  Lab 02/21/18 1648  PROT 6.3*  ALBUMIN 3.3*  AST 23  ALT 16  ALKPHOS 77  BILITOT 0.7   Hematology Recent Labs  Lab 02/21/18 1009 02/21/18 1648 02/22/18 0306 02/22/18 0834  WBC 12.6* 12.3* 12.6*  --   RBC 2.37* 2.43* 3.33*  --   HGB 6.9* 7.1* 9.5* 9.5*  HCT 20.7* 22.6* 30.5*  --   MCV 87 93.0 91.6  --   MCH 29.1 29.2 28.5  --   MCHC 33.3 31.4 31.1  --   RDW 14.9 16.0* 14.9  --   PLT 324 307 268  --    Cardiac EnzymesNo results for input(s): TROPONINI in the last 168 hours. No results for input(s): TROPIPOC in the last 168 hours.  BNPNo results for input(s): BNP, PROBNP in the last 168 hours.  DDimer No results for input(s): DDIMER in the last 168 hours.  Radiology/Studies:  No results found.  Assessment and Plan:   1. Severe symptomatic anemia 2/2 GI bleed  - ASA on hold at this time, receive a dose of Brilinta last night. Risk of stent thrombosis if off of antiplatelet therapy.   - Will continue to hold aspirin, plan to transition the patient to plavix given its lower bleeding profile. Will load plavix at a much lower dose of 150mg  tonight and switch to 75mg  daily plavix tomorrow. Will decide on further plan pending endoscopy result.   2. CAD s/p DES to mLAD  3. HTN: continue coreg and entresto  4. CKD stage III: recently changed her lasix to PRN only      For questions or updates, please contact [place] Please  consult [email or web address] for contact info under     Signed, Almyra Deforest, [place]  02/22/2018 11:47 AM

## 2018-02-22 NOTE — Op Note (Signed)
[facility] Patient Name: Karen Cooper Procedure Date : 02/22/2018 MRN: [id number] Attending MD: Lear Ng , MD Date of Birth: 02-Apr-1946 CSN: [id number] Age: 72 Admit Type: Inpatient Procedure:                Upper GI endoscopy Indications:              Suspected upper gastrointestinal bleeding, Acute                            post hemorrhagic anemia, Melena Providers:                Lear Ng, MD, Baird Cancer, RN, Elspeth Cho Tech., Technician, Hedy Camara, CRNA Referring MD:             hospital team Medicines:                Propofol per Anesthesia, Monitored Anesthesia Care Complications:            No immediate complications. Estimated Blood Loss:     Estimated blood loss: none. Procedure:                Pre-Anesthesia Assessment:                           - Prior to the procedure, a History and Physical                            was performed, and patient medications and                            allergies were reviewed. The patient's tolerance of                            previous anesthesia was also reviewed. The risks                            and benefits of the procedure and the sedation                            options and risks were discussed with the patient.                            All questions were answered, and informed consent                            was obtained. Prior Anticoagulants: The patient has                            taken no previous anticoagulant or antiplatelet                            agents. ASA Grade Assessment: III - A patient with  severe systemic disease. After reviewing the risks                            and benefits, the patient was deemed in                            satisfactory condition to undergo the procedure.                           After obtaining informed consent, the endoscope was                            passed under  direct vision. Throughout the                            procedure, the patient's blood pressure, pulse, and                            oxygen saturations were monitored continuously. The                            GIF-H190 ([id number]) Olympus Adult EGD was introduced                            through the mouth, and advanced to the second part                            of duodenum. The upper GI endoscopy was                            accomplished without difficulty. The patient                            tolerated the procedure well. Scope In: Scope Out: Findings:      The examined esophagus was normal.      The Z-line was regular and was found 36 cm from the incisors.      One non-bleeding superficial gastric ulcer with no stigmata of bleeding       was found in the gastric antrum. The lesion was 1 mm in largest       dimension.      Segmental mild inflammation characterized by congestion (edema),       erythema and linear erosions was found in the gastric antrum and in the       prepyloric region of the stomach.      The cardia and gastric fundus were normal on retroflexion.      The exam of the stomach was otherwise normal.      Patchy mild mucosal changes characterized by congestion and erythema       were found in the duodenal bulb.      The exam of the duodenum was otherwise normal. Impression:               - Normal esophagus.                           -  Z-line regular, 36 cm from the incisors.                           - Non-bleeding gastric ulcer with no stigmata of                            bleeding.                           - Acute gastritis.                           - Mucosal changes in the duodenum.                           - No specimens collected. Recommendation:           - Patient has a contact number available for                            emergencies. The signs and symptoms of potential                            delayed complications were discussed with the                             patient. Return to normal activities tomorrow.                            Written discharge instructions were provided to the                            patient.                           - Advance diet as tolerated and full liquid diet.                           - Observe patient's clinical course. Procedure Code(s):        --- Professional ---                           [phone], Esophagogastroduodenoscopy, flexible,                            transoral; diagnostic, including collection of                            specimen(s) by brushing or washing, when performed                            (separate procedure) Diagnosis Code(s):        --- Professional ---                           K92.1, Melena (includes Hematochezia)  D62, Acute posthemorrhagic anemia                           K25.9, Gastric ulcer, unspecified as acute or                            chronic, without hemorrhage or perforation                           K29.00, Acute gastritis without bleeding                           K31.89, Other diseases of stomach and duodenum CPT copyright 2018 American Medical Association. All rights reserved. The codes documented in this report are preliminary and upon coder review may  be revised to meet current compliance requirements. Lear Ng, MD 02/22/2018 2:11:49 PM This report has been signed electronically. Number of Addenda: 0

## 2018-02-22 NOTE — H&P (View-Only) (Signed)
Referring Provider: Dr. Waldron Labs Primary Care Physician:  Holland Commons, FNP Primary Gastroenterologist:  Althia Forts  Reason for Consultation:  GI bleed; Anemia  HPI: Karen Cooper is a 71 y.o. female with CAD, DM, and HTN with coronary stent last month on Brilinta admitted for weakness and found to have a Hgb 6.9 (9.8 on 01/19/18). States that she never looks at her stool so does not know about any rectal bleeding. Denies N/V/heartburn. Denies abdominal pain. Melenic stool on rectal exam by ER. Denies having a colonoscopy/EGD. FOBT positive. S/P 2 U PRBCs and now Hgb 9.5.   Past Medical History:  Diagnosis Date  . Coronary artery disease   . Hypertension   . Type II diabetes mellitus ([place])     Past Surgical History:  Procedure Laterality Date  . BACK SURGERY    . CHOLECYSTECTOMY OPEN  1972  . CORONARY ANGIOPLASTY WITH STENT PLACEMENT  01/18/2018  . CORONARY STENT INTERVENTION N/A 01/18/2018   Procedure: CORONARY STENT INTERVENTION;  Surgeon: Troy Sine, MD;  Location: [facility];  Service: Cardiovascular;  Laterality: N/A;  . INCISION AND DRAINAGE  2004   "infection had settled in my back"  . LEFT HEART CATH AND CORONARY ANGIOGRAPHY N/A 01/18/2018   Procedure: LEFT HEART CATH AND CORONARY ANGIOGRAPHY;  Surgeon: Troy Sine, MD;  Location: [facility];  Service: Cardiovascular;  Laterality: N/A;  . [place]   ruptured disk  . TONSILLECTOMY      Prior to Admission medications   Medication Sig Start Date End Date Taking? Authorizing Provider  acetaminophen (TYLENOL) 325 MG tablet Take 325-650 mg by mouth every 6 (six) hours as needed for mild pain or headache.   Yes [provider]  aspirin 81 MG chewable tablet Chew 1 tablet (81 mg total) by mouth daily. 01/19/18  Yes Kroeger, Daleen Snook M., PA-C  atorvastatin (LIPITOR) 80 MG tablet Take 1 tablet (80 mg total) by mouth daily at 6 PM. 01/19/18  Yes Kroeger, Daleen Snook M., PA-C   carvedilol (COREG) 6.25 MG tablet Take 1 tablet (6.25 mg total) by mouth 2 (two) times daily. 01/19/18 01/19/19 Yes Kroeger, Lorelee Cover., PA-C  Cholecalciferol (VITAMIN D3) 1000 units CAPS Take 2,000 Units by mouth daily.    Yes [provider]  furosemide (LASIX) 20 MG tablet Take 1 tablet (20 mg total) by mouth as needed for edema. 02/21/18 02/21/19 Yes Almyra Deforest, PA  metFORMIN (GLUCOPHAGE-XR) 750 MG 24 hr tablet Take 1,500 mg by mouth daily with supper.  12/05/17  Yes [provider]  nitroGLYCERIN (NITROSTAT) 0.4 MG SL tablet Place 1 tablet (0.4 mg total) under the tongue every 5 (five) minutes as needed for chest pain. 01/19/18 01/19/19 Yes Kroeger, Daleen Snook M., PA-C  sacubitril-valsartan (ENTRESTO) 49-51 MG Take 1 tablet by mouth 2 (two) times daily. 02/02/18  Yes Almyra Deforest, PA  ticagrelor (BRILINTA) 90 MG TABS tablet Take 1 tablet (90 mg total) by mouth 2 (two) times daily. 01/19/18  Yes Kroeger, Lorelee Cover., PA-C    Scheduled Meds: . atorvastatin  80 mg Oral q1800  . carvedilol  6.25 mg Oral BID  . pantoprazole (PROTONIX) IV  40 mg Intravenous Q12H  . sacubitril-valsartan  1 tablet Oral BID  . ticagrelor  90 mg Oral BID   Continuous Infusions: . sodium chloride 50 mL/hr at 02/21/18 2331   PRN Meds:.acetaminophen, furosemide, nitroGLYCERIN, ondansetron **OR** ondansetron (ZOFRAN) IV  Allergies as of 02/21/2018  . (No Known Allergies)  Family History  Problem Relation Age of Onset  . Dementia Mother   . Heart failure Mother   . Heart disease Father   . Heart attack Brother   . Diabetes Brother   . Heart attack Brother   . Cancer Sister     Social History   Socioeconomic History  . Marital status: Married    Spouse name: Not on file  . Number of children: Not on file  . Years of education: Not on file  . Highest education level: Not on file  Occupational History  . Not on file  Social Needs  . Financial resource strain: Not on file  . Food insecurity:     Worry: Not on file    Inability: Not on file  . Transportation needs:    Medical: Not on file    Non-medical: Not on file  Tobacco Use  . Smoking status: Former Smoker    Packs/day: 1.00    Years: 40.00    Pack years: 40.00    Types: Cigarettes    Last attempt to quit: 04/03/2017    Years since quitting: 0.8  . Smokeless tobacco: Never Used  Substance and Sexual Activity  . Alcohol use: Never    Frequency: Never  . Drug use: Never  . Sexual activity: Not Currently  Lifestyle  . Physical activity:    Days per week: Not on file    Minutes per session: Not on file  . Stress: Not on file  Relationships  . Social connections:    Talks on phone: Not on file    Gets together: Not on file    Attends religious service: Not on file    Active member of club or organization: Not on file    Attends meetings of clubs or organizations: Not on file    Relationship status: Not on file  . Intimate partner violence:    Fear of current or ex partner: Not on file    Emotionally abused: Not on file    Physically abused: Not on file    Forced sexual activity: Not on file  Other Topics Concern  . Not on file  Social History Narrative  . Not on file    Review of Systems: All negative except as stated above in HPI.  Physical Exam: Vital signs: Vitals:   02/22/18 0540 02/22/18 0934  BP: (!) 176/97 (!) 156/99  Pulse: 79 67  Resp: 18 18  Temp: 97.6 F (36.4 C) 98 F (36.7 C)  SpO2: 97% 99%     General:   Alert,  Morbidly obese, pleasant and cooperative in NAD Head: normocephalic, atraumatic Eyes: anicteric sclera ENT: oropharynx clear Neck: supple, nontender Lungs:  Clear throughout to auscultation.   No wheezes, crackles, or rhonchi. No acute distress. Heart:  Regular rate and rhythm; no murmurs, clicks, rubs,  or gallops. Abdomen: soft, nontender, nondistended, +BS  Rectal:  Deferred Ext: no edema  GI:  Lab Results: Recent Labs    02/21/18 1009 02/21/18 1648  02/22/18 0306 02/22/18 0834  WBC 12.6* 12.3* 12.6*  --   HGB 6.9* 7.1* 9.5* 9.5*  HCT 20.7* 22.6* 30.5*  --   PLT 324 307 268  --    BMET Recent Labs    02/21/18 1009 02/21/18 1648  NA 142 138  K 4.7 3.8  CL 102 105  CO2 22 24  GLUCOSE 156* 187*  BUN 39* 38*  CREATININE 1.45* 1.50*  CALCIUM 8.9 8.6*   LFT Recent  Labs    02/21/18 1648  PROT 6.3*  ALBUMIN 3.3*  AST 23  ALT 16  ALKPHOS 77  BILITOT 0.7   PT/INR No results for input(s): LABPROT, INR in the last 72 hours.   Studies/Results: No results found.  Impression/Plan: 72 yo with symptomatic anemia and melena on rectal exam by ER on Brilinta for a recent stent. EGD today to evaluate for peptic ulcer disease. NPO. Continue Protonix IV. Supportive care.    LOS: 1 day   Lear Ng  02/22/2018, 9:57 AM  Questions please call [phone]

## 2018-02-22 NOTE — ED Notes (Signed)
Pt placed on hospital bed for comfort.

## 2018-02-22 NOTE — Progress Notes (Signed)
Spoke with nurse from ED and let her know that the patient, while in endoscopy, she got a bed on 5W24. ED nurse informed me that she would send up the patient's belongings to 5W24. Patient informed as well.

## 2018-02-22 NOTE — Consult Note (Signed)
Referring Provider: Dr. Waldron Labs Primary Care Physician:  Holland Commons, FNP Primary Gastroenterologist:  Althia Forts  Reason for Consultation:  GI bleed; Anemia  HPI: Karen Cooper is a 72 y.o. female with CAD, DM, and HTN with coronary stent last month on Brilinta admitted for weakness and found to have a Hgb 6.9 (9.8 on 01/19/18). States that she never looks at her stool so does not know about any rectal bleeding. Denies N/V/heartburn. Denies abdominal pain. Melenic stool on rectal exam by ER. Denies having a colonoscopy/EGD. FOBT positive. S/P 2 U PRBCs and now Hgb 9.5.   Past Medical History:  Diagnosis Date  . Coronary artery disease   . Hypertension   . Type II diabetes mellitus ([place])     Past Surgical History:  Procedure Laterality Date  . BACK SURGERY    . CHOLECYSTECTOMY OPEN  1972  . CORONARY ANGIOPLASTY WITH STENT PLACEMENT  01/18/2018  . CORONARY STENT INTERVENTION N/A 01/18/2018   Procedure: CORONARY STENT INTERVENTION;  Surgeon: Troy Sine, MD;  Location: [facility];  Service: Cardiovascular;  Laterality: N/A;  . INCISION AND DRAINAGE  2004   "infection had settled in my back"  . LEFT HEART CATH AND CORONARY ANGIOGRAPHY N/A 01/18/2018   Procedure: LEFT HEART CATH AND CORONARY ANGIOGRAPHY;  Surgeon: Troy Sine, MD;  Location: [facility];  Service: Cardiovascular;  Laterality: N/A;  . [place]   ruptured disk  . TONSILLECTOMY      Prior to Admission medications   Medication Sig Start Date End Date Taking? Authorizing Provider  acetaminophen (TYLENOL) 325 MG tablet Take 325-650 mg by mouth every 6 (six) hours as needed for mild pain or headache.   Yes [provider]  aspirin 81 MG chewable tablet Chew 1 tablet (81 mg total) by mouth daily. 01/19/18  Yes Kroeger, Daleen Snook M., PA-C  atorvastatin (LIPITOR) 80 MG tablet Take 1 tablet (80 mg total) by mouth daily at 6 PM. 01/19/18  Yes Kroeger, Daleen Snook M., PA-C   carvedilol (COREG) 6.25 MG tablet Take 1 tablet (6.25 mg total) by mouth 2 (two) times daily. 01/19/18 01/19/19 Yes Kroeger, Lorelee Cover., PA-C  Cholecalciferol (VITAMIN D3) 1000 units CAPS Take 2,000 Units by mouth daily.    Yes [provider]  furosemide (LASIX) 20 MG tablet Take 1 tablet (20 mg total) by mouth as needed for edema. 02/21/18 02/21/19 Yes Almyra Deforest, PA  metFORMIN (GLUCOPHAGE-XR) 750 MG 24 hr tablet Take 1,500 mg by mouth daily with supper.  12/05/17  Yes [provider]  nitroGLYCERIN (NITROSTAT) 0.4 MG SL tablet Place 1 tablet (0.4 mg total) under the tongue every 5 (five) minutes as needed for chest pain. 01/19/18 01/19/19 Yes Kroeger, Daleen Snook M., PA-C  sacubitril-valsartan (ENTRESTO) 49-51 MG Take 1 tablet by mouth 2 (two) times daily. 02/02/18  Yes Almyra Deforest, PA  ticagrelor (BRILINTA) 90 MG TABS tablet Take 1 tablet (90 mg total) by mouth 2 (two) times daily. 01/19/18  Yes Kroeger, Lorelee Cover., PA-C    Scheduled Meds: . atorvastatin  80 mg Oral q1800  . carvedilol  6.25 mg Oral BID  . pantoprazole (PROTONIX) IV  40 mg Intravenous Q12H  . sacubitril-valsartan  1 tablet Oral BID  . ticagrelor  90 mg Oral BID   Continuous Infusions: . sodium chloride 50 mL/hr at 02/21/18 2331   PRN Meds:.acetaminophen, furosemide, nitroGLYCERIN, ondansetron **OR** ondansetron (ZOFRAN) IV  Allergies as of 02/21/2018  . (No Known Allergies)  Family History  Problem Relation Age of Onset  . Dementia Mother   . Heart failure Mother   . Heart disease Father   . Heart attack Brother   . Diabetes Brother   . Heart attack Brother   . Cancer Sister     Social History   Socioeconomic History  . Marital status: Married    Spouse name: Not on file  . Number of children: Not on file  . Years of education: Not on file  . Highest education level: Not on file  Occupational History  . Not on file  Social Needs  . Financial resource strain: Not on file  . Food insecurity:     Worry: Not on file    Inability: Not on file  . Transportation needs:    Medical: Not on file    Non-medical: Not on file  Tobacco Use  . Smoking status: Former Smoker    Packs/day: 1.00    Years: 40.00    Pack years: 40.00    Types: Cigarettes    Last attempt to quit: 04/03/2017    Years since quitting: 0.8  . Smokeless tobacco: Never Used  Substance and Sexual Activity  . Alcohol use: Never    Frequency: Never  . Drug use: Never  . Sexual activity: Not Currently  Lifestyle  . Physical activity:    Days per week: Not on file    Minutes per session: Not on file  . Stress: Not on file  Relationships  . Social connections:    Talks on phone: Not on file    Gets together: Not on file    Attends religious service: Not on file    Active member of club or organization: Not on file    Attends meetings of clubs or organizations: Not on file    Relationship status: Not on file  . Intimate partner violence:    Fear of current or ex partner: Not on file    Emotionally abused: Not on file    Physically abused: Not on file    Forced sexual activity: Not on file  Other Topics Concern  . Not on file  Social History Narrative  . Not on file    Review of Systems: All negative except as stated above in HPI.  Physical Exam: Vital signs: Vitals:   02/22/18 0540 02/22/18 0934  BP: (!) 176/97 (!) 156/99  Pulse: 79 67  Resp: 18 18  Temp: 97.6 F (36.4 C) 98 F (36.7 C)  SpO2: 97% 99%     General:   Alert,  Morbidly obese, pleasant and cooperative in NAD Head: normocephalic, atraumatic Eyes: anicteric sclera ENT: oropharynx clear Neck: supple, nontender Lungs:  Clear throughout to auscultation.   No wheezes, crackles, or rhonchi. No acute distress. Heart:  Regular rate and rhythm; no murmurs, clicks, rubs,  or gallops. Abdomen: soft, nontender, nondistended, +BS  Rectal:  Deferred Ext: no edema  GI:  Lab Results: Recent Labs    02/21/18 1009 02/21/18 1648  02/22/18 0306 02/22/18 0834  WBC 12.6* 12.3* 12.6*  --   HGB 6.9* 7.1* 9.5* 9.5*  HCT 20.7* 22.6* 30.5*  --   PLT 324 307 268  --    BMET Recent Labs    02/21/18 1009 02/21/18 1648  NA 142 138  K 4.7 3.8  CL 102 105  CO2 22 24  GLUCOSE 156* 187*  BUN 39* 38*  CREATININE 1.45* 1.50*  CALCIUM 8.9 8.6*   LFT Recent  Labs    02/21/18 1648  PROT 6.3*  ALBUMIN 3.3*  AST 23  ALT 16  ALKPHOS 77  BILITOT 0.7   PT/INR No results for input(s): LABPROT, INR in the last 72 hours.   Studies/Results: No results found.  Impression/Plan: 72 yo with symptomatic anemia and melena on rectal exam by ER on Brilinta for a recent stent. EGD today to evaluate for peptic ulcer disease. NPO. Continue Protonix IV. Supportive care.    LOS: 1 day   Lear Ng  02/22/2018, 9:57 AM  Questions please call [phone]

## 2018-02-22 NOTE — Brief Op Note (Addendum)
Tiny gastric ulcer and mild gastritis. Suspect anemia due to blood thinners and possibly small bowel AVMs. Outpt colonoscopy and if anemia persists, then may need an outpt capsule endoscopy as well. Advance diet. Will follow. Ok to go home tomorrow morning if Hgb stable. F/U with me in office in 3-4 weeks.

## 2018-02-22 NOTE — ED Notes (Signed)
Informed consent signed and Pt transported to EGD procedure at this time

## 2018-02-22 NOTE — Anesthesia Preprocedure Evaluation (Signed)
Anesthesia Evaluation  Patient identified by MRN, date of birth, ID band Patient awake    Reviewed: Allergy & Precautions, NPO status , Patient's Chart, lab work & pertinent test results  Airway Mallampati: II  TM Distance: >3 FB Neck ROM: Full    Dental no notable dental hx.    Pulmonary neg pulmonary ROS, former smoker,    Pulmonary exam normal breath sounds clear to auscultation       Cardiovascular hypertension, Pt. on medications + CAD  Normal cardiovascular exam Rhythm:Regular Rate:Normal     Neuro/Psych negative neurological ROS  negative psych ROS   GI/Hepatic negative GI ROS, Neg liver ROS,   Endo/Other  negative endocrine ROSdiabetes  Renal/GU CRFRenal disease  negative genitourinary   Musculoskeletal negative musculoskeletal ROS (+)   Abdominal   Peds negative pediatric ROS (+)  Hematology negative hematology ROS (+)   Anesthesia Other Findings   Reproductive/Obstetrics negative OB ROS                             Anesthesia Physical Anesthesia Plan  ASA: III  Anesthesia Plan: MAC   Post-op Pain Management:    Induction: Intravenous  PONV Risk Score and Plan: 2 and Ondansetron, Midazolam and Treatment may vary due to age or medical condition  Airway Management Planned: Nasal Cannula  Additional Equipment:   Intra-op Plan:   Post-operative Plan:   Informed Consent: I have reviewed the patients History and Physical, chart, labs and discussed the procedure including the risks, benefits and alternatives for the proposed anesthesia with the patient or authorized representative who has indicated his/her understanding and acceptance.   Dental advisory given  Plan Discussed with: CRNA  Anesthesia Plan Comments:         Anesthesia Quick Evaluation

## 2018-02-22 NOTE — Progress Notes (Signed)
PROGRESS NOTE                                                                                                                                                                                                             Patient Demographics:    Karen Cooper, is a 72 y.o. female, DOB - 02/06/1946, MOQ:[id number]  Admit date - 02/21/2018   Admitting Physician No admitting provider for patient encounter.  Outpatient Primary MD for the patient is Adria Dill [place] Reader, [place]  LOS - 1   Chief Complaint  Patient presents with  . Dizziness  . Abnormal Lab Value       Brief Narrative    72 y.o. female, with past medical history significant for coronary artery disease status post stent placement in the LAD October 2019, on Brilinta and aspirin, hypertension and diabetes mellitus type 2 who was sent from her cardiologist's office for evaluation of anemia, hemoglobin was 6.9.  Occult positive stool, she was transfused 2 units PRBC, admitted for further work-up.   Subjective:    Karen Cooper today has, No headache, No chest pain, No abdominal pain - No Nausea, No new weakness tingling or numbness, No Cough - SOB.    Assessment  & Plan :    Active Problems:   CAD (coronary artery disease), native coronary artery   Essential hypertension   Dyslipidemia   CKD (chronic kidney disease) stage 3, GFR 30-59 ml/min (HCC)   Diabetes type 2, controlled (HCC)   GI bleed  GI bleed -Presents with hemoglobin of 6.9, recently started on Brilinta and aspirin, denies any other NSAIDs use. -Never had endoscopy/colonoscopy in the past, -New with PPI -Plan for endoscopy today, GI input greatly appreciated -Currently aspirin and Brilinta on hold, she had recent stent placement with risk for restenosis, see discussion below about resumption of antiplatelet therapy.  Acute blood loss anemia -GI bleed, transfused 2 units PRBC, hemoglobin stable at 9.5, and for hemoglobin  more than 8 given her cardiac history -We will need iron supplements on discharge  CAD -With known stent placement to LAD last month, currently Brilinta and aspirin on hold, she received her planning for tomorrow, cardiology input greatly appreciated, likely will be transitioned to Plavix as lower risk to bleed -Continue Coreg, Lipitor, Entresto   Hyperlipidemia continue with Lipitor  Hypertension continue with Coreg  Diabetes mellitus type 2 controlled, DC metformin due to renal insufficiency Placed on insulin sliding scale  Renal insufficiency stage III  chronic     Code Status : full  Family Communication  : none at bedside  Disposition Plan  : home when stable  Consults  :  GI, cardiology  Procedures  : none  DVT Prophylaxis  :  SCD  Lab Results  Component Value Date   PLT 268 02/22/2018    Antibiotics  :    Anti-infectives (From admission, onward)   None        Objective:   Vitals:   02/22/18 0030 02/22/18 0540 02/22/18 0934 02/22/18 1253  BP: (!) 125/48 (!) 176/97 (!) 156/99 (!) 179/71  Pulse: 65 79 67 74  Resp:  18 18 (!) 21  Temp:  97.6 F (36.4 C) 98 F (36.7 C) 98.3 F (36.8 C)  TempSrc:  Oral Oral Oral  SpO2: 97% 97% 99% 99%  Weight:      Height:        Wt Readings from Last 3 Encounters:  02/21/18 96.8 kg  02/21/18 96.8 kg  02/02/18 97.5 kg     Intake/Output Summary (Last 24 hours) at 02/22/2018 1342 Last data filed at 02/21/2018 2232 Gross per 24 hour  Intake 1301.84 ml  Output -  Net 1301.84 ml     Physical Exam  Awake Alert, Oriented X 3, No new F.N deficits, Normal affect Symmetrical Chest wall movement, Good air movement bilaterally, CTAB RRR,No Gallops,Rubs or new Murmurs, No Parasternal Heave +ve B.Sounds, Abd Soft, No tenderness, No organomegaly appriciated, No rebound - guarding or rigidity. No Cyanosis, Clubbing or edema, No new Rash or bruise      Data Review:    CBC Recent Labs  Lab 02/21/18 1009  02/21/18 1648 02/22/18 0306 02/22/18 0834  WBC 12.6* 12.3* 12.6*  --   HGB 6.9* 7.1* 9.5* 9.5*  HCT 20.7* 22.6* 30.5*  --   PLT 324 307 268  --   MCV 87 93.0 91.6  --   MCH 29.1 29.2 28.5  --   MCHC 33.3 31.4 31.1  --   RDW 14.9 16.0* 14.9  --   LYMPHSABS  --  2.6  --   --   MONOABS  --  1.1*  --   --   EOSABS  --  0.2  --   --   BASOSABS  --  0.1  --   --     Chemistries  Recent Labs  Lab 02/21/18 1009 02/21/18 1648  NA 142 138  K 4.7 3.8  CL 102 105  CO2 22 24  GLUCOSE 156* 187*  BUN 39* 38*  CREATININE 1.45* 1.50*  CALCIUM 8.9 8.6*  AST  --  23  ALT  --  16  ALKPHOS  --  77  BILITOT  --  0.7   ------------------------------------------------------------------------------------------------------------------ No results for input(s): CHOL, HDL, LDLCALC, TRIG, CHOLHDL, LDLDIRECT in the last 72 hours.  No results found for: HGBA1C ------------------------------------------------------------------------------------------------------------------ No results for input(s): TSH, T4TOTAL, T3FREE, THYROIDAB in the last 72 hours.  Invalid input(s): FREET3 ------------------------------------------------------------------------------------------------------------------ No results for input(s): VITAMINB12, FOLATE, FERRITIN, TIBC, IRON, RETICCTPCT in the last 72 hours.  Coagulation profile No results for input(s): INR, PROTIME in the last 168 hours.  No results for input(s): DDIMER in the last 72 hours.  Cardiac Enzymes No results for input(s): CKMB, TROPONINI, MYOGLOBIN in the last 168 hours.  Invalid input(s): CK ------------------------------------------------------------------------------------------------------------------ No results found  for: BNP  Inpatient Medications  Scheduled Meds: . [MAR Hold] atorvastatin  80 mg Oral q1800  . [MAR Hold] carvedilol  6.25 mg Oral BID  . [MAR Hold] clopidogrel  150 mg Oral Once  . [MAR Hold] clopidogrel  75 mg Oral Daily    . [MAR Hold] pantoprazole (PROTONIX) IV  40 mg Intravenous Q12H  . [MAR Hold] sacubitril-valsartan  1 tablet Oral BID   Continuous Infusions: . sodium chloride 50 mL/hr at 02/21/18 2331  . sodium chloride 20 mL/hr at 02/22/18 1255   PRN Meds:.[MAR Hold] acetaminophen, [MAR Hold] furosemide, [MAR Hold] nitroGLYCERIN, [MAR Hold] ondansetron **OR** [MAR Hold] ondansetron (ZOFRAN) IV  Micro Results No results found for this or any previous visit (from the past 240 hour(s)).  Radiology Reports No results found.   Phillips Climes M.D on 02/22/2018 at 1:42 PM  Between 7am to 7pm - Pager - [phone]  After 7pm go to [email or web address] - password [facility]  [facility] -  Office  [phone]

## 2018-02-22 NOTE — Anesthesia Postprocedure Evaluation (Signed)
Anesthesia Post Note  Patient: Karen Cooper  Procedure(s) Performed: ESOPHAGOGASTRODUODENOSCOPY (EGD) WITH PROPOFOL (N/A )     Patient location during evaluation: PACU Anesthesia Type: MAC Level of consciousness: awake and alert Pain management: pain level controlled Vital Signs Assessment: post-procedure vital signs reviewed and stable Respiratory status: spontaneous breathing, nonlabored ventilation and respiratory function stable Cardiovascular status: stable and blood pressure returned to baseline Postop Assessment: no apparent nausea or vomiting Anesthetic complications: no    Last Vitals:  Vitals:   02/22/18 1433 02/22/18 1459  BP: (!) 154/80 (!) 155/70  Pulse: 73 72  Resp: (!) 22 18  Temp:  36.5 C  SpO2: 97% 99%    Last Pain:  Vitals:   02/22/18 1459  TempSrc: Oral  PainSc: 0-No pain                 Lynda Rainwater

## 2018-02-23 DIAGNOSIS — N183 Chronic kidney disease, stage 3 (moderate): Secondary | ICD-10-CM

## 2018-02-23 LAB — CBC
HEMATOCRIT: 27.5 % — AB (ref 36.0–46.0)
HEMOGLOBIN: 9 g/dL — AB (ref 12.0–15.0)
MCH: 29.9 pg (ref 26.0–34.0)
MCHC: 32.7 g/dL (ref 30.0–36.0)
MCV: 91.4 fL (ref 80.0–100.0)
Platelets: 257 10*3/uL (ref 150–400)
RBC: 3.01 MIL/uL — ABNORMAL LOW (ref 3.87–5.11)
RDW: 15.9 % — AB (ref 11.5–15.5)
WBC: 11.5 10*3/uL — AB (ref 4.0–10.5)
nRBC: 0 % (ref 0.0–0.2)

## 2018-02-23 LAB — GLUCOSE, CAPILLARY: Glucose-Capillary: 110 mg/dL — ABNORMAL HIGH (ref 70–99)

## 2018-02-23 LAB — BASIC METABOLIC PANEL
Anion gap: 7 (ref 5–15)
BUN: 21 mg/dL (ref 8–23)
CALCIUM: 8 mg/dL — AB (ref 8.9–10.3)
CHLORIDE: 109 mmol/L (ref 98–111)
CO2: 24 mmol/L (ref 22–32)
CREATININE: 1.27 mg/dL — AB (ref 0.44–1.00)
GFR calc non Af Amer: 41 mL/min — ABNORMAL LOW (ref >60)
GFR, EST AFRICAN AMERICAN: 47 mL/min — AB (ref >60)
Glucose, Bld: 117 mg/dL — ABNORMAL HIGH (ref 70–99)
Potassium: 3.7 mmol/L (ref 3.5–5.1)
SODIUM: 140 mmol/L (ref 135–145)

## 2018-02-23 MED ORDER — PANTOPRAZOLE SODIUM 40 MG PO TBEC: DELAYED_RELEASE_TABLET | ORAL | 0 refills | Status: DC

## 2018-02-23 MED ORDER — ASPIRIN 81 MG PO CHEW
81.0000 mg | CHEWABLE_TABLET | Freq: Once | ORAL | Status: AC
  Administered 2018-02-23: 81 mg via ORAL
  Filled 2018-02-23: qty 1

## 2018-02-23 MED ORDER — CLOPIDOGREL BISULFATE 75 MG PO TABS: 75.0000 mg | ORAL_TABLET | Freq: Every day | ORAL | 3 refills | Status: DC

## 2018-02-23 NOTE — Progress Notes (Signed)
[facility] Gastroenterology Progress Note  KADISHA GOODINE 72 y.o. 08/01/1945   Subjective: Feels good. Denies rectal bleeding. Tolerating soup.  Objective: Vital signs: Vitals:   02/22/18 2121 02/23/18 0606  BP: (!) 126/53 (!) 128/51  Pulse: 70 69  Resp: 18 14  Temp: 98.4 F (36.9 C) 97.8 F (36.6 C)  SpO2: 97% 98%    Physical Exam: Gen: alert, no acute distress, obese, pleasant  HEENT: anicteric sclera CV: RRR Chest: CTA B Abd: soft, nontender, nondistended, +BS   Lab Results: Recent Labs    02/21/18 1648 02/23/18 0544  NA 138 140  K 3.8 3.7  CL 105 109  CO2 24 24  GLUCOSE 187* 117*  BUN 38* 21  CREATININE 1.50* 1.27*  CALCIUM 8.6* 8.0*   Recent Labs    02/21/18 1648  AST 23  ALT 16  ALKPHOS 77  BILITOT 0.7  PROT 6.3*  ALBUMIN 3.3*   Recent Labs    02/21/18 1648 02/22/18 0306 02/22/18 0834 02/23/18 0544  WBC 12.3* 12.6*  --  11.5*  NEUTROABS 8.3*  --   --   --   HGB 7.1* 9.5* 9.5* 9.0*  HCT 22.6* 30.5*  --  27.5*  MCV 93.0 91.6  --  91.4  PLT 307 268  --  257      Assessment/Plan: Symptomatic anemia - Hgb stable. No further GI workup needed. Soft diet. Ok to go home from GI standpoint on Plavix/ASA due to recent stent. F/U with me in 3-4 weeks. Outpt colonoscopy will be arranged at f/u. Will sign off. Call if questions.   Lear Ng 02/23/2018, 8:47 AM  Questions please call [phone] ID: Malena Catholic, female   DOB: May 10, 1945, 72 y.o.   MRN: [id number]

## 2018-02-23 NOTE — Progress Notes (Signed)
Progress Note  Patient Name: Karen Cooper Date of Encounter: 02/23/2018  Primary Cardiologist: Sanda Klein, MD   Subjective   Feels great today. Tolerated soup last night. No chest pain, dyspnea or dizziness.   Inpatient Medications    Scheduled Meds: . aspirin  81 mg Oral Once  . atorvastatin  80 mg Oral q1800  . carvedilol  6.25 mg Oral BID  . clopidogrel  75 mg Oral Daily  . Influenza vac split quadrivalent PF  0.5 mL Intramuscular Tomorrow-1000  . insulin aspart  0-9 Units Subcutaneous TID WC  . pantoprazole (PROTONIX) IV  40 mg Intravenous Q12H  . sacubitril-valsartan  1 tablet Oral BID   Continuous Infusions: . sodium chloride 50 mL/hr at 02/23/18 0600   PRN Meds: acetaminophen, furosemide, nitroGLYCERIN, ondansetron **OR** ondansetron (ZOFRAN) IV   Vital Signs    Vitals:   02/22/18 1433 02/22/18 1459 02/22/18 2121 02/23/18 0606  BP: (!) 154/80 (!) 155/70 (!) 126/53 (!) 128/51  Pulse: 73 72 70 69  Resp: (!) 22 18 18 14   Temp:  97.7 F (36.5 C) 98.4 F (36.9 C) 97.8 F (36.6 C)  TempSrc:  Oral Oral Oral  SpO2: 97% 99% 97% 98%  Weight:      Height:        Intake/Output Summary (Last 24 hours) at 02/23/2018 0757 Last data filed at 02/23/2018 0600 Gross per 24 hour  Intake 1655.04 ml  Output -  Net 1655.04 ml   Filed Weights   02/21/18 1623  Weight: 96.8 kg    Telemetry    NSR - Personally Reviewed  ECG    NSR with LBBB - Personally Reviewed  Physical Exam   GEN: No acute distress.   Neck: No JVD Cardiac: RRR, no murmurs, rubs, or gallops.  Respiratory: Clear to auscultation bilaterally. GI: Soft, nontender, non-distended  MS: No edema; No deformity. Neuro:  Nonfocal  Psych: Normal affect   Labs    Chemistry Recent Labs  Lab 02/21/18 1009 02/21/18 1648 02/23/18 0544  NA 142 138 140  K 4.7 3.8 3.7  CL 102 105 109  CO2 22 24 24   GLUCOSE 156* 187* 117*  BUN 39* 38* 21  CREATININE 1.45* 1.50* 1.27*  CALCIUM 8.9 8.6*  8.0*  PROT  --  6.3*  --   ALBUMIN  --  3.3*  --   AST  --  23  --   ALT  --  16  --   ALKPHOS  --  77  --   BILITOT  --  0.7  --   GFRNONAA 36* 33* 41*  GFRAA 41* 38* 47*  ANIONGAP  --  9 7     Hematology Recent Labs  Lab 02/21/18 1648 02/22/18 0306 02/22/18 0834 02/23/18 0544  WBC 12.3* 12.6*  --  11.5*  RBC 2.43* 3.33*  --  3.01*  HGB 7.1* 9.5* 9.5* 9.0*  HCT 22.6* 30.5*  --  27.5*  MCV 93.0 91.6  --  91.4  MCH 29.2 28.5  --  29.9  MCHC 31.4 31.1  --  32.7  RDW 16.0* 14.9  --  15.9*  PLT 307 268  --  257    Cardiac EnzymesNo results for input(s): TROPONINI in the last 168 hours. No results for input(s): TROPIPOC in the last 168 hours.   BNPNo results for input(s): BNP, PROBNP in the last 168 hours.   DDimer No results for input(s): DDIMER in the last 168 hours.   Radiology  No results found.  Cardiac Studies   Cath 01/18/2018  Mid RCA lesion is 30% stenosed.  Post intervention, there is a 0% residual stenosis.  Prox LAD to Mid LAD lesion is 80% stenosed.  A stent was successfully placed.  Predominant single-vessel CAD with 80% focal mid LAD stenosis after the takeoff of the first diagonal and septal perforating artery.  Normal ramus intermediate and left circumflex coronary artery.  Luminal irregularity of the RCA with smooth 30% mid stenosis.  LVEDP 10 mmHg.  Successful PCI to the mid LAD with ultimate insertion of a 3.0 x 15 mm Resolute Onyx DES stent postdilated to 3.29 mm with the 80% stenosis being reduced to 0%.  RECOMMENDATION: Medical therapy for concomitant CAD. Initiation of high potency statin therapy. Optimal blood pressure control with target blood pressure less than 130/80.  Recommend uninterrupted dual antiplatelet therapy with Aspirin 81mg  daily and Ticagrelor 90mg  twice dailyfor a minimum of 6 months (stable ischemic heart disease - Class I recommendation).  [name].  Assessment & Plan    1. Acute GI bleed. Hgb 6.9 improved to 9.0 with 4 units PRBCs. VSS. No overt bleeding last night. Results of EGD noted. On Protonix. Brilinta held and switched to plavix. With recent stent will resume ASA 81 mg daily today. Further plans per GI. 2. CAD s/p stenting of the LAD in October. No angina 3. LBBB 4. HTN controlled. Continue Entresto. 5. CKD. Creatinine improved to baseline.   [facility] will sign off.   Medication Recommendations:  ASA 81 mg daily. Plavix 75 mg daily. Otherwise per [facility] Other recommendations (labs, testing, etc):  none Follow up as an outpatient:  Follow up in our office 2 weeks.   For questions or updates, please contact [place] Please consult [email or web address] for contact info under        Signed,  [place], MD  02/23/2018, 7:57 AM

## 2018-02-23 NOTE — Consult Note (Signed)
            [facility] CM Primary Care Navigator  02/23/2018  Karen Cooper 1945-12-29 [id number]   Attempt to seepatient at the bedside to identify possible discharge needs butshewasalreadydischargedper staff. Patient was discharged home with home health services.  Per MD note,patientwas sent from her cardiologist's office for evaluation of anemia for hemoglobin of 6.9. She was admitted for further work-updue to feeling weak and swimmy-headed which required blood transfusion. Her FOBT (fecal occult blood test) was positive. Gastroenterology was consulted. (acute GI bleeding)  Patient has discharge instruction to follow-up withprimary care provider in 1 week and gastroenterology follow-up in 3 weeks.  Primary care provider's office is listed as providing transition of care (TOC) follow-up.    For additional questions please contact:  Edwena Felty A. [place] Dimmitt, BSN, RN-BC [facility] PRIMARY CARE Navigator Cell: [phone]

## 2018-02-23 NOTE — Discharge Summary (Signed)
Karen Cooper, is a 72 y.o. female  DOB [birth year]  MRN [id number].  Admission date:  02/21/2018  Admitting Physician  Merton Border, MD  Discharge Date:  02/23/2018   Primary MD  Holland Commons, FNP  Recommendations for primary care physician for things to follow:  -Please check CBC, BMP in 1 week   Admission Diagnosis  Acute GI bleeding [K92.2]   Discharge Diagnosis  Acute GI bleeding [K92.2]    Active Problems:   CAD (coronary artery disease), native coronary artery   Essential hypertension   Dyslipidemia   CKD (chronic kidney disease) stage 3, GFR 30-59 ml/min (HCC)   Diabetes type 2, controlled ([place])   Acute GI bleeding      Past Medical History:  Diagnosis Date  . Coronary artery disease   . Hypertension   . Type II diabetes mellitus ([place])     Past Surgical History:  Procedure Laterality Date  . BACK SURGERY    . CHOLECYSTECTOMY OPEN  1972  . CORONARY ANGIOPLASTY WITH STENT PLACEMENT  01/18/2018  . CORONARY STENT INTERVENTION N/A 01/18/2018   Procedure: CORONARY STENT INTERVENTION;  Surgeon: Troy Sine, MD;  Location: [facility];  Service: Cardiovascular;  Laterality: N/A;  . ESOPHAGOGASTRODUODENOSCOPY  02/22/2018  . ESOPHAGOGASTRODUODENOSCOPY (EGD) WITH PROPOFOL N/A 02/22/2018   Procedure: ESOPHAGOGASTRODUODENOSCOPY (EGD) WITH PROPOFOL;  Surgeon: Wilford Corner, MD;  Location: [facility];  Service: Endoscopy;  Laterality: N/A;  . INCISION AND DRAINAGE  2004   "infection had settled in my back"  . LEFT HEART CATH AND CORONARY ANGIOGRAPHY N/A 01/18/2018   Procedure: LEFT HEART CATH AND CORONARY ANGIOGRAPHY;  Surgeon: Troy Sine, MD;  Location: [facility];  Service: Cardiovascular;  Laterality: N/A;  . [place]   ruptured disk  . TONSILLECTOMY         History of present illness and  Hospital Course:     Kindly see H&P for  history of present illness and admission details, please review complete Labs, Consult reports and Test reports for all details in brief  HPI  from the history and physical done on the day of admission 02/21/2018  Karen Cooper  is a 72 y.o. female, with past medical history significant for coronary artery disease status post stent placement on Brilinta and aspirin, hypertension and diabetes mellitus type 2 who was sent from her cardiologist's office for evaluation of anemia, hemoglobin was 6.9.  The patient has been feeling weak especially in the last 2 days.  She said that she feels how it is to be drunk since her head feels like swimming.  In the emergency room the patient hemoglobin was noted to be 7.1 and she is in the process of being transfused 2 units of packed RBCs.  Her FOBT was positive.  Gastroenterology Dr. Alessandra Bevels was consulted and the patient will be admitted for further work-up.  Patient denies any chest pains but complain of episodic shortness of breath.  No abdominal pain, nausea or vomiting  Hospital Course   72 y.o.female,with past medical history significant for coronary artery disease status post stent placement in the LAD October 2019, on Brilinta and aspirin, hypertension and diabetes mellitus type 2 who was sent from her cardiologist's office for evaluation of anemia, hemoglobin was 6.9.  Occult positive stool, she was transfused 2 units PRBC, admitted for further work-up.  Upper GI bleed -Presents with hemoglobin of 6.9, recently started on Brilinta and aspirin, denies any other NSAIDs use. -Never had endoscopy/colonoscopy in the past, -Went for endoscopy Dr. Hubbard Hartshorn 02/22/2018, significant for tiny gastric ulcer, mild gastritis, Karen Cooper remained stable, please see discussion below regarding antiplatelet therapy. -Will be discharged on Protonix, with recommendation to follow CBC next week, and to follow with GI as an outpatient regarding colonoscopy as an  outpatient.  Acute blood loss anemia -Required total 2 units PRBC transfusion during hospital stay, hemoglobin 9 at time of discharge, stable over last 24 hours, no evidence of recurrent GI bleed. -Discharged on iron supplements  CAD -With known stent placement to LAD last month, Brilinta and aspirin, Brilinta has been transitioned to Plavix as lower [place] for bleed, she is discharged on Plavix and aspirin. -Continue Coreg, Lipitor, Entresto   Hyperlipidemia continue with Lipitor  Hypertension continue with Coreg  Diabetes mellitus type 2, controlled -Continue with home meds  Renal insufficiency stage III  chronic   Discharge Condition:    Follow UP  Follow-up Information    Holland Commons, FNP Follow up in 1 week(s).   Specialty:  Internal Medicine Contact information: [address] [place] [address] [phone]        Wilford Corner, MD Follow up in 3 week(s).   Specialty:  Gastroenterology Contact information: [address] [place] [address] [phone]             Discharge Instructions  and  Discharge Medications     Discharge Instructions    Discharge instructions   Complete by:  As directed    Follow with Primary MD Holland Commons, FNP in 7 days   Get CBC, CMP, 2 view Chest X ray checked  by Primary MD next visit.    Activity: As tolerated with Full fall precautions use walker/cane & assistance as needed   Disposition Home    Diet: Heart Healthy  , with feeding assistance and aspiration precautions.  For Heart failure patients - Check your Weight same time everyday, if you gain over 2 pounds, or you develop in leg swelling, experience more shortness of breath or chest pain, call your Primary MD immediately. Follow Cardiac Low Salt Diet and 1.5 lit/day fluid restriction.   On your next visit with your primary care physician please Get Medicines reviewed and  adjusted.   Please request your Prim.MD to go over all Hospital Tests and Procedure/Radiological results at the follow up, please get all Hospital records sent to your Prim MD by signing hospital release before you go home.   If you experience worsening of your admission symptoms, develop shortness of breath, life threatening emergency, suicidal or homicidal thoughts you must seek medical attention immediately by calling 911 or calling your MD immediately  if symptoms less severe.  You Must read complete instructions/literature along with all the possible adverse reactions/side effects for all the Medicines you take and that have been prescribed to you. Take any new Medicines after you have completely understood and accpet all the possible adverse reactions/side effects.   Do not drive,  operating heavy machinery, perform activities at heights, swimming or participation in water activities or provide baby sitting services if your were admitted for syncope or siezures until you have seen by Primary MD or a Neurologist and advised to do so again.  Do not drive when taking Pain medications.    Do not take more than prescribed Pain, Sleep and Anxiety Medications  Special Instructions: If you have smoked or chewed Tobacco  in the last 2 yrs please stop smoking, stop any regular Alcohol  and or any Recreational drug use.  Wear Seat belts while driving.   Please note  You were cared for by a hospitalist during your hospital stay. If you have any questions about your discharge medications or the care you received while you were in the hospital after you are discharged, you can call the unit and asked to speak with the hospitalist on call if the hospitalist that took care of you is not available. Once you are discharged, your primary care physician will handle any further medical issues. Please note that NO REFILLS for any discharge medications will be authorized once you are discharged, as it is  imperative that you return to your primary care physician (or establish a relationship with a primary care physician if you do not have one) for your aftercare needs so that they can reassess your need for medications and monitor your lab values.   Increase activity slowly   Complete by:  As directed      Allergies as of 02/23/2018   No Known Allergies     Medication List    STOP taking these medications   ticagrelor 90 MG Tabs tablet Commonly known as:  BRILINTA     TAKE these medications   acetaminophen 325 MG tablet Commonly known as:  TYLENOL Take 325-650 mg by mouth every 6 (six) hours as needed for mild pain or headache.   aspirin 81 MG chewable tablet Chew 1 tablet (81 mg total) by mouth daily.   atorvastatin 80 MG tablet Commonly known as:  LIPITOR Take 1 tablet (80 mg total) by mouth daily at 6 PM.   carvedilol 6.25 MG tablet Commonly known as:  COREG Take 1 tablet (6.25 mg total) by mouth 2 (two) times daily.   clopidogrel 75 MG tablet Commonly known as:  PLAVIX Take 1 tablet (75 mg total) by mouth daily. Start taking on:  02/24/2018   furosemide 20 MG tablet Commonly known as:  LASIX Take 1 tablet (20 mg total) by mouth as needed for edema.   metFORMIN 750 MG 24 hr tablet Commonly known as:  GLUCOPHAGE-XR Take 1,500 mg by mouth daily with supper.   nitroGLYCERIN 0.4 MG SL tablet Commonly known as:  NITROSTAT Place 1 tablet (0.4 mg total) under the tongue every 5 (five) minutes as needed for chest pain.   pantoprazole 40 MG tablet Commonly known as:  PROTONIX Take 40 mg oral twice daily for 6 weeks, then continue 40 mg once daily after that   sacubitril-valsartan 49-51 MG Commonly known as:  ENTRESTO Take 1 tablet by mouth 2 (two) times daily.   Vitamin D3 25 MCG (1000 UT) Caps Take 2,000 Units by mouth daily.         Diet and Activity recommendation: See Discharge Instructions above   Consults obtained -  Gastroenterology Dr.  Michail Sermon cardiology   Major procedures and Radiology Reports - PLEASE review detailed and final reports for all details, in brief -  No results found.  Micro Results     No results found for this or any previous visit (from the past 240 hour(s)).     Today   Subjective:   Kayna Suppa today has no headache,no chest abdominal pain,no new weakness tingling or numbness, feels much better wants to go home today.   Objective:   Blood pressure (!) 128/51, pulse 69, temperature 97.8 F (36.6 C), temperature source Oral, resp. rate 14, height 5\' 4"  (1.626 m), weight 96.8 kg, SpO2 98 %.   Intake/Output Summary (Last 24 hours) at 02/23/2018 1547 Last data filed at 02/23/2018 1000 Gross per 24 hour  Intake 1215.04 ml  Output -  Net 1215.04 ml    Exam Awake Alert, Oriented x 3, No new F.N deficits, Normal affect Symmetrical Chest wall movement, Good air movement bilaterally, CTAB RRR,No Gallops,Rubs or new Murmurs, No Parasternal Heave +ve B.Sounds, Abd Soft, Non tender, No organomegaly appriciated, No rebound -guarding or rigidity. No Cyanosis, Clubbing or edema, No new Rash or bruise  Data Review   CBC w Diff:  Lab Results  Component Value Date   WBC 11.5 (H) 02/23/2018   HGB 9.0 (L) 02/23/2018   HGB 6.9 (LL) 02/21/2018   HCT 27.5 (L) 02/23/2018   HCT 20.7 (L) 02/21/2018   PLT 257 02/23/2018   PLT 324 02/21/2018   LYMPHOPCT 21 02/21/2018   MONOPCT 9 02/21/2018   EOSPCT 2 02/21/2018   BASOPCT 0 02/21/2018    CMP:  Lab Results  Component Value Date   NA 140 02/23/2018   NA 142 02/21/2018   K 3.7 02/23/2018   CL 109 02/23/2018   CO2 24 02/23/2018   BUN 21 02/23/2018   BUN 39 (H) 02/21/2018   CREATININE 1.27 (H) 02/23/2018   PROT 6.3 (L) 02/21/2018   PROT 6.1 02/10/2018   ALBUMIN 3.3 (L) 02/21/2018   ALBUMIN 3.8 02/10/2018   BILITOT 0.7 02/21/2018   BILITOT 0.5 02/10/2018   ALKPHOS 77 02/21/2018   AST 23 02/21/2018   ALT 16 02/21/2018   .   Total Time in preparing paper work, data evaluation and todays exam - 53 minutes  Phillips Climes M.D on 02/23/2018 at 3:47 PM  [facility]   Office  [phone]

## 2018-02-23 NOTE — Discharge Instructions (Signed)
Follow with Primary MD Holland Commons, FNP in 7 days   Get CBC, CMP, 2 view Chest X ray checked  by Primary MD next visit.    Activity: As tolerated with Full fall precautions use walker/cane & assistance as needed   Disposition Home    Diet: Heart Healthy  , with feeding assistance and aspiration precautions.  For Heart failure patients - Check your Weight same time everyday, if you gain over 2 pounds, or you develop in leg swelling, experience more shortness of breath or chest pain, call your Primary MD immediately. Follow Cardiac Low Salt Diet and 1.5 lit/day fluid restriction.   On your next visit with your primary care physician please Get Medicines reviewed and adjusted.   Please request your Prim.MD to go over all Hospital Tests and Procedure/Radiological results at the follow up, please get all Hospital records sent to your Prim MD by signing hospital release before you go home.   If you experience worsening of your admission symptoms, develop shortness of breath, life threatening emergency, suicidal or homicidal thoughts you must seek medical attention immediately by calling 911 or calling your MD immediately  if symptoms less severe.  You Must read complete instructions/literature along with all the possible adverse reactions/side effects for all the Medicines you take and that have been prescribed to you. Take any new Medicines after you have completely understood and accpet all the possible adverse reactions/side effects.   Do not drive, operating heavy machinery, perform activities at heights, swimming or participation in water activities or provide baby sitting services if your were admitted for syncope or siezures until you have seen by Primary MD or a Neurologist and advised to do so again.  Do not drive when taking Pain medications.    Do not take more than prescribed Pain, Sleep and Anxiety Medications  Special Instructions: If you have smoked or chewed Tobacco   in the last 2 yrs please stop smoking, stop any regular Alcohol  and or any Recreational drug use.  Wear Seat belts while driving.   Please note  You were cared for by a hospitalist during your hospital stay. If you have any questions about your discharge medications or the care you received while you were in the hospital after you are discharged, you can call the unit and asked to speak with the hospitalist on call if the hospitalist that took care of you is not available. Once you are discharged, your primary care physician will handle any further medical issues. Please note that NO REFILLS for any discharge medications will be authorized once you are discharged, as it is imperative that you return to your primary care physician (or establish a relationship with a primary care physician if you do not have one) for your aftercare needs so that they can reassess your need for medications and monitor your lab values.

## 2018-02-24 LAB — BPAM RBC
BLOOD PRODUCT EXPIRATION DATE: 201912112359
BLOOD PRODUCT EXPIRATION DATE: 201912112359
Blood Product Expiration Date: 201912112359
Blood Product Expiration Date: 201912112359
ISSUE DATE / TIME: 201911121841
ISSUE DATE / TIME: 201911122049
Unit Type and Rh: 5100
Unit Type and Rh: 5100
Unit Type and Rh: 5100
Unit Type and Rh: 5100

## 2018-02-24 LAB — TYPE AND SCREEN
ABO/RH(D): O POS
ANTIBODY SCREEN: NEGATIVE
UNIT DIVISION: 0
Unit division: 0
Unit division: 0
Unit division: 0

## 2018-03-07 ENCOUNTER — Ambulatory Visit (INDEPENDENT_AMBULATORY_CARE_PROVIDER_SITE_OTHER): Payer: Medicare Other | Admitting: Physician Assistant

## 2018-03-07 ENCOUNTER — Encounter: Payer: Self-pay | Admitting: Physician Assistant

## 2018-03-07 VITALS — BP 146/60 | HR 87 | Ht 64.0 in | Wt 228.0 lb

## 2018-03-07 DIAGNOSIS — N183 Chronic kidney disease, stage 3 unspecified: Secondary | ICD-10-CM

## 2018-03-07 DIAGNOSIS — I447 Left bundle-branch block, unspecified: Secondary | ICD-10-CM | POA: Diagnosis not present

## 2018-03-07 DIAGNOSIS — I255 Ischemic cardiomyopathy: Secondary | ICD-10-CM | POA: Diagnosis not present

## 2018-03-07 DIAGNOSIS — I2510 Atherosclerotic heart disease of native coronary artery without angina pectoris: Secondary | ICD-10-CM

## 2018-03-07 DIAGNOSIS — I10 Essential (primary) hypertension: Secondary | ICD-10-CM | POA: Diagnosis not present

## 2018-03-07 DIAGNOSIS — K922 Gastrointestinal hemorrhage, unspecified: Secondary | ICD-10-CM

## 2018-03-07 DIAGNOSIS — I5023 Acute on chronic systolic (congestive) heart failure: Secondary | ICD-10-CM | POA: Diagnosis not present

## 2018-03-07 LAB — CBC WITH DIFFERENTIAL/PLATELET
BASOS: 1 %
Basophils Absolute: 0.1 10*3/uL (ref 0.0–0.2)
EOS (ABSOLUTE): 0.4 10*3/uL (ref 0.0–0.4)
EOS: 3 %
HEMATOCRIT: 27.3 % — AB (ref 34.0–46.6)
Hemoglobin: 9.1 g/dL — ABNORMAL LOW (ref 11.1–15.9)
IMMATURE GRANS (ABS): 0 10*3/uL (ref 0.0–0.1)
IMMATURE GRANULOCYTES: 0 %
LYMPHS: 19 %
Lymphocytes Absolute: 2.3 10*3/uL (ref 0.7–3.1)
MCH: 30.4 pg (ref 26.6–33.0)
MCHC: 33.3 g/dL (ref 31.5–35.7)
MCV: 91 fL (ref 79–97)
Monocytes Absolute: 1.1 10*3/uL — ABNORMAL HIGH (ref 0.1–0.9)
Monocytes: 9 %
Neutrophils Absolute: 8.5 10*3/uL — ABNORMAL HIGH (ref 1.4–7.0)
Neutrophils: 68 %
Platelets: 261 10*3/uL (ref 150–450)
RBC: 2.99 x10E6/uL — ABNORMAL LOW (ref 3.77–5.28)
RDW: 13.9 % (ref 12.3–15.4)
WBC: 12.3 10*3/uL — ABNORMAL HIGH (ref 3.4–10.8)

## 2018-03-07 MED ORDER — SACUBITRIL-VALSARTAN 97-103 MG PO TABS: 1.0000 | ORAL_TABLET | Freq: Two times a day (BID) | ORAL | 6 refills | Status: DC

## 2018-03-07 NOTE — Progress Notes (Signed)
Cardiology Office Note    Date:  03/07/2018   ID:  Karen Cooper, DOB March 26, 1946, MRN [id number]  PCP:  Holland Commons, FNP  Cardiologist:  Dr. Sallyanne Kuster  Chief Complaint  Patient presents with  . Hospitalization Follow-up    seen for Dr. Sallyanne Kuster    History of Present Illness:  Karen Cooper is a 72 y.o. female with PMH ofCKD stage III,HTNand remote history of smoking was recently noted to have new left bundle branch block. Both her echo and Myoview showed decreased LV systolic function with EF of around 40%. Myoviewalso showed a fixed anteroseptal and apical defect consistent with left bundle branch block but no ischemia. Echo suggested akinesis of the basal mid anteroseptal myocardium. To further assess, she underwent cardiac catheterization on 01/18/2018 which showed single-vessel CAD with 80% focal mid LAD stenosis treated with 3.0 x 15 mm resolute Onyx DES postdilated to 3.29 mm. Patient was placed on high-dose statin. Note, patient has quit smoking since December 2018.  I last saw the patient on 02/21/2018 for dizziness, lab work showed she was severely anemic with hemoglobin of 6.9.  She was admitted to the hospital for acute GI bleed.  She was transfused with packed red blood cell.  We switched her Brilinta to Plavix to decrease bleeding risk.  Aspirin was on hold temporarily prior to EGD.  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  It was recommended for the patient to proceed with outpatient colonoscopy.  She was placed on Protonix.  Aspirin resumed after EGD.  GI plan to proceed with outpatient colonoscopy and possible capsule endoscopy if bleeding continues.  Patient presents today for cardiology office visit.  She has been doing well since the blood transfusion.  She denies any significant dizziness or fatigue anymore.  She is doing well on the  current aspirin and Plavix without any further evidence of bleeding.  For the past 3 days she did develop a left lower extremity edema.  She says she usually developed edema only on one side and the edema looks better after a night's rest and leg elevation.  I still think she is mildly volume overloaded and instructed her to take 40 mg of Lasix for 2 days before going back to as needed dose of Lasix.  She is aware that if her lower extremity edema does not improve, she will need to let us know so we can order a venous Doppler to rule out DVT.  Her weight is also quite elevated as well, I think she increase diuretic should help in this case.  Otherwise her blood pressure remain elevated, I will increase Entresto to 97-103 mg twice daily.  She can follow-up with Dr. Sallyanne Kuster in 2 to 90-month, at which time Dr. Sallyanne Kuster may decide to order a repeat echocardiogram.   Past Medical History:  Diagnosis Date  . Coronary artery disease   . Hypertension   . Type II diabetes mellitus ([place])     Past Surgical History:  Procedure Laterality Date  . BACK SURGERY    . CHOLECYSTECTOMY OPEN  1972  . CORONARY ANGIOPLASTY WITH STENT PLACEMENT  01/18/2018  . CORONARY STENT INTERVENTION N/A 01/18/2018   Procedure: CORONARY STENT INTERVENTION;  Surgeon: Troy Sine, MD;  Location: [facility];  Service: Cardiovascular;  Laterality: N/A;  . ESOPHAGOGASTRODUODENOSCOPY  02/22/2018  . ESOPHAGOGASTRODUODENOSCOPY (EGD) WITH  PROPOFOL N/A 02/22/2018   Procedure: ESOPHAGOGASTRODUODENOSCOPY (EGD) WITH PROPOFOL;  Surgeon: Wilford Corner, MD;  Location: [facility];  Service: Endoscopy;  Laterality: N/A;  . INCISION AND DRAINAGE  2004   "infection had settled in my back"  . LEFT HEART CATH AND CORONARY ANGIOGRAPHY N/A 01/18/2018   Procedure: LEFT HEART CATH AND CORONARY ANGIOGRAPHY;  Surgeon: Troy Sine, MD;  Location: [facility];  Service: Cardiovascular;  Laterality: N/A;  . [place]    ruptured disk  . TONSILLECTOMY      Current Medications: Outpatient Medications Prior to Visit  Medication Sig Dispense Refill  . acetaminophen (TYLENOL) 325 MG tablet Take 325-650 mg by mouth every 6 (six) hours as needed for mild pain or headache.    Marland Kitchen aspirin 81 MG chewable tablet Chew 1 tablet (81 mg total) by mouth daily. 90 tablet 3  . atorvastatin (LIPITOR) 80 MG tablet Take 1 tablet (80 mg total) by mouth daily at 6 PM. 90 tablet 3  . carvedilol (COREG) 6.25 MG tablet Take 1 tablet (6.25 mg total) by mouth 2 (two) times daily. 60 tablet 3  . Cholecalciferol (VITAMIN D3) 1000 units CAPS Take 2,000 Units by mouth daily.     . clopidogrel (PLAVIX) 75 MG tablet Take 1 tablet (75 mg total) by mouth daily. 30 tablet 3  . furosemide (LASIX) 20 MG tablet Take 1 tablet (20 mg total) by mouth as needed for edema. 30 tablet 0  . metFORMIN (GLUCOPHAGE-XR) 750 MG 24 hr tablet Take 1,500 mg by mouth daily with supper.   0  . nitroGLYCERIN (NITROSTAT) 0.4 MG SL tablet Place 1 tablet (0.4 mg total) under the tongue every 5 (five) minutes as needed for chest pain. 25 tablet 3  . pantoprazole (PROTONIX) 40 MG tablet Take 40 mg oral twice daily for 6 weeks, then continue 40 mg once daily after that 120 tablet 0  . sacubitril-valsartan (ENTRESTO) 49-51 MG Take 1 tablet by mouth 2 (two) times daily. 60 tablet 2   No facility-administered medications prior to visit.      Allergies:   Patient has no known allergies.   Social History   Socioeconomic History  . Marital status: Married    Spouse name: Not on file  . Number of children: Not on file  . Years of education: Not on file  . Highest education level: Not on file  Occupational History  . Not on file  Social Needs  . Financial resource strain: Not on file  . Food insecurity:    Worry: Not on file    Inability: Not on file  . Transportation needs:    Medical: Not on file    Non-medical: Not on file  Tobacco Use  . Smoking status:  Former Smoker    Packs/day: 1.00    Years: 40.00    Pack years: 40.00    Types: Cigarettes    Last attempt to quit: 04/03/2017    Years since quitting: 0.9  . Smokeless tobacco: Never Used  Substance and Sexual Activity  . Alcohol use: Never    Frequency: Never  . Drug use: Never  . Sexual activity: Not Currently  Lifestyle  . Physical activity:    Days per week: Not on file    Minutes per session: Not on file  . Stress: Not on file  Relationships  . Social connections:    Talks on phone: Not on file    Gets together: Not on  file    Attends religious service: Not on file    Active member of club or organization: Not on file    Attends meetings of clubs or organizations: Not on file    Relationship status: Not on file  Other Topics Concern  . Not on file  Social History Narrative  . Not on file     Family History:  The patient's family history includes Cancer in her sister; Dementia in her mother; Diabetes in her brother; Heart attack in her brother and brother; Heart disease in her father; Heart failure in her mother.   ROS:   Please see the history of present illness.    ROS All other systems reviewed and are negative.   PHYSICAL EXAM:   VS:  BP (!) 146/60   Pulse 87   Ht 5\' 4"  (1.626 m)   Wt 228 lb (103.4 kg)   SpO2 96%   BMI 39.14 kg/m    GEN: Well nourished, well developed, in no acute distress  HEENT: normal  Neck: no JVD, carotid bruits, or masses Cardiac: RRR; no murmurs, rubs, or gallops. 2+ LLE edema  Respiratory:  clear to auscultation bilaterally, normal work of breathing GI: soft, nontender, nondistended, + BS MS: no deformity or atrophy  Skin: warm and dry, no rash Neuro:  Alert and Oriented x 3, Strength and sensation are intact Psych: euthymic mood, full affect  Wt Readings from Last 3 Encounters:  03/07/18 228 lb (103.4 kg)  02/21/18 213 lb 6.4 oz (96.8 kg)  02/21/18 213 lb 6.4 oz (96.8 kg)      Studies/Labs Reviewed:   EKG:  EKG is  not ordered today.    Recent Labs: 02/21/2018: ALT 16 02/23/2018: BUN 21; Creatinine, Ser 1.27; Hemoglobin 9.0; Platelets 257; Potassium 3.7; Sodium 140   Lipid Panel    Component Value Date/Time   CHOL 78 (L) 02/10/2018 0921   TRIG 131 02/10/2018 0921   HDL 34 (L) 02/10/2018 0921   CHOLHDL 2.3 02/10/2018 0921   LDLCALC 18 02/10/2018 0921    Additional studies/ records that were reviewed today include:   Echo 01/10/2018 LV EF: 35% -   40% Study Conclusions  - Left ventricle: The cavity size was normal. There was mild focal   basal hypertrophy of the septum. Systolic function was moderately   reduced. The estimated ejection fraction was in the range of 35%   to 40%. Diffuse hypokinesis. There is akinesis of the   basal-midanteroseptal myocardium. The study is not technically   sufficient to allow evaluation of LV diastolic function. Frequent   ectopy. - Ventricular septum: Septal motion showed abnormal function,   dyssynergy, and &quot;bounce&quot;. - Aortic valve: Trileaflet; mildly thickened, mildly calcified   leaflets. There was trivial regurgitation. - Pulmonary arteries: Systolic pressure was mildly increased.   Cath 01/18/2018  Mid RCA lesion is 30% stenosed.  Post intervention, there is a 0% residual stenosis.  Prox LAD to Mid LAD lesion is 80% stenosed.  A stent was successfully placed.   Predominant single-vessel CAD with 80% focal mid LAD stenosis after the takeoff of the first diagonal and septal perforating artery.  Normal ramus intermediate and left circumflex coronary artery.  Luminal irregularity of the RCA with smooth 30% mid stenosis.  LVEDP 10 mmHg.  Successful PCI to the mid LAD with ultimate insertion of a 3.0 x 15 mm Resolute Onyx DES stent postdilated to 3.29 mm with the 80% stenosis being reduced to 0%.  RECOMMENDATION: Medical therapy  for concomitant CAD.  Initiation of high potency statin therapy.  Optimal blood pressure control  with target blood pressure less than 130/80.  Recommend uninterrupted dual antiplatelet therapy with Aspirin 81mg  daily and Ticagrelor 90mg  twice daily for a minimum of 6 months (stable ischemic heart disease - Class I recommendation).  ASSESSMENT:    1. Acute on chronic systolic heart failure ([place])   2. Gastrointestinal hemorrhage, unspecified gastrointestinal hemorrhage type   3. CKD (chronic kidney disease), stage III ([place])   4. Essential hypertension   5. LBBB (left bundle branch block)   6. Ischemic cardiomyopathy   7. Coronary artery disease involving native coronary artery of native heart without angina pectoris      PLAN:  In order of problems listed above:  1. Acute on chronic systolic heart failure: She appears to be volume overloaded, her weight has increased more than 10 pounds compared to her previous weight.  She has significant left lower extremity edema, although her lower extremity edema is more ipsilateral, however she does not have any sign of DVT nor is her left lower extremity tender.  She has evidence of volume overload, I recommend she take 40 mg daily of Lasix for 2 days before going back to as needed dose of diuretic.  If her lower extremity edema does not improve, then I will consider a venous Doppler  2. GI bleed: Recently required blood transfusion.  Brilinta has since been transitioned to Plavix for lower bleeding profile.  She will need follow-up with GI service  3. Hypertension: Blood pressure remain elevated, increase Entresto to 97-103 mg twice daily  4. Ischemic cardiomyopathy: Patient is on both carvedilol and Entresto.  We will continue to uptitrate Entresto dosage.  Consider repeat echocardiogram on follow-up  5. CAD: She recently had DES placed in proximal LAD.  Brilinta has been switched to Plavix due to GI bleed.  She will continue on aspirin.   6. CKD stage III: Stable renal function on the last lab work.    Medication Adjustments/Labs and  Tests Ordered: Current medicines are reviewed at length with the patient today.  Concerns regarding medicines are outlined above.  Medication changes, Labs and Tests ordered today are listed in the Patient Instructions below. Patient Instructions  Medication Instructions:  Take Entresto 49/51 mg  2 tablets twice a day when finished start new prescription Entresto 97/103 mg twice a day   Take Lasix 40 mg daily for next 2 days then return to normal dose   If you need a refill on your cardiac medications before your next appointment, please call your pharmacy.   Lab work: None ordered   Testing/Procedures: None ordered  Follow-Up: At [facility], you and your health needs are our priority.  As part of our continuing mission to provide you with exceptional heart care, we have created designated Provider Care Teams.  These Care Teams include your primary Cardiologist (physician) and Advanced Practice Providers (APPs -  Physician Assistants and Nurse Practitioners) who all work together to provide you with the care you need, when you need it. . Schedule follow up in 3 months with Dr.Croitoru . Let us know if your legs don't improve  . Schedule follow up appointment with GI      Hilbert Corrigan, PA  03/07/2018 8:39 AM    [address], [place], [place]  [address] Phone: [phone]; Fax: [phone]

## 2018-03-07 NOTE — Progress Notes (Signed)
Thanks, Isaac Laud. She has had a lot of issues since diagnosis MCr

## 2018-03-07 NOTE — Patient Instructions (Addendum)
Medication Instructions:  Take Entresto 49/51 mg  2 tablets twice a day when finished start new prescription Entresto 97/103 mg twice a day   Take Lasix 40 mg daily for next 2 days then return to normal dose   If you need a refill on your cardiac medications before your next appointment, please call your pharmacy.   Lab work: CBC today  Testing/Procedures: None ordered  Follow-Up: At [facility], you and your health needs are our priority.  As part of our continuing mission to provide you with exceptional heart care, we have created designated Provider Care Teams.  These Care Teams include your primary Cardiologist (physician) and Advanced Practice Providers (APPs -  Physician Assistants and Nurse Practitioners) who all work together to provide you with the care you need, when you need it. . Schedule follow up in 3 months with Dr.Croitoru . Let us know if your legs don't improve  . Schedule follow up appointment with GI

## 2018-04-12 DIAGNOSIS — D649 Anemia, unspecified: Secondary | ICD-10-CM

## 2018-04-12 HISTORY — DX: Anemia, unspecified: D64.9

## 2018-04-25 DIAGNOSIS — I129 Hypertensive chronic kidney disease with stage 1 through stage 4 chronic kidney disease, or unspecified chronic kidney disease: Secondary | ICD-10-CM | POA: Diagnosis not present

## 2018-04-25 DIAGNOSIS — I10 Essential (primary) hypertension: Secondary | ICD-10-CM | POA: Diagnosis not present

## 2018-04-25 DIAGNOSIS — E1122 Type 2 diabetes mellitus with diabetic chronic kidney disease: Secondary | ICD-10-CM | POA: Diagnosis not present

## 2018-04-25 DIAGNOSIS — E785 Hyperlipidemia, unspecified: Secondary | ICD-10-CM | POA: Diagnosis not present

## 2018-04-25 DIAGNOSIS — E559 Vitamin D deficiency, unspecified: Secondary | ICD-10-CM | POA: Diagnosis not present

## 2018-04-26 ENCOUNTER — Inpatient Hospital Stay (HOSPITAL_COMMUNITY): Payer: Medicare Other

## 2018-04-26 ENCOUNTER — Other Ambulatory Visit: Payer: Self-pay

## 2018-04-26 ENCOUNTER — Encounter (HOSPITAL_COMMUNITY): Payer: Self-pay

## 2018-04-26 ENCOUNTER — Inpatient Hospital Stay (HOSPITAL_COMMUNITY)
Admission: EM | Admit: 2018-04-26 | Discharge: 2018-04-28 | DRG: 378 | Disposition: A | Discharge status: Home / Self Care | Payer: Medicare Other | Attending: Internal Medicine | Admitting: Internal Medicine

## 2018-04-26 DIAGNOSIS — Z6836 Body mass index (BMI) 36.0-36.9, adult: Secondary | ICD-10-CM | POA: Diagnosis not present

## 2018-04-26 DIAGNOSIS — I2510 Atherosclerotic heart disease of native coronary artery without angina pectoris: Secondary | ICD-10-CM | POA: Diagnosis not present

## 2018-04-26 DIAGNOSIS — Z833 Family history of diabetes mellitus: Secondary | ICD-10-CM

## 2018-04-26 DIAGNOSIS — N183 Chronic kidney disease, stage 3 unspecified: Secondary | ICD-10-CM | POA: Diagnosis present

## 2018-04-26 DIAGNOSIS — Z9049 Acquired absence of other specified parts of digestive tract: Secondary | ICD-10-CM

## 2018-04-26 DIAGNOSIS — E785 Hyperlipidemia, unspecified: Secondary | ICD-10-CM | POA: Diagnosis present

## 2018-04-26 DIAGNOSIS — K635 Polyp of colon: Secondary | ICD-10-CM | POA: Diagnosis present

## 2018-04-26 DIAGNOSIS — Z8249 Family history of ischemic heart disease and other diseases of the circulatory system: Secondary | ICD-10-CM

## 2018-04-26 DIAGNOSIS — Z7902 Long term (current) use of antithrombotics/antiplatelets: Secondary | ICD-10-CM

## 2018-04-26 DIAGNOSIS — I10 Essential (primary) hypertension: Secondary | ICD-10-CM | POA: Diagnosis present

## 2018-04-26 DIAGNOSIS — Z7982 Long term (current) use of aspirin: Secondary | ICD-10-CM | POA: Diagnosis not present

## 2018-04-26 DIAGNOSIS — I130 Hypertensive heart and chronic kidney disease with heart failure and stage 1 through stage 4 chronic kidney disease, or unspecified chronic kidney disease: Secondary | ICD-10-CM | POA: Diagnosis present

## 2018-04-26 DIAGNOSIS — N179 Acute kidney failure, unspecified: Secondary | ICD-10-CM | POA: Diagnosis present

## 2018-04-26 DIAGNOSIS — Z955 Presence of coronary angioplasty implant and graft: Secondary | ICD-10-CM

## 2018-04-26 DIAGNOSIS — Z79899 Other long term (current) drug therapy: Secondary | ICD-10-CM

## 2018-04-26 DIAGNOSIS — D649 Anemia, unspecified: Secondary | ICD-10-CM | POA: Diagnosis present

## 2018-04-26 DIAGNOSIS — D631 Anemia in chronic kidney disease: Secondary | ICD-10-CM | POA: Diagnosis not present

## 2018-04-26 DIAGNOSIS — I255 Ischemic cardiomyopathy: Secondary | ICD-10-CM | POA: Diagnosis present

## 2018-04-26 DIAGNOSIS — I5022 Chronic systolic (congestive) heart failure: Secondary | ICD-10-CM

## 2018-04-26 DIAGNOSIS — K5730 Diverticulosis of large intestine without perforation or abscess without bleeding: Secondary | ICD-10-CM | POA: Diagnosis not present

## 2018-04-26 DIAGNOSIS — R0989 Other specified symptoms and signs involving the circulatory and respiratory systems: Secondary | ICD-10-CM

## 2018-04-26 DIAGNOSIS — R0602 Shortness of breath: Secondary | ICD-10-CM | POA: Diagnosis not present

## 2018-04-26 DIAGNOSIS — Z87891 Personal history of nicotine dependence: Secondary | ICD-10-CM

## 2018-04-26 DIAGNOSIS — D509 Iron deficiency anemia, unspecified: Secondary | ICD-10-CM | POA: Diagnosis present

## 2018-04-26 DIAGNOSIS — E118 Type 2 diabetes mellitus with unspecified complications: Secondary | ICD-10-CM | POA: Diagnosis not present

## 2018-04-26 DIAGNOSIS — K5731 Diverticulosis of large intestine without perforation or abscess with bleeding: Principal | ICD-10-CM | POA: Diagnosis present

## 2018-04-26 DIAGNOSIS — D124 Benign neoplasm of descending colon: Secondary | ICD-10-CM | POA: Diagnosis not present

## 2018-04-26 DIAGNOSIS — E119 Type 2 diabetes mellitus without complications: Secondary | ICD-10-CM

## 2018-04-26 DIAGNOSIS — E1122 Type 2 diabetes mellitus with diabetic chronic kidney disease: Secondary | ICD-10-CM | POA: Diagnosis present

## 2018-04-26 DIAGNOSIS — Z7984 Long term (current) use of oral hypoglycemic drugs: Secondary | ICD-10-CM | POA: Diagnosis not present

## 2018-04-26 HISTORY — DX: Anemia, unspecified: D64.9

## 2018-04-26 LAB — CBC WITH DIFFERENTIAL/PLATELET
Abs Immature Granulocytes: 0.03 10*3/uL (ref 0.00–0.07)
BASOS ABS: 0.1 10*3/uL (ref 0.0–0.1)
Basophils Relative: 1 %
EOS ABS: 0.3 10*3/uL (ref 0.0–0.5)
EOS PCT: 4 %
HEMATOCRIT: 24.6 % — AB (ref 36.0–46.0)
HEMOGLOBIN: 7.4 g/dL — AB (ref 12.0–15.0)
Immature Granulocytes: 0 %
LYMPHS ABS: 1.8 10*3/uL (ref 0.7–4.0)
LYMPHS PCT: 20 %
MCH: 27.6 pg (ref 26.0–34.0)
MCHC: 30.1 g/dL (ref 30.0–36.0)
MCV: 91.8 fL (ref 80.0–100.0)
MONOS PCT: 8 %
Monocytes Absolute: 0.7 10*3/uL (ref 0.1–1.0)
Neutro Abs: 6.1 10*3/uL (ref 1.7–7.7)
Neutrophils Relative %: 67 %
Platelets: 305 10*3/uL (ref 150–400)
RBC: 2.68 MIL/uL — ABNORMAL LOW (ref 3.87–5.11)
RDW: 14.5 % (ref 11.5–15.5)
WBC: 9 10*3/uL (ref 4.0–10.5)
nRBC: 0 % (ref 0.0–0.2)

## 2018-04-26 LAB — COMPREHENSIVE METABOLIC PANEL
ALBUMIN: 3.4 g/dL — AB (ref 3.5–5.0)
ALK PHOS: 71 U/L (ref 38–126)
ALT: 15 U/L (ref 0–44)
ANION GAP: 12 (ref 5–15)
AST: 18 U/L (ref 15–41)
BILIRUBIN TOTAL: 1.2 mg/dL (ref 0.3–1.2)
BUN: 21 mg/dL (ref 8–23)
CALCIUM: 8.8 mg/dL — AB (ref 8.9–10.3)
CO2: 29 mmol/L (ref 22–32)
Chloride: 101 mmol/L (ref 98–111)
Creatinine, Ser: 1.4 mg/dL — ABNORMAL HIGH (ref 0.44–1.00)
GFR calc Af Amer: 43 mL/min — ABNORMAL LOW (ref >60)
GFR, EST NON AFRICAN AMERICAN: 37 mL/min — AB (ref >60)
GLUCOSE: 174 mg/dL — AB (ref 70–99)
POTASSIUM: 3.9 mmol/L (ref 3.5–5.1)
Sodium: 142 mmol/L (ref 135–145)
TOTAL PROTEIN: 6.5 g/dL (ref 6.5–8.1)

## 2018-04-26 LAB — CBC
HCT: 24.2 % — ABNORMAL LOW (ref 36.0–46.0)
Hemoglobin: 7.5 g/dL — ABNORMAL LOW (ref 12.0–15.0)
MCH: 27.9 pg (ref 26.0–34.0)
MCHC: 31 g/dL (ref 30.0–36.0)
MCV: 90 fL (ref 80.0–100.0)
Platelets: 299 10*3/uL (ref 150–400)
RBC: 2.69 MIL/uL — ABNORMAL LOW (ref 3.87–5.11)
RDW: 14.4 % (ref 11.5–15.5)
WBC: 8.2 10*3/uL (ref 4.0–10.5)
nRBC: 0 % (ref 0.0–0.2)

## 2018-04-26 LAB — GLUCOSE, CAPILLARY
Glucose-Capillary: 123 mg/dL — ABNORMAL HIGH (ref 70–99)
Glucose-Capillary: 166 mg/dL — ABNORMAL HIGH (ref 70–99)

## 2018-04-26 LAB — PREPARE RBC (CROSSMATCH)

## 2018-04-26 LAB — I-STAT TROPONIN, ED: Troponin i, poc: 0 ng/mL (ref 0.00–0.08)

## 2018-04-26 LAB — IRON AND TIBC
Iron: 26 ug/dL — ABNORMAL LOW (ref 28–170)
Saturation Ratios: 6 % — ABNORMAL LOW (ref 10.4–31.8)
TIBC: 435 ug/dL (ref 250–450)
UIBC: 409 ug/dL

## 2018-04-26 LAB — RETICULOCYTES
Immature Retic Fract: 26.1 % — ABNORMAL HIGH (ref 2.3–15.9)
RBC.: 2.75 MIL/uL — ABNORMAL LOW (ref 3.87–5.11)
Retic Count, Absolute: 56.4 10*3/uL (ref 19.0–186.0)
Retic Ct Pct: 2.1 % (ref 0.4–3.1)

## 2018-04-26 LAB — POC OCCULT BLOOD, ED: Fecal Occult Bld: NEGATIVE

## 2018-04-26 LAB — LIPASE, BLOOD: LIPASE: 25 U/L (ref 11–51)

## 2018-04-26 LAB — VITAMIN B12: Vitamin B-12: 294 pg/mL (ref 180–914)

## 2018-04-26 MED ORDER — NITROGLYCERIN 0.4 MG SL SUBL
0.4000 mg | SUBLINGUAL_TABLET | SUBLINGUAL | Status: DC | PRN
  Filled 2018-04-26: qty 1

## 2018-04-26 MED ORDER — FUROSEMIDE 10 MG/ML IJ SOLN
20.0000 mg | Freq: Once | INTRAMUSCULAR | Status: AC
  Administered 2018-04-26: 20 mg via INTRAVENOUS
  Filled 2018-04-26: qty 2

## 2018-04-26 MED ORDER — CLOPIDOGREL BISULFATE 75 MG PO TABS: 75.0000 mg | ORAL_TABLET | Freq: Every day | ORAL | Status: DC

## 2018-04-26 MED ORDER — SACUBITRIL-VALSARTAN 97-103 MG PO TABS
1.0000 | ORAL_TABLET | Freq: Two times a day (BID) | ORAL | Status: DC
  Administered 2018-04-26 – 2018-04-28 (×4): 1 via ORAL
  Filled 2018-04-26 (×6): qty 1

## 2018-04-26 MED ORDER — METFORMIN HCL ER 750 MG PO TB24
1500.0000 mg | ORAL_TABLET | Freq: Every day | ORAL | Status: DC
  Filled 2018-04-26 (×2): qty 2

## 2018-04-26 MED ORDER — PANTOPRAZOLE SODIUM 40 MG PO TBEC
40.0000 mg | DELAYED_RELEASE_TABLET | Freq: Every day | ORAL | Status: DC
  Administered 2018-04-27 – 2018-04-28 (×2): 40 mg via ORAL
  Filled 2018-04-26 (×2): qty 1

## 2018-04-26 MED ORDER — ONDANSETRON HCL 4 MG/2ML IJ SOLN: 4.0000 mg | Freq: Four times a day (QID) | INTRAMUSCULAR | Status: DC | PRN

## 2018-04-26 MED ORDER — ONDANSETRON HCL 4 MG PO TABS: 4.0000 mg | ORAL_TABLET | Freq: Four times a day (QID) | ORAL | Status: DC | PRN

## 2018-04-26 MED ORDER — CARVEDILOL 6.25 MG PO TABS
6.2500 mg | ORAL_TABLET | Freq: Two times a day (BID) | ORAL | Status: DC
  Administered 2018-04-26 – 2018-04-28 (×4): 6.25 mg via ORAL
  Filled 2018-04-26 (×4): qty 1

## 2018-04-26 MED ORDER — SODIUM CHLORIDE 0.9 % IV SOLN: 10.0000 mL/h | Freq: Once | INTRAVENOUS | Status: DC

## 2018-04-26 MED ORDER — FUROSEMIDE 20 MG PO TABS: 20.0000 mg | ORAL_TABLET | ORAL | Status: DC | PRN

## 2018-04-26 MED ORDER — BISACODYL 10 MG RE SUPP: 10.0000 mg | Freq: Every day | RECTAL | Status: DC | PRN

## 2018-04-26 MED ORDER — INSULIN ASPART 100 UNIT/ML ~~LOC~~ SOLN: 0.0000 [IU] | Freq: Every day | SUBCUTANEOUS | Status: DC

## 2018-04-26 MED ORDER — INSULIN ASPART 100 UNIT/ML ~~LOC~~ SOLN
0.0000 [IU] | Freq: Three times a day (TID) | SUBCUTANEOUS | Status: DC
  Administered 2018-04-26 – 2018-04-27 (×2): 2 [IU] via SUBCUTANEOUS
  Administered 2018-04-27: 3 [IU] via SUBCUTANEOUS

## 2018-04-26 MED ORDER — ACETAMINOPHEN 325 MG PO TABS: 325.0000 mg | ORAL_TABLET | Freq: Four times a day (QID) | ORAL | Status: DC | PRN

## 2018-04-26 MED ORDER — DOCUSATE SODIUM 100 MG PO CAPS: 100.0000 mg | ORAL_CAPSULE | Freq: Two times a day (BID) | ORAL | Status: DC | PRN

## 2018-04-26 MED ORDER — VITAMIN D3 25 MCG (1000 UNIT) PO TABS
2000.0000 [IU] | ORAL_TABLET | Freq: Every day | ORAL | Status: DC
  Administered 2018-04-27 – 2018-04-28 (×2): 2000 [IU] via ORAL
  Filled 2018-04-26 (×4): qty 2

## 2018-04-26 MED ORDER — ATORVASTATIN CALCIUM 80 MG PO TABS
80.0000 mg | ORAL_TABLET | Freq: Every day | ORAL | Status: DC
  Administered 2018-04-26 – 2018-04-27 (×2): 80 mg via ORAL
  Filled 2018-04-26 (×2): qty 1

## 2018-04-26 NOTE — ED Notes (Signed)
Consent for transfusion at bedside.

## 2018-04-26 NOTE — H&P (Signed)
History and Physical    Karen Cooper DOB: [birth year] DOA: 04/26/2018  PCP: Holland Commons, FNP Consultants:  none Patient coming from: home- lives with  Chief Complaint: Exertional dyspnea, low hemoglobin  HPI: Karen Cooper is a 73 y.o. female with medical history significant for CAD (status post PCI and stent to the LAD in October 2019), HLD, CKD3, DM2 who presented to the ED today due to abnormal labs.  In her primary care physician's office yesterday for routine follow-up at which time a CBC was done.  She was told to come to the ED when her hemoglobin returned at 7.4.  She was recently admitted to [facility] in the middle of November with shortness of breath, lightheadedness and found to have a hemoglobin of 6.9.  Endoscopy by Dr. Michail Sermon revealed a tiny gastric ulcer and mild gastritis.  She received 4 units of packed red blood cells during that admission and her Brilinta was discontinued and she was started instead on Plavix in addition to her aspirin.  She was instructed to start Protonix 40 mg daily which she reports she has been compliant with.  She states that neither last time nor recently has she looked at her stool and she is on sure if she has had any melena or hematochezia.  She has had no change in her bowel habits in terms of consistency and frequency.  She denies abdominal pain, nausea and vomiting.  She has had no fevers or chills.  She has had no chest pain.  She does report increased shortness of breath which is exertional that is been worsening over the past 6 weeks.  She also reports shortness of breath that is worse when lying flat on her back.  She has had increased bilateral lower extremity edema as well.  She is unsure if she is had any changes in her weight but states that her clothes do seem to be fitting tighter. She quit smoking one year ago.   ED Course: She was hemodynamically stable.  Rectal exam was performed and Hemoccult was negative.  1  unit of packed red blood cells was ordered.  Review of Systems: As per HPI; otherwise review of systems reviewed and negative.   Ambulatory Status:  Ambulates without assistance  Past Medical History:  Diagnosis Date  . Coronary artery disease   . Hypertension   . Type II diabetes mellitus ([place])     Past Surgical History:  Procedure Laterality Date  . BACK SURGERY    . CHOLECYSTECTOMY OPEN  1972  . CORONARY ANGIOPLASTY WITH STENT PLACEMENT  01/18/2018  . CORONARY STENT INTERVENTION N/A 01/18/2018   Procedure: CORONARY STENT INTERVENTION;  Surgeon: Troy Sine, MD;  Location: [place] [facility];  Service: Cardiovascular;  Laterality: N/A;  . ESOPHAGOGASTRODUODENOSCOPY  02/22/2018  . ESOPHAGOGASTRODUODENOSCOPY (EGD) WITH PROPOFOL N/A 02/22/2018   Procedure: ESOPHAGOGASTRODUODENOSCOPY (EGD) WITH PROPOFOL;  Surgeon: Wilford Corner, MD;  Location: [place];  Service: Endoscopy;  Laterality: N/A;  . INCISION AND DRAINAGE  2004   "infection had settled in my back"  . LEFT HEART CATH AND CORONARY ANGIOGRAPHY N/A 01/18/2018   Procedure: LEFT HEART CATH AND CORONARY ANGIOGRAPHY;  Surgeon: Troy Sine, MD;  Location: [place] [facility];  Service: Cardiovascular;  Laterality: N/A;  . [place]   ruptured disk  . TONSILLECTOMY      Social History   Socioeconomic History  . Marital status: Married    Spouse name: Not  on file  . Number of children: Not on file  . Years of education: Not on file  . Highest education level: Not on file  Occupational History  . Not on file  Social Needs  . Financial resource strain: Not on file  . Food insecurity:    Worry: Not on file    Inability: Not on file  . Transportation needs:    Medical: Not on file    Non-medical: Not on file  Tobacco Use  . Smoking status: Former Smoker    Packs/day: 1.00    Years: 40.00    Pack years: 40.00    Types: Cigarettes    Last attempt to quit: 04/03/2017    Years since  quitting: 1.0  . Smokeless tobacco: Never Used  Substance and Sexual Activity  . Alcohol use: Never    Frequency: Never  . Drug use: Never  . Sexual activity: Not Currently  Lifestyle  . Physical activity:    Days per week: Not on file    Minutes per session: Not on file  . Stress: Not on file  Relationships  . Social connections:    Talks on phone: Not on file    Gets together: Not on file    Attends religious service: Not on file    Active member of club or organization: Not on file    Attends meetings of clubs or organizations: Not on file    Relationship status: Not on file  . Intimate partner violence:    Fear of current or ex partner: Not on file    Emotionally abused: Not on file    Physically abused: Not on file    Forced sexual activity: Not on file  Other Topics Concern  . Not on file  Social History Narrative  . Not on file    No Known Allergies  Family History  Problem Relation Age of Onset  . Dementia Mother   . Heart failure Mother   . Heart disease Father   . Heart attack Brother   . Diabetes Brother   . Heart attack Brother   . Cancer Sister     Prior to Admission medications   Medication Sig Start Date End Date Taking? Authorizing Provider  acetaminophen (TYLENOL) 325 MG tablet Take 325-650 mg by mouth every 6 (six) hours as needed for mild pain or headache.    [provider]  aspirin 81 MG chewable tablet Chew 1 tablet (81 mg total) by mouth daily. 01/19/18   Kroeger, Lorelee Cover., PA-C  atorvastatin (LIPITOR) 80 MG tablet Take 1 tablet (80 mg total) by mouth daily at 6 PM. 01/19/18   Kroeger, Lorelee Cover., PA-C  carvedilol (COREG) 6.25 MG tablet Take 1 tablet (6.25 mg total) by mouth 2 (two) times daily. 01/19/18 01/19/19  Kroeger, Lorelee Cover., PA-C  Cholecalciferol (VITAMIN D3) 1000 units CAPS Take 2,000 Units by mouth daily.     [provider]  clopidogrel (PLAVIX) 75 MG tablet Take 1 tablet (75 mg total) by mouth daily. 02/24/18    Elgergawy, Silver Huguenin, MD  furosemide (LASIX) 20 MG tablet Take 1 tablet (20 mg total) by mouth as needed for edema. 02/21/18 02/21/19  Almyra Deforest, PA  metFORMIN (GLUCOPHAGE-XR) 750 MG 24 hr tablet Take 1,500 mg by mouth daily with supper.  12/05/17   [provider]  nitroGLYCERIN (NITROSTAT) 0.4 MG SL tablet Place 1 tablet (0.4 mg total) under the tongue every 5 (five) minutes as needed for chest pain.  01/19/18 01/19/19  Kroeger, Lorelee Cover., PA-C  pantoprazole (PROTONIX) 40 MG tablet Take 40 mg oral twice daily for 6 weeks, then continue 40 mg once daily after that 02/23/18   Elgergawy, Silver Huguenin, MD  sacubitril-valsartan (ENTRESTO) 97-103 MG Take 1 tablet by mouth 2 (two) times daily. 03/07/18   Almyra Deforest, PA    Physical Exam: Vitals:   04/26/18 1345 04/26/18 1400 04/26/18 1415 04/26/18 1430  BP: (!) 158/79 (!) 189/69 (!) 149/82 (!) 139/94  Pulse: (!) 57 61 (!) 58 65  Resp: 18 17 18  (!) 22  Temp:      TempSrc:      SpO2: 97% 99% 98% 98%     . General: Appears calm and comfortable and is in NAD. Pale appearing. . Eyes:  PERRL, EOMI, normal lids, iris . ENT:  grossly normal hearing, lips & tongue, mmm . Neck:  supple, no lymphadenopathy . Cardiovascular:  nL S1, S2, normal rate, reg rhythm, no murmur. Marland Kitchen Respiratory: Fine crackles left base, clear on the right.  Slightly diminished breath sounds in all fields.  Otherwise CTA bilaterally with no wheezes/rhonchi.  Normal respiratory effort. . Abdomen:  soft, NT, ND, NABS . Back:   grossly normal alignment . Skin:  no rash or lesions seen on limited exam . Musculoskeletal:  grossly normal tone BUE/BLE, good ROM, no bony abnormality or obvious joint deformity . Lower extremities:  Trace BLE edema.  Limited foot exam with no ulcerations.  2+ distal pulses. Marland Kitchen Psychiatric:  grossly normal mood and affect, speech fluent and appropriate, AOx3 . Neurologic:  CN 2-12 grossly intact, moves all extremities in coordinated fashion, sensation  intact, Patellar DTRs 2+ and symmetric    Radiological Exams on Admission: No results found.  EKG: Independently reviewed.   Date/Time:                  Wednesday April 26 2018 11:44:58 EST Ventricular Rate:         67 PR Interval:                   QRS Duration: 135 QT Interval:                 447 QTC Calculation:        472 R Axis:                         84 Text Interpretation:       Sinus rhythm Atrial premature complex Short PR interval Nonspecific intraventricular conduction delay Anteroseptal infarct, old Minimal ST depression, lateral leads Baseline wander in lead(s) II III aVF no sig change from previous   Labs on Admission: I have personally reviewed the available labs and imaging studies at the time of the admission.  Pertinent labs:  Hemoglobin 7.4, baseline 9-9.5 MCV 92 WBC, platelets within normal limits Creatinine 1.4, baseline Hemoccult negative  Assessment/Plan Principal Problem:   Symptomatic anemia Active Problems:   CAD (coronary artery disease), native coronary artery   Essential hypertension   Dyslipidemia   Ischemic cardiomyopathy   CKD (chronic kidney disease) stage 3, GFR 30-59 ml/min (HCC)   Diabetes type 2, controlled (HCC)   Chronic systolic CHF (congestive heart failure) (HCC)  Symptomatic anemia: possibly continued upper GIB from ulcer/gastritis but pt has been on appropriate therapy (PPI) and hemoccult here is negative. Potential lower GI bleed, pt has not had colonoscopy.  -transfuse one unit prbc and f/u CBC q12h -increase  protonix to BID -GI has been consulted, Dr. Collene Mares. Appreciate assistance -NPO after MN in case of procedure -iron panel, reticulocytes, B12 and folate prior to transfusion -hold ASA/plavix for now -monitor hemodynamics  CAD s/p stent to LAD Oct 2019 -hold ASA and plavix for now; will need to await results of endoscopic eval prior to restarting -cont carvedilol, lasix, Entresto  DM2 -hold oral  meds -SSI  HTN -cont carvedilol, lasix, entresto  HLD -cont atorvastatin  Chronic systolic CHF; possible mild acute component based on reported increased BLE edema, orthopnea. Also with crackles L base -lasix 20 mg IV after transfusion and prn -cont home lasix otherwise -cont Entresto, beta-blocker, statin -PA/lat CXR   DVT prophylaxis: SCDs Code Status:  Full - confirmed with patient/family Family Communication: none  Disposition Plan:  Home once clinically improved Consults called: GI  Admission status: Admit - It is my clinical opinion that admission to INPATIENT is reasonable and necessary because of the expectation that this patient will require hospital care that crosses at least 2 midnights to treat this condition based on the medical complexity of the problems presented.  Given the aforementioned information, the predictability of an adverse outcome is felt to be significant.     Janora Norlander MD [facility]  If note is complete, please contact covering daytime or nighttime physician. [email or web address] Password [facility]  04/26/2018, 3:02 PM

## 2018-04-26 NOTE — ED Triage Notes (Signed)
Pt here from home and was told by her MD office to come to the ED for abnormal Blood work ?

## 2018-04-26 NOTE — ED Provider Notes (Signed)
[place] EMERGENCY DEPARTMENT Provider Note   CSN: [id number] Arrival date & time: 04/26/18  1049     History   Chief Complaint No chief complaint on file.   HPI Karen Cooper is a 73 y.o. female.  The history is provided by the patient and medical records. No language interpreter was used.  Abnormal Lab     73 year old female with history of CAD, diabetes, hypertension came to the ED at the recommendation of PCP due to abnormal labs.  Patient states she was seen by her PCP for regular checkup yesterday.  Today she was noted to have abnormal labs and was recommended to come to ER for further care.  She did not know which labs were abnormal but states that in November she was admitted for anemia requiring blood transfusion.  She also report having an endoscopy done during her admission and was told that she has gastric ulcer which could be the source of her bleeding.  Currently her only complaint is exertional shortness of breath which has been ongoing for the past several months.  There is no associated fever or chills, no lightheadedness, dizziness, chest pain, nausea vomiting diarrhea, dysuria, black or tarry stool or any other abnormal bleeding.  Past Medical History:  Diagnosis Date  . Coronary artery disease   . Hypertension   . Type II diabetes mellitus [facility])     Patient Active Problem List   Diagnosis Date Noted  . Acute GI bleeding 02/21/2018  . Essential hypertension 01/19/2018  . Dyslipidemia 01/19/2018  . Tobacco abuse 01/19/2018  . Ischemic cardiomyopathy 01/19/2018  . CKD (chronic kidney disease) stage 3, GFR 30-59 ml/min (HCC) 01/19/2018  . Diabetes type 2, controlled ([place]) 01/19/2018  . CAD (coronary artery disease), native coronary artery 01/18/2018  . Abnormal nuclear stress test     Past Surgical History:  Procedure Laterality Date  . BACK SURGERY    . CHOLECYSTECTOMY OPEN  1972  . CORONARY ANGIOPLASTY WITH STENT PLACEMENT   01/18/2018  . CORONARY STENT INTERVENTION N/A 01/18/2018   Procedure: CORONARY STENT INTERVENTION;  Surgeon: Troy Sine, MD;  Location: [place] [facility];  Service: Cardiovascular;  Laterality: N/A;  . ESOPHAGOGASTRODUODENOSCOPY  02/22/2018  . ESOPHAGOGASTRODUODENOSCOPY (EGD) WITH PROPOFOL N/A 02/22/2018   Procedure: ESOPHAGOGASTRODUODENOSCOPY (EGD) WITH PROPOFOL;  Surgeon: Wilford Corner, MD;  Location: [place];  Service: Endoscopy;  Laterality: N/A;  . INCISION AND DRAINAGE  2004   "infection had settled in my back"  . LEFT HEART CATH AND CORONARY ANGIOGRAPHY N/A 01/18/2018   Procedure: LEFT HEART CATH AND CORONARY ANGIOGRAPHY;  Surgeon: Troy Sine, MD;  Location: [place] [facility];  Service: Cardiovascular;  Laterality: N/A;  . [place]   ruptured disk  . TONSILLECTOMY       OB History   No obstetric history on file.      Home Medications    Prior to Admission medications   Medication Sig Start Date End Date Taking? Authorizing Provider  acetaminophen (TYLENOL) 325 MG tablet Take 325-650 mg by mouth every 6 (six) hours as needed for mild pain or headache.    [provider]  aspirin 81 MG chewable tablet Chew 1 tablet (81 mg total) by mouth daily. 01/19/18   Kroeger, Lorelee Cover., PA-C  atorvastatin (LIPITOR) 80 MG tablet Take 1 tablet (80 mg total) by mouth daily at 6 PM. 01/19/18   Kroeger, Lorelee Cover., PA-C  carvedilol (COREG) 6.25 MG tablet Take  1 tablet (6.25 mg total) by mouth 2 (two) times daily. 01/19/18 01/19/19  Kroeger, Lorelee Cover., PA-C  Cholecalciferol (VITAMIN D3) 1000 units CAPS Take 2,000 Units by mouth daily.     [provider]  clopidogrel (PLAVIX) 75 MG tablet Take 1 tablet (75 mg total) by mouth daily. 02/24/18   Elgergawy, Silver Huguenin, MD  furosemide (LASIX) 20 MG tablet Take 1 tablet (20 mg total) by mouth as needed for edema. 02/21/18 02/21/19  Almyra Deforest, PA  metFORMIN (GLUCOPHAGE-XR) 750 MG 24 hr tablet Take 1,500  mg by mouth daily with supper.  12/05/17   [provider]  nitroGLYCERIN (NITROSTAT) 0.4 MG SL tablet Place 1 tablet (0.4 mg total) under the tongue every 5 (five) minutes as needed for chest pain. 01/19/18 01/19/19  Kroeger, Lorelee Cover., PA-C  pantoprazole (PROTONIX) 40 MG tablet Take 40 mg oral twice daily for 6 weeks, then continue 40 mg once daily after that 02/23/18   Elgergawy, Silver Huguenin, MD  sacubitril-valsartan (ENTRESTO) 97-103 MG Take 1 tablet by mouth 2 (two) times daily. 03/07/18   Almyra Deforest, PA    Family History Family History  Problem Relation Age of Onset  . Dementia Mother   . Heart failure Mother   . Heart disease Father   . Heart attack Brother   . Diabetes Brother   . Heart attack Brother   . Cancer Sister     Social History Social History   Tobacco Use  . Smoking status: Former Smoker    Packs/day: 1.00    Years: 40.00    Pack years: 40.00    Types: Cigarettes    Last attempt to quit: 04/03/2017    Years since quitting: 1.0  . Smokeless tobacco: Never Used  Substance Use Topics  . Alcohol use: Never    Frequency: Never  . Drug use: Never     Allergies   Patient has no known allergies.   Review of Systems Review of Systems  All other systems reviewed and are negative.    Physical Exam Updated Vital Signs BP (!) 155/70 (BP Location: Left Arm)   Pulse 78   Temp (!) 97.4 F (36.3 C) (Oral)   Resp 18   SpO2 95%   Physical Exam Vitals signs and nursing note reviewed.  Constitutional:      General: She is not in acute distress.    Appearance: She is well-developed.  HENT:     Head: Atraumatic.  Eyes:     Conjunctiva/sclera: Conjunctivae normal.  Neck:     Musculoskeletal: Neck supple.  Cardiovascular:     Rate and Rhythm: Normal rate and regular rhythm.     Pulses: Normal pulses.     Heart sounds: Normal heart sounds.  Pulmonary:     Effort: Pulmonary effort is normal.     Breath sounds: Normal breath sounds.  Abdominal:      Palpations: Abdomen is soft.     Tenderness: There is no abdominal tenderness.  Skin:    Coloration: Skin is pale.     Findings: No rash.  Neurological:     Mental Status: She is alert and oriented to person, place, and time.      ED Treatments / Results  Labs (all labs ordered are listed, but only abnormal results are displayed) Labs Reviewed  CBC WITH DIFFERENTIAL/PLATELET - Abnormal; Notable for the following components:      Result Value   RBC 2.68 (*)    Hemoglobin 7.4 (*)  HCT 24.6 (*)    All other components within normal limits  COMPREHENSIVE METABOLIC PANEL - Abnormal; Notable for the following components:   Glucose, Bld 174 (*)    Creatinine, Ser 1.40 (*)    Calcium 8.8 (*)    Albumin 3.4 (*)    GFR calc non Af Amer 37 (*)    GFR calc Af Amer 43 (*)    All other components within normal limits  LIPASE, BLOOD  URINALYSIS, ROUTINE W REFLEX MICROSCOPIC  I-STAT TROPONIN, ED  POC OCCULT BLOOD, ED  TYPE AND SCREEN  PREPARE RBC (CROSSMATCH)    EKG EKG Interpretation  Date/Time:  Wednesday April 26 2018 11:44:58 EST Ventricular Rate:  67 PR Interval:    QRS Duration: 135 QT Interval:  447 QTC Calculation: 472 R Axis:   84 Text Interpretation:  Sinus rhythm Atrial premature complex Short PR interval Nonspecific intraventricular conduction delay Anteroseptal infarct, old Minimal ST depression, lateral leads Baseline wander in lead(s) II III aVF no sig change from previous Confirmed by Charlesetta Shanks [phone]) on 04/26/2018 11:48:37 AM   Radiology No results found.  Procedures .Critical Care Performed by: Domenic Moras, PA-C Authorized by: Domenic Moras, PA-C   Critical care provider statement:    Critical care time (minutes):  45   Critical care was time spent personally by me on the following activities:  Discussions with consultants, evaluation of patient's response to treatment, examination of patient, ordering and performing treatments and interventions,  ordering and review of laboratory studies, ordering and review of radiographic studies, pulse oximetry, re-evaluation of patient's condition, obtaining history from patient or surrogate and review of old charts   (including critical care time)  Medications Ordered in ED Medications  0.9 %  sodium chloride infusion (has no administration in time range)     Initial Impression / Assessment and Plan / ED Course  I have reviewed the triage vital signs and the nursing notes.  Pertinent labs & imaging results that were available during my care of the patient were reviewed by me and considered in my medical decision making (see chart for details).     BP (!) 139/94   Pulse 65   Temp (!) 97.4 F (36.3 C) (Oral)   Resp (!) 22   SpO2 98%    Final Clinical Impressions(s) / ED Diagnoses   Final diagnoses:  Symptomatic anemia    ED Discharge Orders    None     12:01 PM She is sent here for abnormal labs, suspect low hemoglobin as it is similar to prior visit.  Work-up initiated.  2:41 PM Hemoglobin today is 7.4, fecal occult blood test is negative, normal troponin, normal EKG, mild AKI with creatinine of 1.4, normal lipase.  Patient will be receiving 1 unit of packed red blood cells as treatment of symptomatic anemia, will consult for admission Appreciate consultation from [facility] who agrees to see patient in the ED and will admit for further care.  Patient voiced understanding and agrees with plan.    Domenic Moras, PA-C 04/26/18 [place], [place], MD 04/27/18 [phone]

## 2018-04-26 NOTE — Consult Note (Addendum)
UNASSIGNED PATIENT Reason for Consult: Anemia.  Referring Physician: KHALEELAH YOWELL is an 73 y.o. female.  HPI: [name] is a 73 year old white female admitted to the right to the emergency room at [facility], when she was sent here by her PCP after she was noted to have severe anemia on routine labs done in the office. In the ER she was found to have a hemoglobin of 7.4 g/dL and was found to be heme negative testing for occult blood. For the last 6 weeks she's had some slight dizziness but denies any syncope or near syncopal episodes. There is no history of melena or hematochezia but the patient usually does not check the color of her stools. She denies having any abdominal pain, nausea, vomiting fever, chills or rigors. There is no history of chest pain or shortness of breath. She had similar symptoms in November 2019 when she was admitted for shortness of breath and lightheadedness and EGD was done by Dr. Lear Ng that revealed tiny gastric ulcer with mild gastritis she received 4 units of packed red blood cells at that time. She was switched from preventive that and was advised to take Plavix with aspirin as she has a history of coronary artery disease and is status post PCI and stent to the LAD in October 2019. She feels is more edema in her lower legs but denies any other GI problems at this time there is no history of frank reflux, dysphagia, odynophagia her weight has been relatively stable. She reports having a: Cologuard stool DNA test done about 2 years ago that was reportedly normal. This was apparently ordered by her PCP  Past Medical History:  Diagnosis Date  . Coronary artery disease   . Hypertension   . Type II diabetes mellitus (HCC)/Morbid obesity    Past Surgical History:  Procedure Laterality Date  . BACK SURGERY    . CHOLECYSTECTOMY OPEN  1972  . CORONARY ANGIOPLASTY WITH STENT PLACEMENT  01/18/2018  . CORONARY STENT INTERVENTION N/A 01/18/2018    Procedure: CORONARY STENT INTERVENTION;  Surgeon: Troy Sine, MD;  Location: [facility];  Service: Cardiovascular;  Laterality: N/A;  . ESOPHAGOGASTRODUODENOSCOPY  02/22/2018  . ESOPHAGOGASTRODUODENOSCOPY (EGD) WITH PROPOFOL N/A 02/22/2018   Procedure: ESOPHAGOGASTRODUODENOSCOPY (EGD) WITH PROPOFOL;  Surgeon: Wilford Corner, MD;  Location: [facility];  Service: Endoscopy;  Laterality: N/A;  . INCISION AND DRAINAGE  2004   "infection had settled in my back"  . LEFT HEART CATH AND CORONARY ANGIOGRAPHY N/A 01/18/2018   Procedure: LEFT HEART CATH AND CORONARY ANGIOGRAPHY;  Surgeon: Troy Sine, MD;  Location: [facility];  Service: Cardiovascular;  Laterality: N/A;  . [place]   ruptured disk  . TONSILLECTOMY     Family History  Problem Relation Age of Onset  . Dementia Mother   . Heart failure Mother   . Heart disease Father   . Heart attack Brother   . Diabetes Brother   . Heart attack Brother   . Cancer Sister     Social History:  reports that she quit smoking about 12 months ago. Her smoking use included cigarettes. She has a 40.00 pack-year smoking history. She has never used smokeless tobacco. She reports that she does not drink alcohol or use drugs.  Allergies: No Known Allergies  Medications: I have reviewed the patient's current medications.  Results for orders placed or performed during the hospital encounter of 04/26/18 (from the past  48 hour(s))  CBC with Differential     Status: Abnormal   Collection Time: 04/26/18 11:36 AM  Result Value Ref Range   WBC 9.0 4.0 - 10.5 K/uL   RBC 2.68 (L) 3.87 - 5.11 MIL/uL   Hemoglobin 7.4 (L) 12.0 - 15.0 g/dL   HCT 24.6 (L) 36.0 - 46.0 %   MCV 91.8 80.0 - 100.0 fL   MCH 27.6 26.0 - 34.0 pg   MCHC 30.1 30.0 - 36.0 g/dL   RDW 14.5 11.5 - 15.5 %   Platelets 305 150 - 400 K/uL   nRBC 0.0 0.0 - 0.2 %   Neutrophils Relative % 67 %   Neutro Abs 6.1 1.7 - 7.7 K/uL   Lymphocytes Relative 20 %    Lymphs Abs 1.8 0.7 - 4.0 K/uL   Monocytes Relative 8 %   Monocytes Absolute 0.7 0.1 - 1.0 K/uL   Eosinophils Relative 4 %   Eosinophils Absolute 0.3 0.0 - 0.5 K/uL   Basophils Relative 1 %   Basophils Absolute 0.1 0.0 - 0.1 K/uL   Immature Granulocytes 0 %   Abs Immature Granulocytes 0.03 0.00 - 0.07 K/uL    Comment: Performed at [facility] [address], [place], [place] [address]  Comprehensive metabolic panel     Status: Abnormal   Collection Time: 04/26/18 11:36 AM  Result Value Ref Range   Sodium 142 135 - 145 mmol/L   Potassium 3.9 3.5 - 5.1 mmol/L   Chloride 101 98 - 111 mmol/L   CO2 29 22 - 32 mmol/L   Glucose, Bld 174 (H) 70 - 99 mg/dL   BUN 21 8 - 23 mg/dL   Creatinine, Ser 1.40 (H) 0.44 - 1.00 mg/dL   Calcium 8.8 (L) 8.9 - 10.3 mg/dL   Total Protein 6.5 6.5 - 8.1 g/dL   Albumin 3.4 (L) 3.5 - 5.0 g/dL   AST 18 15 - 41 U/L   ALT 15 0 - 44 U/L   Alkaline Phosphatase 71 38 - 126 U/L   Total Bilirubin 1.2 0.3 - 1.2 mg/dL   GFR calc non Af Amer 37 (L) >60 mL/min   GFR calc Af Amer 43 (L) >60 mL/min   Anion gap 12 5 - 15    Comment: Performed at [facility] [address] [place], [place] [address]  Lipase, blood     Status: None   Collection Time: 04/26/18 11:36 AM  Result Value Ref Range   Lipase 25 11 - 51 U/L    Comment: Performed at [facility] [address], [place], [place] [address]  Type and screen     Status: None (Preliminary result)   Collection Time: 04/26/18 11:36 AM  Result Value Ref Range   ABO/RH(D) O POS    Antibody Screen NEG    Sample Expiration 04/29/2018    Unit Number [id number]    Blood Component Type RED CELLS,LR    Unit division 00    Status of Unit ALLOCATED    Transfusion Status OK TO TRANSFUSE    Crossmatch Result      Compatible Performed at [facility], [place] [address] [place], [place] [address]   Prepare RBC     Status: None   Collection Time: 04/26/18 11:36 AM  Result Value Ref Range    Order Confirmation      ORDER PROCESSED BY BLOOD BANK Performed at [facility], [place],  [place] [address]   I-stat troponin, ED     Status: None   Collection Time: 04/26/18 12:03 PM  Result Value Ref Range   Troponin i, poc 0.00 0.00 - 0.08 ng/mL   Comment 3            Comment: Due to the release kinetics of cTnI, a negative result within the first hours of the onset of symptoms does not rule out myocardial infarction with certainty. If myocardial infarction is still suspected, repeat the test at appropriate intervals.   POC occult blood, ED RN will collect     Status: None   Collection Time: 04/26/18  1:25 PM  Result Value Ref Range   Fecal Occult Bld NEGATIVE NEGATIVE   No results found.  Review of Systems  Constitutional: Positive for malaise/fatigue. Negative for chills, diaphoresis, fever and weight loss.  HENT: Negative.   Eyes: Negative.   Respiratory: Negative for cough, hemoptysis, sputum production, shortness of breath and wheezing.   Cardiovascular: Negative.   Musculoskeletal: Positive for back pain and joint pain.  Skin: Negative.   Neurological: Negative.   Endo/Heme/Allergies: Negative.   Psychiatric/Behavioral: Negative.    Blood pressure (!) 168/67, pulse 68, temperature (!) 97.4 F (36.3 C), temperature source Oral, resp. rate (!) 23, SpO2 96 %. Physical Exam  Constitutional: She is oriented to person, place, and time. She appears well-developed and well-nourished.  Morbidly obese  HENT:  Head: Normocephalic and atraumatic.  Eyes: Pupils are equal, round, and reactive to light. Conjunctivae and EOM are normal.  Neck: Normal range of motion. Neck supple.  Cardiovascular: Regular rhythm.  Respiratory: Effort normal and breath sounds normal.  GI: Soft. Bowel sounds are normal. She exhibits no distension. There is no abdominal tenderness. There is no rebound and no guarding.  Neurological: She is alert and oriented to person, place,  and time.  Skin: Skin is warm.  Psychiatric: She has a normal mood and affect. Her behavior is normal. Judgment and thought content normal.   Assessment/Plan: 1) Symptomatic anemia-reasons unclear I think may need to re-guaiac her stools over the next couple of days and make further plans as needed. Continue serial CBC's and transfuse as needed.  2) CAD/HTN/Hyperlipidemia/ischemic cardiomyopathy/chronic systolic congestive heart failure. 3) Chronic kidney disease stage III. 4) Type 2 diabetes mellitus/Morbid obesity. Marland KitchenMANN,Karen Frances 04/26/2018, 5:13 PM

## 2018-04-26 NOTE — ED Notes (Signed)
Patient transported to X-ray 

## 2018-04-27 ENCOUNTER — Encounter (HOSPITAL_COMMUNITY): Payer: Self-pay | Admitting: General Practice

## 2018-04-27 DIAGNOSIS — I2510 Atherosclerotic heart disease of native coronary artery without angina pectoris: Secondary | ICD-10-CM

## 2018-04-27 DIAGNOSIS — N183 Chronic kidney disease, stage 3 (moderate): Secondary | ICD-10-CM

## 2018-04-27 DIAGNOSIS — I5022 Chronic systolic (congestive) heart failure: Secondary | ICD-10-CM

## 2018-04-27 LAB — URINALYSIS, ROUTINE W REFLEX MICROSCOPIC
Bilirubin Urine: NEGATIVE
Glucose, UA: NEGATIVE mg/dL
Hgb urine dipstick: NEGATIVE
Ketones, ur: NEGATIVE mg/dL
Nitrite: POSITIVE — AB
Protein, ur: 30 mg/dL — AB
Specific Gravity, Urine: 1.017 (ref 1.005–1.030)
pH: 6 (ref 5.0–8.0)

## 2018-04-27 LAB — CBC
HCT: 23.4 % — ABNORMAL LOW (ref 36.0–46.0)
Hemoglobin: 7.2 g/dL — ABNORMAL LOW (ref 12.0–15.0)
MCH: 27.5 pg (ref 26.0–34.0)
MCHC: 30.8 g/dL (ref 30.0–36.0)
MCV: 89.3 fL (ref 80.0–100.0)
Platelets: 286 10*3/uL (ref 150–400)
RBC: 2.62 MIL/uL — ABNORMAL LOW (ref 3.87–5.11)
RDW: 14.4 % (ref 11.5–15.5)
WBC: 8.7 10*3/uL (ref 4.0–10.5)
nRBC: 0 % (ref 0.0–0.2)

## 2018-04-27 LAB — BASIC METABOLIC PANEL
Anion gap: 11 (ref 5–15)
BUN: 20 mg/dL (ref 8–23)
CO2: 29 mmol/L (ref 22–32)
Calcium: 8.8 mg/dL — ABNORMAL LOW (ref 8.9–10.3)
Chloride: 102 mmol/L (ref 98–111)
Creatinine, Ser: 1.44 mg/dL — ABNORMAL HIGH (ref 0.44–1.00)
GFR calc Af Amer: 41 mL/min — ABNORMAL LOW (ref >60)
GFR calc non Af Amer: 36 mL/min — ABNORMAL LOW (ref >60)
Glucose, Bld: 138 mg/dL — ABNORMAL HIGH (ref 70–99)
Potassium: 3.8 mmol/L (ref 3.5–5.1)
Sodium: 142 mmol/L (ref 135–145)

## 2018-04-27 LAB — FOLATE RBC
Folate, Hemolysate: 316 ng/mL
Folate, RBC: 1356 ng/mL (ref >498)
Hematocrit: 23.3 % — ABNORMAL LOW (ref 34.0–46.6)

## 2018-04-27 LAB — GLUCOSE, CAPILLARY
Glucose-Capillary: 126 mg/dL — ABNORMAL HIGH (ref 70–99)
Glucose-Capillary: 170 mg/dL — ABNORMAL HIGH (ref 70–99)
Glucose-Capillary: 75 mg/dL (ref 70–99)
Glucose-Capillary: 121 mg/dL — ABNORMAL HIGH (ref 70–99)

## 2018-04-27 LAB — HEMOGLOBIN AND HEMATOCRIT, BLOOD
HCT: 30.5 % — ABNORMAL LOW (ref 36.0–46.0)
Hemoglobin: 9.7 g/dL — ABNORMAL LOW (ref 12.0–15.0)

## 2018-04-27 LAB — PREPARE RBC (CROSSMATCH)

## 2018-04-27 LAB — FERRITIN: Ferritin: 41 ng/mL (ref 11–307)

## 2018-04-27 MED ORDER — PEG 3350-KCL-NA BICARB-NACL 420 G PO SOLR
4000.0000 mL | Freq: Once | ORAL | Status: AC
  Administered 2018-04-27: 4000 mL via ORAL
  Filled 2018-04-27: qty 4000

## 2018-04-27 MED ORDER — CLOPIDOGREL BISULFATE 75 MG PO TABS
75.0000 mg | ORAL_TABLET | Freq: Every day | ORAL | Status: DC
  Administered 2018-04-27 – 2018-04-28 (×2): 75 mg via ORAL
  Filled 2018-04-27 (×2): qty 1

## 2018-04-27 MED ORDER — DIPHENHYDRAMINE HCL 50 MG/ML IJ SOLN
25.0000 mg | Freq: Once | INTRAMUSCULAR | Status: AC
  Administered 2018-04-27: 25 mg via INTRAVENOUS
  Filled 2018-04-27: qty 1

## 2018-04-27 MED ORDER — SODIUM CHLORIDE 0.9% IV SOLUTION
Freq: Once | INTRAVENOUS | Status: AC
  Administered 2018-04-27: 14:00:00 via INTRAVENOUS

## 2018-04-27 MED ORDER — ACETAMINOPHEN 325 MG PO TABS
650.0000 mg | ORAL_TABLET | Freq: Once | ORAL | Status: AC
  Administered 2018-04-27: 650 mg via ORAL
  Filled 2018-04-27: qty 2

## 2018-04-27 MED ORDER — FUROSEMIDE 20 MG PO TABS
20.0000 mg | ORAL_TABLET | Freq: Every day | ORAL | Status: DC
  Administered 2018-04-27 – 2018-04-28 (×2): 20 mg via ORAL
  Filled 2018-04-27 (×2): qty 1

## 2018-04-27 MED ORDER — ASPIRIN 81 MG PO CHEW
81.0000 mg | CHEWABLE_TABLET | Freq: Every day | ORAL | Status: DC
  Administered 2018-04-27 – 2018-04-28 (×2): 81 mg via ORAL
  Filled 2018-04-27 (×2): qty 1

## 2018-04-27 NOTE — Progress Notes (Signed)
PROGRESS NOTE        PATIENT DETAILS Name: Karen Cooper Age: 73 y.o. Sex: female Date of Birth: 1945/08/28 Admit Date: 04/26/2018 Admitting Physician Janora Norlander, MD [id number], Leonia Reader, FNP  Brief Narrative: Patient is a 73 y.o. female with history of CAD status post PCI in October 3976, chronic systolic heart failure (EF 35-40% by TTE on 01/10/2018) presented to the hospital with exertional dyspnea, found to have a hemoglobin of 7.4.  See below for further details  Subjective: No shortness of breath at rest-but continues to have exertional dyspnea.  Assessment/Plan: Symptomatic anemia: Etiology remains obscure-not sure if patient had a recent GI bleed that she was just not aware of.  On November 2019-she had a similar presentation-and was found to have gastric ulcers on EGD.  Transfuse 1 unit PRBC (never received on 1/15) and follow CBC.  She has never had a colonoscopy in the past-GI following with plans for colonoscopy tomorrow.  Check ferritin level.  Exertional dyspnea: No signs of volume overload-even though she has a history of systolic heart failure-suspect secondary to above-we will need to keep hemoglobin above 8 given history of CAD.  History of CAD status post PCI to LAD on October 2019: No overt GI bleeding evident at this time-in fact FOBT is negative-we will restart aspirin/Plavix (discussed with GI MD-Dr. Benson Norway)  Chronic systolic heart failure (EF 35-40% by TTE on 01/10/2018): Volume status is stable-continue Lasix, Coreg and Entresto.  DM-2: CBG stable continue SSI.  Continue to hold all oral hypoglycemic agents.  Dyslipidemia: Continue statin  CKD stage III: Creatinine close to usual baseline.  DVT Prophylaxis: SCD's  Code Status: Full code   Family Communication: None at bedside  Disposition Plan: Remain inpatient  Antimicrobial agents: Anti-infectives (From admission, onward)   None       Procedures: None  CONSULTS:  GI  Time spent: 25- minutes-Greater than 50% of this time was spent in counseling, explanation of diagnosis, planning of further management, and coordination of care.  MEDICATIONS: Scheduled Meds: . sodium chloride   Intravenous Once  . acetaminophen  650 mg Oral Once  . atorvastatin  80 mg Oral q1800  . carvedilol  6.25 mg Oral BID  . cholecalciferol  2,000 Units Oral Daily  . diphenhydrAMINE  25 mg Intravenous Once  . insulin aspart  0-15 Units Subcutaneous TID WC  . insulin aspart  0-5 Units Subcutaneous QHS  . metFORMIN  1,500 mg Oral Q supper  . pantoprazole  40 mg Oral Daily  . polyethylene glycol-electrolytes  4,000 mL Oral Once  . sacubitril-valsartan  1 tablet Oral BID   Continuous Infusions: . sodium chloride     PRN Meds:.acetaminophen, bisacodyl, docusate sodium, furosemide, nitroGLYCERIN, ondansetron **OR** ondansetron (ZOFRAN) IV   PHYSICAL EXAM: Vital signs: Vitals:   04/26/18 1734 04/26/18 2000 04/26/18 2158 04/27/18 0606  BP: (!) 154/85 (!) 127/93 (!) 145/55 (!) 137/57  Pulse: 77  64 80  Resp: 20 18 20 18   Temp: (!) 97.5 F (36.4 C)  97.7 F (36.5 C) 97.8 F (36.6 C)  TempSrc: Oral Oral Oral Oral  SpO2: 99% 99% 95% 94%   There were no vitals filed for this visit. There is no height or weight on file to calculate BMI.   General appearance :Awake, alert, not in any distress.  HEENT: Atraumatic and Normocephalic Neck:  supple Resp:Good air entry bilaterally, no added sounds  CVS: S1 S2 regular  GI: Bowel sounds present, Non tender and not distended with no gaurding, rigidity or rebound.No organomegaly Extremities: B/L Lower Ext shows no edema, both legs are warm to touch Neurology:  speech clear,Non focal, sensation is grossly intact. Psychiatric: Normal judgment and insight. Alert and oriented x 3. Normal mood. Musculoskeletal:No digital cyanosis Skin:No Rash, warm and dry Wounds:N/A  I have personally  reviewed following labs and imaging studies  LABORATORY DATA: CBC: Recent Labs  Lab 04/26/18 1136 04/26/18 1819 04/27/18 0638  WBC 9.0 8.2 8.7  NEUTROABS 6.1  --   --   HGB 7.4* 7.5* 7.2*  HCT 24.6* 24.2* 23.4*  MCV 91.8 90.0 89.3  PLT 305 299 465    Basic Metabolic Panel: Recent Labs  Lab 04/26/18 1136 04/27/18 0638  NA 142 142  K 3.9 3.8  CL 101 102  CO2 29 29  GLUCOSE 174* 138*  BUN 21 20  CREATININE 1.40* 1.44*  CALCIUM 8.8* 8.8*    GFR: CrCl cannot be calculated (Unknown ideal weight.).  Liver Function Tests: Recent Labs  Lab 04/26/18 1136  AST 18  ALT 15  ALKPHOS 71  BILITOT 1.2  PROT 6.5  ALBUMIN 3.4*   Recent Labs  Lab 04/26/18 1136  LIPASE 25   No results for input(s): AMMONIA in the last 168 hours.  Coagulation Profile: No results for input(s): INR, PROTIME in the last 168 hours.  Cardiac Enzymes: No results for input(s): CKTOTAL, CKMB, CKMBINDEX, TROPONINI in the last 168 hours.  BNP (last 3 results) No results for input(s): PROBNP in the last 8760 hours.  HbA1C: No results for input(s): HGBA1C in the last 72 hours.  CBG: Recent Labs  Lab 04/26/18 1727 04/26/18 2156 04/27/18 0806  GLUCAP 123* 166* 126*    Lipid Profile: No results for input(s): CHOL, HDL, LDLCALC, TRIG, CHOLHDL, LDLDIRECT in the last 72 hours.  Thyroid Function Tests: No results for input(s): TSH, T4TOTAL, FREET4, T3FREE, THYROIDAB in the last 72 hours.  Anemia Panel: Recent Labs    04/26/18 1819  VITAMINB12 294  TIBC 435  IRON 26*  RETICCTPCT 2.1    Urine analysis: No results found for: COLORURINE, APPEARANCEUR, LABSPEC, PHURINE, GLUCOSEU, HGBUR, BILIRUBINUR, KETONESUR, PROTEINUR, UROBILINOGEN, NITRITE, LEUKOCYTESUR  Sepsis Labs: Lactic Acid, Venous No results found for: LATICACIDVEN  MICROBIOLOGY: No results found for this or any previous visit (from the past 240 hour(s)).  RADIOLOGY STUDIES/RESULTS: Dg Chest 2 View  Result Date:  04/26/2018 CLINICAL DATA:  73 year old female with shortness of breath on exertion EXAM: CHEST - 2 VIEW COMPARISON:  Prior chest x-ray 03/20/2015 FINDINGS: Cardiac and mediastinal contours are within normal limits. Atherosclerotic calcifications are present in the transverse aorta. Stable background bronchitic changes and mild interstitial prominence. There is blunting of the cardio phrenic angles on lateral view suggesting either small bilateral pleural effusions or bibasilar atelectasis. Otherwise, no focal airspace opacity. No acute osseous abnormality. IMPRESSION: 1. Trace bilateral pleural effusions versus lower lobe atelectasis. 2. Otherwise, stable chest x-ray without evidence of acute cardiopulmonary process. Electronically Signed   By: Jacqulynn Cadet M.D.   On: 04/26/2018 17:11     LOS: 1 day   Oren Binet, MD  [facility]  If 7PM-7AM, please contact night-coverage  Please page via [email or web address]-Password [email or web address]-click on MD name and type text message  04/27/2018, 11:01 AM

## 2018-04-27 NOTE — H&P (View-Only) (Signed)
Subjective: No complaints.  Objective: Vital signs in last 24 hours: Temp:  [97.4 F (36.3 C)-97.8 F (36.6 C)] 97.8 F (36.6 C) (01/16 0606) Pulse Rate:  [56-80] 80 (01/16 0606) Resp:  [16-23] 18 (01/16 0606) BP: (127-189)/(54-95) 137/57 (01/16 0606) SpO2:  [94 %-99 %] 94 % (01/16 0606) Last BM Date: (PTA)  Intake/Output from previous day: 01/15 0701 - 01/16 0700 In: 120 [P.O.:120] Out: -  Intake/Output this shift: No intake/output data recorded.  General appearance: alert and no distress GI: soft, non-tender; bowel sounds normal; no masses,  no organomegaly  Lab Results: Recent Labs    04/26/18 1136 04/26/18 1819  WBC 9.0 8.2  HGB 7.4* 7.5*  HCT 24.6* 24.2*  PLT 305 299   BMET Recent Labs    04/26/18 1136  NA 142  K 3.9  CL 101  CO2 29  GLUCOSE 174*  BUN 21  CREATININE 1.40*  CALCIUM 8.8*   LFT Recent Labs    04/26/18 1136  PROT 6.5  ALBUMIN 3.4*  AST 18  ALT 15  ALKPHOS 71  BILITOT 1.2   PT/INR No results for input(s): LABPROT, INR in the last 72 hours. Hepatitis Panel No results for input(s): HEPBSAG, HCVAB, HEPAIGM, HEPBIGM in the last 72 hours. C-Diff No results for input(s): CDIFFTOX in the last 72 hours. Fecal Lactopherrin No results for input(s): FECLLACTOFRN in the last 72 hours.  Studies/Results: Dg Chest 2 View  Result Date: 04/26/2018 CLINICAL DATA:  73 year old female with shortness of breath on exertion EXAM: CHEST - 2 VIEW COMPARISON:  Prior chest x-ray 03/20/2015 FINDINGS: Cardiac and mediastinal contours are within normal limits. Atherosclerotic calcifications are present in the transverse aorta. Stable background bronchitic changes and mild interstitial prominence. There is blunting of the cardio phrenic angles on lateral view suggesting either small bilateral pleural effusions or bibasilar atelectasis. Otherwise, no focal airspace opacity. No acute osseous abnormality. IMPRESSION: 1. Trace bilateral pleural effusions versus  lower lobe atelectasis. 2. Otherwise, stable chest x-ray without evidence of acute cardiopulmonary process. Electronically Signed   By: Jacqulynn Cadet M.D.   On: 04/26/2018 17:11    Medications:  Scheduled: . atorvastatin  80 mg Oral q1800  . carvedilol  6.25 mg Oral BID  . cholecalciferol  2,000 Units Oral Daily  . insulin aspart  0-15 Units Subcutaneous TID WC  . insulin aspart  0-5 Units Subcutaneous QHS  . metFORMIN  1,500 mg Oral Q supper  . pantoprazole  40 mg Oral Daily  . sacubitril-valsartan  1 tablet Oral BID   Continuous: . sodium chloride      Assessment/Plan: 1) Anemia. 2) Probable COPD. 3) CAD s/p stenting.   The patient purses her lips with breathing, which is consistent with COPD.  However, formal testing was never performed and she denies being on oxygen at home.  She has a long history of smoking and she quit over 1 year ago.  She is s/p stenting for an abnormal nuclear stress test 01/2018.  She has a persistent anemia and it is recommended to undergo a colonoscopy.  She tolerated the EGD last year and it was performed without any respiratory difficulty.  Plan: 1) Colonoscopy tomorrow.  LOS: 1 day   Jessi Jessop D 04/27/2018, 7:05 AM

## 2018-04-27 NOTE — Progress Notes (Signed)
Subjective: No complaints.  Objective: Vital signs in last 24 hours: Temp:  [97.4 F (36.3 C)-97.8 F (36.6 C)] 97.8 F (36.6 C) (01/16 0606) Pulse Rate:  [56-80] 80 (01/16 0606) Resp:  [16-23] 18 (01/16 0606) BP: (127-189)/(54-95) 137/57 (01/16 0606) SpO2:  [94 %-99 %] 94 % (01/16 0606) Last BM Date: (PTA)  Intake/Output from previous day: 01/15 0701 - 01/16 0700 In: 120 [P.O.:120] Out: -  Intake/Output this shift: No intake/output data recorded.  General appearance: alert and no distress GI: soft, non-tender; bowel sounds normal; no masses,  no organomegaly  Lab Results: Recent Labs    04/26/18 1136 04/26/18 1819  WBC 9.0 8.2  HGB 7.4* 7.5*  HCT 24.6* 24.2*  PLT 305 299   BMET Recent Labs    04/26/18 1136  NA 142  K 3.9  CL 101  CO2 29  GLUCOSE 174*  BUN 21  CREATININE 1.40*  CALCIUM 8.8*   LFT Recent Labs    04/26/18 1136  PROT 6.5  ALBUMIN 3.4*  AST 18  ALT 15  ALKPHOS 71  BILITOT 1.2   PT/INR No results for input(s): LABPROT, INR in the last 72 hours. Hepatitis Panel No results for input(s): HEPBSAG, HCVAB, HEPAIGM, HEPBIGM in the last 72 hours. C-Diff No results for input(s): CDIFFTOX in the last 72 hours. Fecal Lactopherrin No results for input(s): FECLLACTOFRN in the last 72 hours.  Studies/Results: Dg Chest 2 View  Result Date: 04/26/2018 CLINICAL DATA:  73 year old female with shortness of breath on exertion EXAM: CHEST - 2 VIEW COMPARISON:  Prior chest x-ray 03/20/2015 FINDINGS: Cardiac and mediastinal contours are within normal limits. Atherosclerotic calcifications are present in the transverse aorta. Stable background bronchitic changes and mild interstitial prominence. There is blunting of the cardio phrenic angles on lateral view suggesting either small bilateral pleural effusions or bibasilar atelectasis. Otherwise, no focal airspace opacity. No acute osseous abnormality. IMPRESSION: 1. Trace bilateral pleural effusions versus  lower lobe atelectasis. 2. Otherwise, stable chest x-ray without evidence of acute cardiopulmonary process. Electronically Signed   By: Jacqulynn Cadet M.D.   On: 04/26/2018 17:11    Medications:  Scheduled: . atorvastatin  80 mg Oral q1800  . carvedilol  6.25 mg Oral BID  . cholecalciferol  2,000 Units Oral Daily  . insulin aspart  0-15 Units Subcutaneous TID WC  . insulin aspart  0-5 Units Subcutaneous QHS  . metFORMIN  1,500 mg Oral Q supper  . pantoprazole  40 mg Oral Daily  . sacubitril-valsartan  1 tablet Oral BID   Continuous: . sodium chloride      Assessment/Plan: 1) Anemia. 2) Probable COPD. 3) CAD s/p stenting.   The patient purses her lips with breathing, which is consistent with COPD.  However, formal testing was never performed and she denies being on oxygen at home.  She has a long history of smoking and she quit over 1 year ago.  She is s/p stenting for an abnormal nuclear stress test 01/2018.  She has a persistent anemia and it is recommended to undergo a colonoscopy.  She tolerated the EGD last year and it was performed without any respiratory difficulty.  Plan: 1) Colonoscopy tomorrow.  LOS: 1 day   Karen Cooper D 04/27/2018, 7:05 AM

## 2018-04-28 ENCOUNTER — Encounter (HOSPITAL_COMMUNITY): Payer: Self-pay | Admitting: Certified Registered Nurse Anesthetist

## 2018-04-28 ENCOUNTER — Inpatient Hospital Stay (HOSPITAL_COMMUNITY): Payer: Medicare Other | Admitting: Certified Registered Nurse Anesthetist

## 2018-04-28 ENCOUNTER — Encounter (HOSPITAL_COMMUNITY)
Admission: EM | Disposition: A | Discharge status: Home / Self Care | Payer: Self-pay | Source: Home / Self Care | Attending: Internal Medicine

## 2018-04-28 DIAGNOSIS — E118 Type 2 diabetes mellitus with unspecified complications: Secondary | ICD-10-CM

## 2018-04-28 HISTORY — PX: COLONOSCOPY WITH PROPOFOL: SHX5780

## 2018-04-28 HISTORY — PX: POLYPECTOMY: SHX5525

## 2018-04-28 LAB — CBC
HCT: 26.5 % — ABNORMAL LOW (ref 36.0–46.0)
Hemoglobin: 8.4 g/dL — ABNORMAL LOW (ref 12.0–15.0)
MCH: 28.1 pg (ref 26.0–34.0)
MCHC: 31.7 g/dL (ref 30.0–36.0)
MCV: 88.6 fL (ref 80.0–100.0)
Platelets: 256 10*3/uL (ref 150–400)
RBC: 2.99 MIL/uL — ABNORMAL LOW (ref 3.87–5.11)
RDW: 15 % (ref 11.5–15.5)
WBC: 7.7 10*3/uL (ref 4.0–10.5)
nRBC: 0 % (ref 0.0–0.2)

## 2018-04-28 LAB — BASIC METABOLIC PANEL
Anion gap: 7 (ref 5–15)
BUN: 17 mg/dL (ref 8–23)
CO2: 34 mmol/L — ABNORMAL HIGH (ref 22–32)
CREATININE: 1.35 mg/dL — AB (ref 0.44–1.00)
Calcium: 8.6 mg/dL — ABNORMAL LOW (ref 8.9–10.3)
Chloride: 103 mmol/L (ref 98–111)
GFR calc Af Amer: 45 mL/min — ABNORMAL LOW (ref >60)
GFR calc non Af Amer: 39 mL/min — ABNORMAL LOW (ref >60)
Glucose, Bld: 100 mg/dL — ABNORMAL HIGH (ref 70–99)
Potassium: 3.6 mmol/L (ref 3.5–5.1)
Sodium: 144 mmol/L (ref 135–145)

## 2018-04-28 LAB — TYPE AND SCREEN
ABO/RH(D): O POS
Antibody Screen: NEGATIVE
Unit division: 0

## 2018-04-28 LAB — BPAM RBC
Blood Product Expiration Date: 202002132359
ISSUE DATE / TIME: 202001161342
Unit Type and Rh: 5100

## 2018-04-28 LAB — GLUCOSE, CAPILLARY
Glucose-Capillary: 98 mg/dL (ref 70–99)
Glucose-Capillary: 112 mg/dL — ABNORMAL HIGH (ref 70–99)
Glucose-Capillary: 155 mg/dL — ABNORMAL HIGH (ref 70–99)

## 2018-04-28 SURGERY — COLONOSCOPY WITH PROPOFOL
Anesthesia: Monitor Anesthesia Care

## 2018-04-28 MED ORDER — LACTATED RINGERS IV SOLN
INTRAVENOUS | Status: DC
  Administered 2018-04-28: 14:00:00 via INTRAVENOUS

## 2018-04-28 MED ORDER — PROPOFOL 10 MG/ML IV BOLUS
INTRAVENOUS | Status: DC | PRN
  Administered 2018-04-28 (×2): 10 mg via INTRAVENOUS
  Administered 2018-04-28: 20 mg via INTRAVENOUS
  Administered 2018-04-28: 10 mg via INTRAVENOUS

## 2018-04-28 MED ORDER — PANTOPRAZOLE SODIUM 40 MG PO TBEC: 40.0000 mg | DELAYED_RELEASE_TABLET | Freq: Every day | ORAL | 0 refills | Status: DC

## 2018-04-28 MED ORDER — FERROUS SULFATE 325 (65 FE) MG PO TABS: 325.0000 mg | ORAL_TABLET | Freq: Two times a day (BID) | ORAL | 0 refills | Status: AC

## 2018-04-28 MED ORDER — SODIUM CHLORIDE 0.9 % IV SOLN: INTRAVENOUS | Status: DC

## 2018-04-28 MED ORDER — PROPOFOL 500 MG/50ML IV EMUL
INTRAVENOUS | Status: DC | PRN
  Administered 2018-04-28: 75 ug/kg/min via INTRAVENOUS

## 2018-04-28 MED ORDER — SODIUM CHLORIDE 0.9 % IV SOLN
510.0000 mg | Freq: Once | INTRAVENOUS | Status: AC
  Administered 2018-04-28: 510 mg via INTRAVENOUS
  Filled 2018-04-28: qty 17

## 2018-04-28 SURGICAL SUPPLY — 22 items

## 2018-04-28 NOTE — Anesthesia Postprocedure Evaluation (Signed)
Anesthesia Post Note  Patient: Karen Cooper  Procedure(s) Performed: COLONOSCOPY WITH PROPOFOL (N/A ) POLYPECTOMY     Patient location during evaluation: Endoscopy Anesthesia Type: MAC Level of consciousness: awake and alert Pain management: pain level controlled Vital Signs Assessment: post-procedure vital signs reviewed and stable Respiratory status: spontaneous breathing, nonlabored ventilation and respiratory function stable Cardiovascular status: stable and blood pressure returned to baseline Postop Assessment: no apparent nausea or vomiting Anesthetic complications: no    Last Vitals:  Vitals:   04/28/18 1439 04/28/18 1440  BP:  (!) 126/47  Pulse:  72  Resp: 14 17  Temp:  36.4 C  SpO2:  100%    Last Pain:  Vitals:   04/28/18 1440  TempSrc: Oral  PainSc: 0-No pain                 Shalice Woodring,W. EDMOND

## 2018-04-28 NOTE — Progress Notes (Signed)
PROGRESS NOTE        PATIENT DETAILS Name: Karen Cooper Age: 73 y.o. Sex: female Date of Birth: 07-24-1945 Admit Date: 04/26/2018 Admitting Physician Janora Norlander, MD QBH:[id number], Leonia Reader, FNP  Brief Narrative: Patient is a 73 y.o. female with history of CAD status post PCI in October 2409, chronic systolic heart failure (EF 35-40% by TTE on 01/10/2018) presented to the hospital with exertional dyspnea, found to have a hemoglobin of 7.4.  See below for further details  Subjective: Lying comfortably in bed-no chest pain or shortness of breath.  Assessment/Plan: Symptomatic anemia: Etiology remains obscure-not sure if patient had a recent GI bleed that she was just not aware of, on November 2019 she had a similar presentation and was found to have gastric ulcers on EGD.  Iron panel consistent with iron deficiency-ferritin levels on the lower side, hemoglobin stable at 8.4-4 colonoscopy later today.  Will require iron supplementation on discharge-if she continues to have unexplained anemia without any overt GI bleeding-may require hematology evaluation in the outpatient setting.  Exertional dyspnea: Likely secondary to above-better with PRBC transfusion.No signs of volume overload.  History of CAD status post PCI to LAD on October 2019: No overt GI bleeding evident at this time-in fact FOBT is negative-discussion with GI-have restarted dual antiplatelets.  Patient denies hematochezia/melena overnight.    Chronic systolic heart failure (EF 35-40% by TTE on 01/10/2018): Volume status stable-continue Lasix, Coreg and Entresto.    DM-2: CBG stable with SSI.  Continue to hold oral hypoglycemic agents   Dyslipidemia: Continue statin.  CKD stage III: Creatinine close to usual baseline.  DVT Prophylaxis: SCD's  Code Status: Full code   Family Communication: None at bedside  Disposition Plan: Remain inpatient-hopefully home soon-depending on results of  endoscopic evaluation  Antimicrobial agents: Anti-infectives (From admission, onward)   None      Procedures: None  CONSULTS:  GI  Time spent: 25- minutes-Greater than 50% of this time was spent in counseling, explanation of diagnosis, planning of further management, and coordination of care.  MEDICATIONS: Scheduled Meds: . aspirin  81 mg Oral Daily  . atorvastatin  80 mg Oral q1800  . carvedilol  6.25 mg Oral BID  . cholecalciferol  2,000 Units Oral Daily  . clopidogrel  75 mg Oral Daily  . furosemide  20 mg Oral Daily  . insulin aspart  0-15 Units Subcutaneous TID WC  . insulin aspart  0-5 Units Subcutaneous QHS  . pantoprazole  40 mg Oral Daily  . sacubitril-valsartan  1 tablet Oral BID   Continuous Infusions: . sodium chloride Stopped (04/27/18 0715)   PRN Meds:.acetaminophen, bisacodyl, docusate sodium, nitroGLYCERIN, ondansetron **OR** ondansetron (ZOFRAN) IV   PHYSICAL EXAM: Vital signs: Vitals:   04/27/18 1450 04/27/18 1720 04/27/18 2128 04/28/18 0555  BP: (!) 136/49 (!) 147/118 (!) 156/66 (!) 131/54  Pulse: 68 76 69 73  Resp: 16 16  19   Temp: 98.3 F (36.8 C) (!) 97.5 F (36.4 C) 97.6 F (36.4 C) 98 F (36.7 C)  TempSrc: Oral Oral Oral Oral  SpO2: 97% 98% 99% 92%   There were no vitals filed for this visit. There is no height or weight on file to calculate BMI.   General appearance:Awake, alert, not in any distress.  Eyes:no scleral icterus. HEENT: Atraumatic and Normocephalic Neck: supple, no JVD. Resp:Good air entry  bilaterally,no rales or rhonchi CVS: S1 S2 regular GI: Bowel sounds present, Non tender and not distended with no gaurding, rigidity or rebound. Extremities: B/L Lower Ext shows no edema, both legs are warm to touch Neurology:  Non focal Psychiatric: Normal judgment and insight. Normal mood. Musculoskeletal:No digital cyanosis Skin:No Rash, warm and dry Wounds:N/A  I have personally reviewed following labs and imaging  studies  LABORATORY DATA: CBC: Recent Labs  Lab 04/26/18 1136 04/26/18 1819 04/27/18 0638 04/27/18 1833 04/28/18 0508  WBC 9.0 8.2 8.7  --  7.7  NEUTROABS 6.1  --   --   --   --   HGB 7.4* 7.5* 7.2* 9.7* 8.4*  HCT 24.6* 24.2*  23.3* 23.4* 30.5* 26.5*  MCV 91.8 90.0 89.3  --  88.6  PLT 305 299 286  --  440    Basic Metabolic Panel: Recent Labs  Lab 04/26/18 1136 04/27/18 0638 04/28/18 0508  NA 142 142 144  K 3.9 3.8 3.6  CL 101 102 103  CO2 29 29 34*  GLUCOSE 174* 138* 100*  BUN 21 20 17   CREATININE 1.40* 1.44* 1.35*  CALCIUM 8.8* 8.8* 8.6*    GFR: CrCl cannot be calculated (Unknown ideal weight.).  Liver Function Tests: Recent Labs  Lab 04/26/18 1136  AST 18  ALT 15  ALKPHOS 71  BILITOT 1.2  PROT 6.5  ALBUMIN 3.4*   Recent Labs  Lab 04/26/18 1136  LIPASE 25   No results for input(s): AMMONIA in the last 168 hours.  Coagulation Profile: No results for input(s): INR, PROTIME in the last 168 hours.  Cardiac Enzymes: No results for input(s): CKTOTAL, CKMB, CKMBINDEX, TROPONINI in the last 168 hours.  BNP (last 3 results) No results for input(s): PROBNP in the last 8760 hours.  HbA1C: No results for input(s): HGBA1C in the last 72 hours.  CBG: Recent Labs  Lab 04/27/18 0806 04/27/18 1335 04/27/18 1636 04/27/18 2130 04/28/18 0808  GLUCAP 126* 170* 75 121* 98    Lipid Profile: No results for input(s): CHOL, HDL, LDLCALC, TRIG, CHOLHDL, LDLDIRECT in the last 72 hours.  Thyroid Function Tests: No results for input(s): TSH, T4TOTAL, FREET4, T3FREE, THYROIDAB in the last 72 hours.  Anemia Panel: Recent Labs    04/26/18 1819 04/27/18 1134  VITAMINB12 294  --   FERRITIN  --  41  TIBC 435  --   IRON 26*  --   RETICCTPCT 2.1  --     Urine analysis:    Component Value Date/Time   COLORURINE YELLOW 04/27/2018 1415   APPEARANCEUR HAZY (A) 04/27/2018 1415   LABSPEC 1.017 04/27/2018 1415   PHURINE 6.0 04/27/2018 1415   GLUCOSEU  NEGATIVE 04/27/2018 1415   HGBUR NEGATIVE 04/27/2018 1415   BILIRUBINUR NEGATIVE 04/27/2018 1415   KETONESUR NEGATIVE 04/27/2018 1415   PROTEINUR 30 (A) 04/27/2018 1415   NITRITE POSITIVE (A) 04/27/2018 1415   LEUKOCYTESUR SMALL (A) 04/27/2018 1415    Sepsis Labs: Lactic Acid, Venous No results found for: LATICACIDVEN  MICROBIOLOGY: No results found for this or any previous visit (from the past 240 hour(s)).  RADIOLOGY STUDIES/RESULTS: Dg Chest 2 View  Result Date: 04/26/2018 CLINICAL DATA:  73 year old female with shortness of breath on exertion EXAM: CHEST - 2 VIEW COMPARISON:  Prior chest x-ray 03/20/2015 FINDINGS: Cardiac and mediastinal contours are within normal limits. Atherosclerotic calcifications are present in the transverse aorta. Stable background bronchitic changes and mild interstitial prominence. There is blunting of the cardio phrenic angles on lateral  view suggesting either small bilateral pleural effusions or bibasilar atelectasis. Otherwise, no focal airspace opacity. No acute osseous abnormality. IMPRESSION: 1. Trace bilateral pleural effusions versus lower lobe atelectasis. 2. Otherwise, stable chest x-ray without evidence of acute cardiopulmonary process. Electronically Signed   By: Jacqulynn Cadet M.D.   On: 04/26/2018 17:11     LOS: 2 days   Oren Binet, MD  [facility]  If 7PM-7AM, please contact night-coverage  Please page via [email or web address]-click on MD name and type text message  04/28/2018, 11:10 AM

## 2018-04-28 NOTE — Anesthesia Preprocedure Evaluation (Addendum)
Anesthesia Evaluation  Patient identified by MRN, date of birth, ID band Patient awake    Reviewed: Allergy & Precautions, H&P , NPO status , Patient's Chart, lab work & pertinent test results, reviewed documented beta blocker date and time   Airway Mallampati: II  TM Distance: >3 FB Neck ROM: Full    Dental no notable dental hx. (+) Edentulous Upper, Partial Lower, Dental Advisory Given   Pulmonary neg pulmonary ROS, former smoker,    Pulmonary exam normal breath sounds clear to auscultation- rhonchi       Cardiovascular hypertension, Pt. on medications and Pt. on home beta blockers + CAD and +CHF   Rhythm:Regular Rate:Normal     Neuro/Psych negative neurological ROS  negative psych ROS   GI/Hepatic negative GI ROS, Neg liver ROS,   Endo/Other  diabetes, Type 2, Oral Hypoglycemic Agents  Renal/GU Renal InsufficiencyRenal disease  negative genitourinary   Musculoskeletal   Abdominal   Peds  Hematology  (+) Blood dyscrasia, anemia ,   Anesthesia Other Findings   Reproductive/Obstetrics negative OB ROS                           Anesthesia Physical Anesthesia Plan  ASA: III  Anesthesia Plan: MAC   Post-op Pain Management:    Induction: Intravenous  PONV Risk Score and Plan: 2 and Propofol infusion and Treatment may vary due to age or medical condition  Airway Management Planned: Simple Face Mask  Additional Equipment:   Intra-op Plan:   Post-operative Plan:   Informed Consent: I have reviewed the patients History and Physical, chart, labs and discussed the procedure including the risks, benefits and alternatives for the proposed anesthesia with the patient or authorized representative who has indicated his/her understanding and acceptance.     Dental advisory given  Plan Discussed with: CRNA  Anesthesia Plan Comments:         Anesthesia Quick Evaluation

## 2018-04-28 NOTE — Op Note (Signed)
[facility] Patient Name: Karen Cooper Procedure Date : 04/28/2018 MRN: [id number] Attending MD: Carol Ada , MD Date of Birth: 05-31-1945 CSN: [id number] Age: 73 Admit Type: Inpatient Procedure:                Colonoscopy Indications:              Iron deficiency anemia Providers:                Carol Ada, MD, Raynelle Bring, RN, Burtis Junes, RN,                            Tinnie Gens, Technician, Dellie Catholic, CRNA Referring MD:              Medicines:                Propofol per Anesthesia Complications:            No immediate complications. Estimated Blood Loss:     Estimated blood loss: none. Procedure:                Pre-Anesthesia Assessment:                           - Prior to the procedure, a History and Physical                            was performed, and patient medications and                            allergies were reviewed. The patient's tolerance of                            previous anesthesia was also reviewed. The risks                            and benefits of the procedure and the sedation                            options and risks were discussed with the patient.                            All questions were answered, and informed consent                            was obtained. Prior Anticoagulants: The patient has                            taken no previous anticoagulant or antiplatelet                            agents. ASA Grade Assessment: III - A patient with                            severe systemic disease. After reviewing the risks  and benefits, the patient was deemed in                            satisfactory condition to undergo the procedure.                           - Sedation was administered by an anesthesia                            professional. Deep sedation was attained.                           After obtaining informed consent, the colonoscope                            was passed  under direct vision. Throughout the                            procedure, the patient's blood pressure, pulse, and                            oxygen saturations were monitored continuously. The                            CF-HQ190L ([id number] Olympus colonoscope was                            introduced through the anus and advanced to the the                            cecum, identified by appendiceal orifice and                            ileocecal valve. The colonoscopy was performed                            without difficulty. The patient tolerated the                            procedure well. The quality of the bowel                            preparation was good. The ileocecal valve,                            appendiceal orifice, and rectum were photographed. Scope In: 2:13:12 PM Scope Out: 2:33:33 PM Scope Withdrawal Time: 0 hours 14 minutes 34 seconds  Total Procedure Duration: 0 hours 20 minutes 21 seconds  Findings:      A 5 mm polyp was found in the descending colon. The polyp was sessile.       The polyp was removed with a cold snare. Resection and retrieval were       complete.      Scattered small and large-mouthed diverticula were found in the sigmoid  colon. Impression:               - One 5 mm polyp in the descending colon, removed                            with a cold snare. Resected and retrieved.                           - Diverticulosis in the sigmoid colon. Recommendation:           - Return patient to hospital ward for ongoing care.                           - Resume regular diet.                           - Continue present medications.                           - Await pathology results.                           - Repeat colonoscopy in 5 years for surveillance,                            if it is clinically appropriate. Procedure Code(s):        --- Professional ---                           [phone], Colonoscopy, flexible; with removal of                             tumor(s), polyp(s), or other lesion(s) by snare                            technique Diagnosis Code(s):        --- Professional ---                           D12.4, Benign neoplasm of descending colon                           D50.9, Iron deficiency anemia, unspecified                           K57.30, Diverticulosis of large intestine without                            perforation or abscess without bleeding CPT copyright 2018 [employer]. All rights reserved. The codes documented in this report are preliminary and upon coder review may  be revised to meet current compliance requirements. Carol Ada, MD Carol Ada, MD 04/28/2018 2:40:39 PM This report has been signed electronically. Number of Addenda: 0

## 2018-04-28 NOTE — Progress Notes (Signed)
Give one dose of Feraheme per Dr. Sloan Leiter.   Feraheme 510mg  IV x1  Onnie Boer, PharmD, [place], AAHIVP, CPP Infectious Disease Pharmacist 04/28/2018 3:15 PM

## 2018-04-28 NOTE — Transfer of Care (Signed)
Immediate Anesthesia Transfer of Care Note  Patient: Karen Cooper  Procedure(s) Performed: COLONOSCOPY WITH PROPOFOL (N/A ) POLYPECTOMY  Patient Location: Endoscopy Unit  Anesthesia Type:MAC  Level of Consciousness: awake, alert  and oriented  Airway & Oxygen Therapy: Patient Spontanous Breathing and Patient connected to face mask oxygen  Post-op Assessment: Report given to RN and Post -op Vital signs reviewed and stable  Post vital signs: Reviewed and stable  Last Vitals:  Vitals Value Taken Time  BP    Temp 36.4 C 04/28/2018  2:40 PM  Pulse 68 04/28/2018  2:41 PM  Resp 19 04/28/2018  2:41 PM  SpO2 100 % 04/28/2018  2:41 PM  Vitals shown include unvalidated device data.  Last Pain:  Vitals:   04/28/18 1440  TempSrc: Oral  PainSc: 0-No pain         Complications: No apparent anesthesia complications

## 2018-04-28 NOTE — Care Management Important Message (Signed)
Important Message  Patient Details  Name: Karen Cooper MRN: [id number] Date of Birth: 12-Jun-1945   Medicare Important Message Given:  Yes    Orbie Pyo 04/28/2018, 4:12 PM

## 2018-04-28 NOTE — Interval H&P Note (Signed)
History and Physical Interval Note:  04/28/2018 1:52 PM  Karen Cooper  has presented today for surgery, with the diagnosis of Anemia  The various methods of treatment have been discussed with the patient and family. After consideration of risks, benefits and other options for treatment, the patient has consented to  Procedure(s): COLONOSCOPY WITH PROPOFOL (N/A) as a surgical intervention .  The patient's history has been reviewed, patient examined, no change in status, stable for surgery.  I have reviewed the patient's chart and labs.  Questions were answered to the patient's satisfaction.     Meenakshi Sazama D

## 2018-04-28 NOTE — Discharge Summary (Signed)
PATIENT DETAILS Name: Karen Cooper Age: 73 y.o. Sex: female Date of Birth: 1945-09-24 MRN: [id number]. Admitting Physician: Janora Norlander, MD RCV:[id number], Leonia Reader, FNP  Admit Date: 04/26/2018 Discharge date: 04/28/2018  Recommendations for Outpatient Follow-up:  1. Follow up with PCP in 1-2 weeks 2. Please obtain BMP/CBC in one week 3. Follow colonoscopy biopsy results 4. Consider hematology referral if patient is still anemic in spite of iron supplementation   Admitted From:  Home   Disposition: [place]: No  Equipment/Devices: None  Discharge Condition: Stable  CODE STATUS: FULL CODE  Diet recommendation:  Heart Healthy / Carb Modified   Brief Summary: See H&P, Labs, Consult and Test reports for all details in brief, patient is a 73 year old female with history of CAD status post PCI in October 5436, chronic systolic heart failure who presented with symptomatic anemia, she was admitted for further work-up.    Brief Hospital Course: Symptomatic anemia: Etiology remains obscure-she probably had a GI bleed secondary to diverticulosis that she was not aware of.  In November 2019 she had a similar presentation and was found to have gastric ulcers on EGD.  She underwent colonoscopy evaluation that showed diverticulosis, and 1 small polyp.  Spoke with Dr. Milagros Evener further recommendations-okay to discharge.  Her ferritin is on the lower side of things, hence will start iron supplementation on discharge.  If she continues to be anemic in the future in spite of iron supplementation-please consider outpatient hematology evaluation.  Exertional dyspnea: Likely secondary to above-better with PRBC transfusion.No signs of volume overload.  History of CAD status post PCI to LAD on October 2019: No overt GI bleeding evident at this time-in fact FOBT is negative-discussion with GI-have restarted dual antiplatelets.  Patient denies hematochezia/melena overnight.      Chronic systolic heart failure (EF 35-40% by TTE on 01/10/2018): Volume status stable-continue Lasix, Coreg and Entresto.    DM-2: CBG stable with SSI.  Resume oral hypoglycemic agents on discharge. Marland Kitchen  Dyslipidemia: Continue statin.  CKD stage III: Creatinine close to usual baseline.  Note per patient-she is ambulating back and forth to the bathroom and does not have any issues at all.  She wants to go home.  Procedures/Studies: 1/17>> colonoscopy  Discharge Diagnoses:  Principal Problem:   Symptomatic anemia Active Problems:   CAD (coronary artery disease), native coronary artery   Essential hypertension   Dyslipidemia   Ischemic cardiomyopathy   CKD (chronic kidney disease) stage 3, GFR 30-59 ml/min (HCC)   Diabetes type 2, controlled (HCC)   Chronic systolic CHF (congestive heart failure) ([place])   Discharge Instructions:  Activity:  As tolerated with Full fall precautions use walker/cane & assistance as needed   Discharge Instructions    Call MD for:   Complete by:  As directed    Black stools or bloody stools   Diet - low sodium heart healthy   Complete by:  As directed    Diet Carb Modified   Complete by:  As directed    Discharge instructions   Complete by:  As directed    Follow with Primary MD  Holland Commons, FNP in 1 week  Follow with gastroenterologist MD-Dr. Benson Norway in the next 1-2 weeks  Please ask your primary care MD to refer you to a hematologist (blood doctor) if you are still anemic in spite of iron supplementation.  You had Gastrointestinal Bleeding: Please ask your Primary MD to check a complete blood count within one week  of discharge or at your next visit. Your colonoscopic biopsies that are pending at the time of discharge, will also need to followed by your Primary MD or by your primary gastroenterologist   Please get a complete blood count and chemistry panel checked by your Primary MD at your next visit, and again as instructed by  your Primary MD.  Get Medicines reviewed and adjusted: Please take all your medications with you for your next visit with your Primary MD  Laboratory/radiological data: Please request your Primary MD to go over all hospital tests and procedure/radiological results at the follow up, please ask your Primary MD to get all Hospital records sent to his/her office.  In some cases, they will be blood work, cultures and biopsy results pending at the time of your discharge. Please request that your primary care M.D. follows up on these results.  Also Note the following: If you experience worsening of your admission symptoms, develop shortness of breath, life threatening emergency, suicidal or homicidal thoughts you must seek medical attention immediately by calling 911 or calling your MD immediately  if symptoms less severe.  You must read complete instructions/literature along with all the possible adverse reactions/side effects for all the Medicines you take and that have been prescribed to you. Take any new Medicines after you have completely understood and accpet all the possible adverse reactions/side effects.   Do not drive when taking Pain medications or sleeping medications (Benzodaizepines)  Do not take more than prescribed Pain, Sleep and Anxiety Medications. It is not advisable to combine anxiety,sleep and pain medications without talking with your primary care practitioner  Special Instructions: If you have smoked or chewed Tobacco  in the last 2 yrs please stop smoking, stop any regular Alcohol  and or any Recreational drug use.  Wear Seat belts while driving.  Please note: You were cared for by a hospitalist during your hospital stay. Once you are discharged, your primary care physician will handle any further medical issues. Please note that NO REFILLS for any discharge medications will be authorized once you are discharged, as it is imperative that you return to your primary care  physician (or establish a relationship with a primary care physician if you do not have one) for your post hospital discharge needs so that they can reassess your need for medications and monitor your lab values.   Increase activity slowly   Complete by:  As directed      Allergies as of 04/28/2018   No Known Allergies     Medication List    TAKE these medications   acetaminophen 325 MG tablet Commonly known as:  TYLENOL Take 325-650 mg by mouth every 6 (six) hours as needed for mild pain or headache.   aspirin 81 MG chewable tablet Chew 1 tablet (81 mg total) by mouth daily.   atorvastatin 80 MG tablet Commonly known as:  LIPITOR Take 1 tablet (80 mg total) by mouth daily at 6 PM.   carvedilol 6.25 MG tablet Commonly known as:  COREG Take 1 tablet (6.25 mg total) by mouth 2 (two) times daily.   clopidogrel 75 MG tablet Commonly known as:  PLAVIX Take 1 tablet (75 mg total) by mouth daily.   ferrous sulfate 325 (65 FE) MG tablet Take 1 tablet (325 mg total) by mouth 2 (two) times daily with a meal.   furosemide 20 MG tablet Commonly known as:  LASIX Take 1 tablet (20 mg total) by mouth as needed for edema.  metFORMIN 750 MG 24 hr tablet Commonly known as:  GLUCOPHAGE-XR Take 1,500 mg by mouth daily with supper.   nitroGLYCERIN 0.4 MG SL tablet Commonly known as:  NITROSTAT Place 1 tablet (0.4 mg total) under the tongue every 5 (five) minutes as needed for chest pain.   pantoprazole 40 MG tablet Commonly known as:  PROTONIX Take 1 tablet (40 mg total) by mouth daily.   sacubitril-valsartan 97-103 MG Commonly known as:  ENTRESTO Take 1 tablet by mouth 2 (two) times daily.   Vitamin D3 25 MCG (1000 UT) Caps Take 2,000 Units by mouth daily.       No Known Allergies  Consultations:   GI   Other Procedures/Studies: Dg Chest 2 View  Result Date: 04/26/2018 CLINICAL DATA:  73 year old female with shortness of breath on exertion EXAM: CHEST - 2 VIEW  COMPARISON:  Prior chest x-ray 03/20/2015 FINDINGS: Cardiac and mediastinal contours are within normal limits. Atherosclerotic calcifications are present in the transverse aorta. Stable background bronchitic changes and mild interstitial prominence. There is blunting of the cardio phrenic angles on lateral view suggesting either small bilateral pleural effusions or bibasilar atelectasis. Otherwise, no focal airspace opacity. No acute osseous abnormality. IMPRESSION: 1. Trace bilateral pleural effusions versus lower lobe atelectasis. 2. Otherwise, stable chest x-ray without evidence of acute cardiopulmonary process. Electronically Signed   By: Jacqulynn Cadet M.D.   On: 04/26/2018 17:11      TODAY-DAY OF DISCHARGE:  Subjective:   Alvira Monday today has no headache,no chest abdominal pain,no new weakness tingling or numbness, feels much better wants to go home today.   Objective:   Blood pressure (!) 126/47, pulse 72, temperature 97.6 F (36.4 C), temperature source Oral, resp. rate 17, height 5\' 4"  (1.626 m), weight 97.5 kg, SpO2 100 %.  Intake/Output Summary (Last 24 hours) at 04/28/2018 1608 Last data filed at 04/28/2018 1434 Gross per 24 hour  Intake 4670 ml  Output -  Net 4670 ml   Filed Weights   04/28/18 1333  Weight: 97.5 kg    Exam: Awake Alert, Oriented *3, No new F.N deficits, Normal affect [place].AT,PERRAL Supple Neck,No JVD, No cervical lymphadenopathy appriciated.  Symmetrical Chest wall movement, Good air movement bilaterally, CTAB RRR,No Gallops,Rubs or new Murmurs, No Parasternal Heave +ve B.Sounds, Abd Soft, Non tender, No organomegaly appriciated, No rebound -guarding or rigidity. No Cyanosis, Clubbing or edema, No new Rash or bruise   PERTINENT RADIOLOGIC STUDIES: Dg Chest 2 View  Result Date: 04/26/2018 CLINICAL DATA:  73 year old female with shortness of breath on exertion EXAM: CHEST - 2 VIEW COMPARISON:  Prior chest x-ray 03/20/2015 FINDINGS: Cardiac and  mediastinal contours are within normal limits. Atherosclerotic calcifications are present in the transverse aorta. Stable background bronchitic changes and mild interstitial prominence. There is blunting of the cardio phrenic angles on lateral view suggesting either small bilateral pleural effusions or bibasilar atelectasis. Otherwise, no focal airspace opacity. No acute osseous abnormality. IMPRESSION: 1. Trace bilateral pleural effusions versus lower lobe atelectasis. 2. Otherwise, stable chest x-ray without evidence of acute cardiopulmonary process. Electronically Signed   By: Jacqulynn Cadet M.D.   On: 04/26/2018 17:11     PERTINENT LAB RESULTS: CBC: Recent Labs    04/27/18 0638 04/27/18 1833 04/28/18 0508  WBC 8.7  --  7.7  HGB 7.2* 9.7* 8.4*  HCT 23.4* 30.5* 26.5*  PLT 286  --  256   CMET CMP     Component Value Date/Time   NA 144 04/28/2018 0508  NA 142 02/21/2018 1009   K 3.6 04/28/2018 0508   CL 103 04/28/2018 0508   CO2 34 (H) 04/28/2018 0508   GLUCOSE 100 (H) 04/28/2018 0508   BUN 17 04/28/2018 0508   BUN 39 (H) 02/21/2018 1009   CREATININE 1.35 (H) 04/28/2018 0508   CALCIUM 8.6 (L) 04/28/2018 0508   PROT 6.5 04/26/2018 1136   PROT 6.1 02/10/2018 0921   ALBUMIN 3.4 (L) 04/26/2018 1136   ALBUMIN 3.8 02/10/2018 0921   AST 18 04/26/2018 1136   ALT 15 04/26/2018 1136   ALKPHOS 71 04/26/2018 1136   BILITOT 1.2 04/26/2018 1136   BILITOT 0.5 02/10/2018 0921   GFRNONAA 39 (L) 04/28/2018 0508   GFRAA 45 (L) 04/28/2018 0508    GFR Estimated Creatinine Clearance: 41.4 mL/min (A) (by C-G formula based on SCr of 1.35 mg/dL (H)). Recent Labs    04/26/18 1136  LIPASE 25   No results for input(s): CKTOTAL, CKMB, CKMBINDEX, TROPONINI in the last 72 hours. Invalid input(s): POCBNP No results for input(s): DDIMER in the last 72 hours. No results for input(s): HGBA1C in the last 72 hours. No results for input(s): CHOL, HDL, LDLCALC, TRIG, CHOLHDL, LDLDIRECT in the last  72 hours. No results for input(s): TSH, T4TOTAL, T3FREE, THYROIDAB in the last 72 hours.  Invalid input(s): FREET3 Recent Labs    04/26/18 1819 04/27/18 1134  VITAMINB12 294  --   FERRITIN  --  41  TIBC 435  --   IRON 26*  --   RETICCTPCT 2.1  --    Coags: No results for input(s): INR in the last 72 hours.  Invalid input(s): PT Microbiology: No results found for this or any previous visit (from the past 240 hour(s)).  FURTHER DISCHARGE INSTRUCTIONS:  Get Medicines reviewed and adjusted: Please take all your medications with you for your next visit with your Primary MD  Laboratory/radiological data: Please request your Primary MD to go over all hospital tests and procedure/radiological results at the follow up, please ask your Primary MD to get all Hospital records sent to his/her office.  In some cases, they will be blood work, cultures and biopsy results pending at the time of your discharge. Please request that your primary care M.D. goes through all the records of your hospital data and follows up on these results.  Also Note the following: If you experience worsening of your admission symptoms, develop shortness of breath, life threatening emergency, suicidal or homicidal thoughts you must seek medical attention immediately by calling 911 or calling your MD immediately  if symptoms less severe.  You must read complete instructions/literature along with all the possible adverse reactions/side effects for all the Medicines you take and that have been prescribed to you. Take any new Medicines after you have completely understood and accpet all the possible adverse reactions/side effects.   Do not drive when taking Pain medications or sleeping medications (Benzodaizepines)  Do not take more than prescribed Pain, Sleep and Anxiety Medications. It is not advisable to combine anxiety,sleep and pain medications without talking with your primary care practitioner  Special  Instructions: If you have smoked or chewed Tobacco  in the last 2 yrs please stop smoking, stop any regular Alcohol  and or any Recreational drug use.  Wear Seat belts while driving.  Please note: You were cared for by a hospitalist during your hospital stay. Once you are discharged, your primary care physician will handle any further medical issues. Please note that NO REFILLS for  any discharge medications will be authorized once you are discharged, as it is imperative that you return to your primary care physician (or establish a relationship with a primary care physician if you do not have one) for your post hospital discharge needs so that they can reassess your need for medications and monitor your lab values.  Total Time spent coordinating discharge including counseling, education and face to face time equals 35 minutes.  SignedOren Binet 04/28/2018 4:08 PM

## 2018-05-01 ENCOUNTER — Encounter (HOSPITAL_COMMUNITY): Payer: Self-pay | Admitting: Gastroenterology

## 2018-05-01 DIAGNOSIS — E785 Hyperlipidemia, unspecified: Secondary | ICD-10-CM | POA: Diagnosis not present

## 2018-05-01 DIAGNOSIS — Z8719 Personal history of other diseases of the digestive system: Secondary | ICD-10-CM | POA: Diagnosis not present

## 2018-05-01 DIAGNOSIS — I10 Essential (primary) hypertension: Secondary | ICD-10-CM | POA: Diagnosis not present

## 2018-05-01 DIAGNOSIS — Z7901 Long term (current) use of anticoagulants: Secondary | ICD-10-CM | POA: Diagnosis not present

## 2018-05-01 DIAGNOSIS — E119 Type 2 diabetes mellitus without complications: Secondary | ICD-10-CM | POA: Diagnosis not present

## 2018-05-01 DIAGNOSIS — D509 Iron deficiency anemia, unspecified: Secondary | ICD-10-CM | POA: Diagnosis not present

## 2018-05-01 DIAGNOSIS — Z955 Presence of coronary angioplasty implant and graft: Secondary | ICD-10-CM | POA: Diagnosis not present

## 2018-05-01 DIAGNOSIS — I2510 Atherosclerotic heart disease of native coronary artery without angina pectoris: Secondary | ICD-10-CM | POA: Diagnosis not present

## 2018-05-01 DIAGNOSIS — Z0000 Encounter for general adult medical examination without abnormal findings: Secondary | ICD-10-CM | POA: Diagnosis not present

## 2018-05-18 ENCOUNTER — Other Ambulatory Visit (HOSPITAL_COMMUNITY): Payer: Self-pay | Admitting: Medical

## 2018-05-22 DIAGNOSIS — D638 Anemia in other chronic diseases classified elsewhere: Secondary | ICD-10-CM | POA: Diagnosis not present

## 2018-05-29 DIAGNOSIS — D638 Anemia in other chronic diseases classified elsewhere: Secondary | ICD-10-CM | POA: Diagnosis not present

## 2018-05-29 DIAGNOSIS — Z8719 Personal history of other diseases of the digestive system: Secondary | ICD-10-CM | POA: Diagnosis not present

## 2018-05-29 DIAGNOSIS — R600 Localized edema: Secondary | ICD-10-CM | POA: Diagnosis not present

## 2018-05-29 DIAGNOSIS — Z7901 Long term (current) use of anticoagulants: Secondary | ICD-10-CM | POA: Diagnosis not present

## 2018-05-29 DIAGNOSIS — I509 Heart failure, unspecified: Secondary | ICD-10-CM | POA: Diagnosis not present

## 2018-05-29 DIAGNOSIS — D509 Iron deficiency anemia, unspecified: Secondary | ICD-10-CM | POA: Diagnosis not present

## 2018-06-01 DIAGNOSIS — R600 Localized edema: Secondary | ICD-10-CM | POA: Diagnosis not present

## 2018-06-01 DIAGNOSIS — I509 Heart failure, unspecified: Secondary | ICD-10-CM | POA: Diagnosis not present

## 2018-06-01 DIAGNOSIS — Z8719 Personal history of other diseases of the digestive system: Secondary | ICD-10-CM | POA: Diagnosis not present

## 2018-06-01 DIAGNOSIS — E785 Hyperlipidemia, unspecified: Secondary | ICD-10-CM | POA: Diagnosis not present

## 2018-06-01 DIAGNOSIS — I129 Hypertensive chronic kidney disease with stage 1 through stage 4 chronic kidney disease, or unspecified chronic kidney disease: Secondary | ICD-10-CM | POA: Diagnosis not present

## 2018-06-01 DIAGNOSIS — Z7901 Long term (current) use of anticoagulants: Secondary | ICD-10-CM | POA: Diagnosis not present

## 2018-06-01 DIAGNOSIS — E119 Type 2 diabetes mellitus without complications: Secondary | ICD-10-CM | POA: Diagnosis not present

## 2018-06-01 DIAGNOSIS — D638 Anemia in other chronic diseases classified elsewhere: Secondary | ICD-10-CM | POA: Diagnosis not present

## 2018-06-16 ENCOUNTER — Ambulatory Visit (INDEPENDENT_AMBULATORY_CARE_PROVIDER_SITE_OTHER): Payer: Medicare Other | Admitting: Cardiovascular Disease

## 2018-06-16 ENCOUNTER — Encounter: Payer: Self-pay | Admitting: *Deleted

## 2018-06-16 ENCOUNTER — Encounter: Payer: Self-pay | Admitting: Cardiovascular Disease

## 2018-06-16 VITALS — BP 166/90 | HR 83 | Ht 63.0 in | Wt 223.4 lb

## 2018-06-16 DIAGNOSIS — E1169 Type 2 diabetes mellitus with other specified complication: Secondary | ICD-10-CM

## 2018-06-16 DIAGNOSIS — N182 Chronic kidney disease, stage 2 (mild): Secondary | ICD-10-CM

## 2018-06-16 DIAGNOSIS — D500 Iron deficiency anemia secondary to blood loss (chronic): Secondary | ICD-10-CM

## 2018-06-16 DIAGNOSIS — I5022 Chronic systolic (congestive) heart failure: Secondary | ICD-10-CM | POA: Diagnosis not present

## 2018-06-16 DIAGNOSIS — E669 Obesity, unspecified: Secondary | ICD-10-CM

## 2018-06-16 DIAGNOSIS — D649 Anemia, unspecified: Secondary | ICD-10-CM

## 2018-06-16 DIAGNOSIS — I2510 Atherosclerotic heart disease of native coronary artery without angina pectoris: Secondary | ICD-10-CM | POA: Diagnosis not present

## 2018-06-16 DIAGNOSIS — I447 Left bundle-branch block, unspecified: Secondary | ICD-10-CM | POA: Diagnosis not present

## 2018-06-16 DIAGNOSIS — Z6839 Body mass index (BMI) 39.0-39.9, adult: Secondary | ICD-10-CM | POA: Diagnosis not present

## 2018-06-16 MED ORDER — POTASSIUM CHLORIDE ER 10 MEQ PO TBCR: 10.0000 meq | EXTENDED_RELEASE_TABLET | Freq: Every day | ORAL | 6 refills | Status: AC

## 2018-06-16 MED ORDER — FUROSEMIDE 40 MG PO TABS: 40.0000 mg | ORAL_TABLET | Freq: Every day | ORAL | 6 refills | Status: DC

## 2018-06-16 NOTE — Patient Instructions (Signed)
Medication Instructions:  Increase Lasix to 40 mg daily Start Kdur 10 meq daily If you need a refill on your cardiac medications before your next appointment, please call your pharmacy.   Lab work: Cmet,magnesium,cbc in 2 weeks can have done same day as Almyra Deforest PA's appointment  If you have labs (blood work) drawn today and your tests are completely normal, you will receive your results only by: Marland Kitchen MyChart Message (if you have MyChart) OR . A paper copy in the mail If you have any lab test that is abnormal or we need to change your treatment, we will call you to review the results.  Testing/Procedures: Schedule Echo 1 week before 3 month appointment with Dr.Croitoru  Follow-Up: At [facility], you and your health needs are our priority.  As part of our continuing mission to provide you with exceptional heart care, we have created designated Provider Care Teams.  These Care Teams include your primary Cardiologist (physician) and Advanced Practice Providers (APPs -  Physician Assistants and Nurse Practitioners) who all work together to provide you with the care you need, when you need it. . Schedule appointment with Dr.Croitoru in 3 months

## 2018-06-16 NOTE — Progress Notes (Signed)
Cardiology Office Note:    Date:  06/17/2018   ID:  Karen Cooper, DOB [birth year], MRN [id number]  PCP:  Holland Commons, FNP  Cardiologist:  Sanda Klein, MD  Electrophysiologist:  None   Referring MD: Holland Commons, [place]   Chief Complaint  Patient presents with  . Coronary Artery Disease  . Congestive Heart Failure   History of Present Illness:    Karen Cooper is a 73 y.o. female with recently diagnosed congestive heart failure and CAD after she presented with a new left bundle branch block and was found to have depressed left ventricular systolic function with ejection fraction 40%.  She underwent angioplasty and stenting on January 18, 2018 (3.0 x 15 mm Resolute Onyx DES).  She subsequently was hospitalized in November and again January with severe symptomatic anemia, requiring transfusion.  November 2019 her EGD showed gastric ulcers and her January colonoscopy showed diverticulosis.  She did not have overt bleeding January.  Dual antiplatelet therapy was continued.  She now has severe edema, mild orthopnea and severe exertional dyspnea.  Only about 5 pounds greater than her usual weight, but it appears that she has lost some true weight in the meantime.  She does not have angina pectoris and that his dizziness comes in with intermittent claudication or palpitations.  She is taking her birth on April 20 mg once daily.  She has not had any recurrent bleeding problems.  She is on high-dose atorvastatin.  She has a remote history of 40-pack-year smoking, quit last December.  She has treated hypertension.  She she is obese and has diabetes mellitus with good control (last hemoglobin A1c 6.5% on metformin only).  Reportedly has a history of hyperlipidemia.  Her most recent LDL cholesterol was 99 and she is currently not taking any lipid-lowering medications.  Her other lipid parameters are within desirable range.  She appears to have CKD stage III with a baseline creatinine around  1.3-1.5 and a GFR of 45-50.    She does have a family history of coronary artery disease, but not at young ages (sister died at age 2, mother had CHF died at age 103.).  Past Medical History:  Diagnosis Date  . Coronary artery disease   . Hypertension   . Symptomatic anemia 04/2018  . Type II diabetes mellitus ([place])     Past Surgical History:  Procedure Laterality Date  . BACK SURGERY    . CHOLECYSTECTOMY OPEN  1972  . COLONOSCOPY WITH PROPOFOL N/A 04/28/2018   Procedure: COLONOSCOPY WITH PROPOFOL;  Surgeon: Carol Ada, MD;  Location: [facility];  Service: Endoscopy;  Laterality: N/A;  . CORONARY ANGIOPLASTY WITH STENT PLACEMENT  01/18/2018  . CORONARY STENT INTERVENTION N/A 01/18/2018   Procedure: CORONARY STENT INTERVENTION;  Surgeon: Troy Sine, MD;  Location: [facility];  Service: Cardiovascular;  Laterality: N/A;  . ESOPHAGOGASTRODUODENOSCOPY  02/22/2018  . ESOPHAGOGASTRODUODENOSCOPY (EGD) WITH PROPOFOL N/A 02/22/2018   Procedure: ESOPHAGOGASTRODUODENOSCOPY (EGD) WITH PROPOFOL;  Surgeon: Wilford Corner, MD;  Location: [facility];  Service: Endoscopy;  Laterality: N/A;  . INCISION AND DRAINAGE  2004   "infection had settled in my back"  . LEFT HEART CATH AND CORONARY ANGIOGRAPHY N/A 01/18/2018   Procedure: LEFT HEART CATH AND CORONARY ANGIOGRAPHY;  Surgeon: Troy Sine, MD;  Location: [facility];  Service: Cardiovascular;  Laterality: N/A;  . [place]   ruptured disk  . POLYPECTOMY  04/28/2018   Procedure: POLYPECTOMY;  Surgeon: Carol Ada, MD;  Location: [facility];  Service: Endoscopy;;  . TONSILLECTOMY      Current Medications: Current Meds  Medication Sig  . acetaminophen (TYLENOL) 325 MG tablet Take 325-650 mg by mouth every 6 (six) hours as needed for mild pain or headache.  Marland Kitchen aspirin 81 MG chewable tablet Chew 1 tablet (81 mg total) by mouth daily.  Marland Kitchen atorvastatin (LIPITOR) 80 MG tablet Take 1 tablet (80 mg total)  by mouth daily at 6 PM.  . carvedilol (COREG) 6.25 MG tablet TAKE 1 TABLET BY MOUTH TWICE DAILY  . Cholecalciferol (VITAMIN D3) 1000 units CAPS Take 2,000 Units by mouth daily.   . clopidogrel (PLAVIX) 75 MG tablet Take 1 tablet (75 mg total) by mouth daily.  . ferrous sulfate 325 (65 FE) MG tablet Take 1 tablet (325 mg total) by mouth 2 (two) times daily with a meal.  . metFORMIN (GLUCOPHAGE-XR) 750 MG 24 hr tablet Take 1,500 mg by mouth daily with supper.   . pantoprazole (PROTONIX) 40 MG tablet Take 1 tablet (40 mg total) by mouth daily.  . sacubitril-valsartan (ENTRESTO) 97-103 MG Take 1 tablet by mouth 2 (two) times daily.  . [DISCONTINUED] furosemide (LASIX) 20 MG tablet Take 1 tablet (20 mg total) by mouth as needed for edema. (Patient taking differently: Take 20 mg by mouth daily. )     Allergies:   Patient has no known allergies.   Social History   Socioeconomic History  . Marital status: Married    Spouse name: Not on file  . Number of children: Not on file  . Years of education: Not on file  . Highest education level: Not on file  Occupational History  . Not on file  Social Needs  . Financial resource strain: Not on file  . Food insecurity:    Worry: Not on file    Inability: Not on file  . Transportation needs:    Medical: Not on file    Non-medical: Not on file  Tobacco Use  . Smoking status: Former Smoker    Packs/day: 1.00    Years: 40.00    Pack years: 40.00    Types: Cigarettes    Last attempt to quit: 04/03/2017    Years since quitting: 1.2  . Smokeless tobacco: Never Used  Substance and Sexual Activity  . Alcohol use: Never    Frequency: Never  . Drug use: Never  . Sexual activity: Not Currently  Lifestyle  . Physical activity:    Days per week: Not on file    Minutes per session: Not on file  . Stress: Not on file  Relationships  . Social connections:    Talks on phone: Not on file    Gets together: Not on file    Attends religious service:  Not on file    Active member of club or organization: Not on file    Attends meetings of clubs or organizations: Not on file    Relationship status: Not on file  Other Topics Concern  . Not on file  Social History Narrative  . Not on file     Family History: The patient's family history includes Cancer in her sister; Dementia in her mother; Diabetes in her brother; Heart attack in her brother and brother; Heart disease in her father; Heart failure in her mother.  ROS:   Please see the history of present illness.    All other systems are reviewed and are negative  EKGs/Labs/Other Studies  Reviewed:    The following studies were reviewed today: Nuclear perfusion images, echo images, angiography, reports of her endoscopic procedures  EKG:  EKG is not ordered today.     Physical Exam:    VS:  BP (!) 166/90   Pulse 83   Ht 5\' 3"  (1.6 m)   Wt 223 lb 6.4 oz (101.3 kg)   SpO2 98%   BMI 39.57 kg/m     Wt Readings from Last 3 Encounters:  06/16/18 223 lb 6.4 oz (101.3 kg)  04/28/18 215 lb (97.5 kg)  03/07/18 228 lb (103.4 kg)     General: Alert, oriented x3, no distress, severely obese Head: no evidence of trauma, PERRL, EOMI, no exophtalmos or lid lag, no myxedema, no xanthelasma; normal ears, nose and oropharynx Neck: normal jugular venous pulsations and no hepatojugular reflux; brisk carotid pulses without delay and no carotid bruits Chest: clear to auscultation, no signs of consolidation by percussion or palpation, normal fremitus, symmetrical and full respiratory excursions Cardiovascular: normal position and quality of the apical impulse, regular rhythm, normal first and paradoxically split second heart sounds, no murmurs, rubs or gallops Abdomen: no tenderness or distention, no masses by palpation, no abnormal pulsatility or arterial bruits, normal bowel sounds, no hepatosplenomegaly Extremities: no clubbing, cyanosis; 3+ symmetrical pitting edema to the knees; 2+ radial,  ulnar and brachial pulses bilaterally; 2+ right femoral, posterior tibial and dorsalis pedis pulses; 2+ left femoral, posterior tibial and dorsalis pedis pulses; no subclavian or femoral bruits Neurological: grossly nonfocal Psych: Normal mood and affect    ASSESSMENT:    1. Chronic systolic CHF (congestive heart failure) ([place])   2. Coronary artery disease involving native coronary artery of native heart without angina pectoris   3. LBBB (left bundle branch block)   4. Symptomatic anemia   5. Diabetes mellitus type 2 in obese ([facility])   6. Class 2 severe obesity due to excess calories with serious comorbidity and body mass index (BMI) of 39.0 to 39.9 in adult ([place])   7. CKD (chronic kidney disease) stage 2, GFR 60-89 ml/min   8. Iron deficiency anemia due to chronic blood loss    PLAN:    In order of problems listed above:  1. CHF: She appears to be markedly hypervolemic.  She is on maximum dose Entresto and on a moderate dose of carvedilol.  She is on the tightness of loop diuretic.  Reviewed sodium restriction and importance of daily weight monitoring.  Will increase her dose of furosemide, add a low dose of potassium supplementation and bring her back relatively quickly to make sure that she is returning to euvolemic state.  Despite physical exam, I expect that she needs to lose about 15 pounds of fluid. 2. CAD: She never had angina pectoris, she presented with depressed left ventricular systolic function. 3. LBBB may represent an option for CRT if left ventricular ejection fraction does not improve.  Now that she is on maximum dose of heart failure medications and she has had LAD revascularization, should repeat echocardiogram. 4. DM: Excellent control and would likely be "cured" if she lost substantial more weight. 5. HTN: Blood pressure is high.  She is hypervolemic.  Consider adding spironolactone if her blood pressure remains high after diuresis. 6. CKD 2-3: Baseline creatinine seems  to be 1.3-1.5.  We will recheck renal parameters when she comes back for follow-up. 7. Fe def anemia: Anemia will increase her tendency of decompensated heart failure.  Recheck CBC with upcoming labs.  He is to continue dual antiplatelet therapy at least through July 19, 2017, preferably for another 6 months after that.   Medication Adjustments/Labs and Tests Ordered: Current medicines are reviewed at length with the patient today.  Concerns regarding medicines are outlined above.  Orders Placed This Encounter  Procedures  . Comprehensive Metabolic Panel (CMET)  . Magnesium  . CBC w/Diff/Platelet  . ECHOCARDIOGRAM COMPLETE   Meds ordered this encounter  Medications  . furosemide (LASIX) 40 MG tablet    Sig: Take 1 tablet (40 mg total) by mouth daily.    Dispense:  30 tablet    Refill:  6  . potassium chloride (K-DUR) 10 MEQ tablet    Sig: Take 1 tablet (10 mEq total) by mouth daily.    Dispense:  30 tablet    Refill:  6    Patient Instructions  Medication Instructions:  Increase Lasix to 40 mg daily Start Kdur 10 meq daily If you need a refill on your cardiac medications before your next appointment, please call your pharmacy.   Lab work: Cmet,magnesium,cbc in 2 weeks can have done same day as Almyra Deforest PA's appointment  If you have labs (blood work) drawn today and your tests are completely normal, you will receive your results only by: Marland Kitchen MyChart Message (if you have MyChart) OR . A paper copy in the mail If you have any lab test that is abnormal or we need to change your treatment, we will call you to review the results.  Testing/Procedures: Schedule Echo 1 week before 3 month appointment with Dr.Dawanda Mapel  Follow-Up: At [facility], you and your health needs are our priority.  As part of our continuing mission to provide you with exceptional heart care, we have created designated Provider Care Teams.  These Care Teams include your primary Cardiologist (physician) and  Advanced Practice Providers (APPs -  Physician Assistants and Nurse Practitioners) who all work together to provide you with the care you need, when you need it. . Schedule appointment with Dr.Oliver Neuwirth in 3 months      Signed, Sanda Klein, MD  06/17/2018 10:37 PM    [place]

## 2018-06-17 ENCOUNTER — Encounter: Payer: Self-pay | Admitting: Cardiovascular Disease

## 2018-07-06 ENCOUNTER — Other Ambulatory Visit: Payer: Self-pay

## 2018-07-06 ENCOUNTER — Encounter: Payer: Self-pay | Admitting: Physician Assistant

## 2018-07-06 ENCOUNTER — Ambulatory Visit (INDEPENDENT_AMBULATORY_CARE_PROVIDER_SITE_OTHER): Payer: Medicare Other | Admitting: Physician Assistant

## 2018-07-06 VITALS — BP 150/70 | HR 84 | Ht 63.0 in | Wt 214.6 lb

## 2018-07-06 DIAGNOSIS — D649 Anemia, unspecified: Secondary | ICD-10-CM | POA: Diagnosis not present

## 2018-07-06 DIAGNOSIS — N183 Chronic kidney disease, stage 3 unspecified: Secondary | ICD-10-CM

## 2018-07-06 DIAGNOSIS — I10 Essential (primary) hypertension: Secondary | ICD-10-CM

## 2018-07-06 DIAGNOSIS — I447 Left bundle-branch block, unspecified: Secondary | ICD-10-CM | POA: Diagnosis not present

## 2018-07-06 DIAGNOSIS — I5023 Acute on chronic systolic (congestive) heart failure: Secondary | ICD-10-CM

## 2018-07-06 DIAGNOSIS — I5022 Chronic systolic (congestive) heart failure: Secondary | ICD-10-CM | POA: Diagnosis not present

## 2018-07-06 DIAGNOSIS — I2510 Atherosclerotic heart disease of native coronary artery without angina pectoris: Secondary | ICD-10-CM | POA: Diagnosis not present

## 2018-07-06 DIAGNOSIS — I255 Ischemic cardiomyopathy: Secondary | ICD-10-CM | POA: Diagnosis not present

## 2018-07-06 LAB — CBC WITH DIFFERENTIAL/PLATELET
Basophils Absolute: 0.1 10*3/uL (ref 0.0–0.2)
Basos: 1 %
EOS (ABSOLUTE): 0.3 10*3/uL (ref 0.0–0.4)
Eos: 3 %
Hematocrit: 29.4 % — ABNORMAL LOW (ref 34.0–46.6)
Hemoglobin: 9.7 g/dL — ABNORMAL LOW (ref 11.1–15.9)
IMMATURE GRANS (ABS): 0 10*3/uL (ref 0.0–0.1)
Immature Granulocytes: 0 %
LYMPHS: 24 %
Lymphocytes Absolute: 2.5 10*3/uL (ref 0.7–3.1)
MCH: 30.3 pg (ref 26.6–33.0)
MCHC: 33 g/dL (ref 31.5–35.7)
MCV: 92 fL (ref 79–97)
MONOCYTES: 9 %
Monocytes Absolute: 0.9 10*3/uL (ref 0.1–0.9)
Neutrophils Absolute: 6.4 10*3/uL (ref 1.4–7.0)
Neutrophils: 63 %
Platelets: 305 10*3/uL (ref 150–450)
RBC: 3.2 x10E6/uL — ABNORMAL LOW (ref 3.77–5.28)
RDW: 16.2 % — ABNORMAL HIGH (ref 11.7–15.4)
WBC: 10.2 10*3/uL (ref 3.4–10.8)

## 2018-07-06 LAB — COMPREHENSIVE METABOLIC PANEL
ALT: 17 IU/L (ref 0–32)
AST: 18 IU/L (ref 0–40)
Albumin/Globulin Ratio: 1.5 (ref 1.2–2.2)
Albumin: 3.8 g/dL (ref 3.7–4.7)
Alkaline Phosphatase: 104 IU/L (ref 39–117)
BUN/Creatinine Ratio: 21 (ref 12–28)
BUN: 28 mg/dL — ABNORMAL HIGH (ref 8–27)
Bilirubin Total: 0.4 mg/dL (ref 0.0–1.2)
CO2: 25 mmol/L (ref 20–29)
Calcium: 9.1 mg/dL (ref 8.7–10.3)
Chloride: 101 mmol/L (ref 96–106)
Creatinine, Ser: 1.32 mg/dL — ABNORMAL HIGH (ref 0.57–1.00)
GFR calc Af Amer: 46 mL/min/{1.73_m2} — ABNORMAL LOW (ref >59)
GFR calc non Af Amer: 39 mL/min/{1.73_m2} — ABNORMAL LOW (ref >59)
Globulin, Total: 2.5 g/dL (ref 1.5–4.5)
Glucose: 128 mg/dL — ABNORMAL HIGH (ref 65–99)
Potassium: 5.2 mmol/L (ref 3.5–5.2)
Sodium: 143 mmol/L (ref 134–144)
Total Protein: 6.3 g/dL (ref 6.0–8.5)

## 2018-07-06 LAB — MAGNESIUM: Magnesium: 1.2 mg/dL — ABNORMAL LOW (ref 1.6–2.3)

## 2018-07-06 MED ORDER — CLOPIDOGREL BISULFATE 75 MG PO TABS: 75.0000 mg | ORAL_TABLET | Freq: Every day | ORAL | 6 refills | Status: DC

## 2018-07-06 MED ORDER — CARVEDILOL 12.5 MG PO TABS: 12.5000 mg | ORAL_TABLET | Freq: Two times a day (BID) | ORAL | 2 refills | Status: DC

## 2018-07-06 MED ORDER — PANTOPRAZOLE SODIUM 40 MG PO TBEC: 40.0000 mg | DELAYED_RELEASE_TABLET | Freq: Every day | ORAL | 6 refills | Status: DC

## 2018-07-06 NOTE — Patient Instructions (Addendum)
Medication Instructions:   INCREASE CARVEDILOL to 12.5 Mg 2 times a day  If you need a refill on your cardiac medications before your next appointment, please call your pharmacy.   Lab work: You will have labs (blood work) drawn today that was previously ordered: CBC with Diff/Platelet CMET Magnesium  If you have labs (blood work) drawn today and your tests are completely normal, you will receive your results only by: Marland Kitchen MyChart Message (if you have MyChart) OR . A paper copy in the mail If you have any lab test that is abnormal or we need to change your treatment, we will call you to review the results.  Testing/Procedures:  Keep your scheduled appointment for you Echocardiogram    Follow-Up:  You will need to keep your scheduled follow up appointment with Sanda Klein, MD.

## 2018-07-06 NOTE — Progress Notes (Signed)
Cardiology Office Note    Date:  07/06/2018   ID:  Karen Cooper, DOB 03/09/1946, MRN [id number]  PCP:  Holland Commons, FNP  Cardiologist: Dr. Sallyanne Kuster   Chief Complaint  Patient presents with  . Follow-up    seen for Dr. Sallyanne Kuster    History of Present Illness:  Karen Cooper is a 73 y.o. female with PMH ofCKD stage III,HTNand remote history of smoking was noted to have new left bundle branch block. Both her echo and Myoview showed decreased LV systolic function with EF of around 40%. Myoviewalso showed a fixed anteroseptal and apical defect consistent with left bundle branch block but no ischemia. Echo suggested akinesis of the basal mid anteroseptal myocardium. To further assess, she underwent cardiac catheterization on 01/18/2018 which showed single-vessel CAD with 80% focal mid LAD stenosis treated with 3.0 x 15 mm resolute Onyx DES postdilated to 3.29 mm. Patient was placed on high-dose statin. She has quit smoking since December 2018.  I saw the patient on 02/21/2018 for dizziness, lab work showed she was severely anemic with hemoglobin of 6.9.  She was admitted to the hospital for acute GI bleed.  She was transfused with packed red blood cell.  We switched her Brilinta to Plavix to decrease bleeding risk.  Aspirin was on hold temporarily prior to EGD.  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  It was recommended for the patient to proceed with outpatient colonoscopy.  She was placed on Protonix.  Aspirin resumed after EGD.  GI plan to proceed with outpatient colonoscopy and possible capsule endoscopy if bleeding continues.  Patient was last seen by Dr. Sallyanne Kuster on 06/16/2018, she was felt to be volume overloaded.  She was placed on 40 mg daily of Lasix with 10 mEq daily of potassium chloride.  She returns today for repeat office visit.  She has lost 9  pounds since starting on the diuretic.  Her breathing has significantly improved as well.  On physical exam, her lower extremity edema has completely resolved.  Overall she is feeling well on the current medication.  She denies any obvious chest discomfort.  Blood pressure is elevated, I will increase carvedilol to 12.5 mg twice daily.  She will obtain CBC, magnesium, and CMP today.  She will return in 3 months to follow-up with Dr. Shirlee More and obtain an echocardiogram 1 week prior to that visit.    Past Medical History:  Diagnosis Date  . Coronary artery disease   . Hypertension   . Symptomatic anemia 04/2018  . Type II diabetes mellitus ([place])     Past Surgical History:  Procedure Laterality Date  . BACK SURGERY    . CHOLECYSTECTOMY OPEN  1972  . COLONOSCOPY WITH PROPOFOL N/A 04/28/2018   Procedure: COLONOSCOPY WITH PROPOFOL;  Surgeon: Carol Ada, MD;  Location: [facility];  Service: Endoscopy;  Laterality: N/A;  . CORONARY ANGIOPLASTY WITH STENT PLACEMENT  01/18/2018  . CORONARY STENT INTERVENTION N/A 01/18/2018   Procedure: CORONARY STENT INTERVENTION;  Surgeon: Troy Sine, MD;  Location: [facility];  Service: Cardiovascular;  Laterality: N/A;  . ESOPHAGOGASTRODUODENOSCOPY  02/22/2018  . ESOPHAGOGASTRODUODENOSCOPY (EGD) WITH PROPOFOL N/A 02/22/2018   Procedure: ESOPHAGOGASTRODUODENOSCOPY (EGD) WITH PROPOFOL;  Surgeon: Wilford Corner, MD;  Location: [facility];  Service: Endoscopy;  Laterality: N/A;  . INCISION AND DRAINAGE  2004   "infection  had settled in my back"  . LEFT HEART CATH AND CORONARY ANGIOGRAPHY N/A 01/18/2018   Procedure: LEFT HEART CATH AND CORONARY ANGIOGRAPHY;  Surgeon: Troy Sine, MD;  Location: [facility];  Service: Cardiovascular;  Laterality: N/A;  . [place]   ruptured disk  . POLYPECTOMY  04/28/2018   Procedure: POLYPECTOMY;  Surgeon: Carol Ada, MD;  Location: [facility] ENDOSCOPY;  Service: Endoscopy;;  .  TONSILLECTOMY      Current Medications: Outpatient Medications Prior to Visit  Medication Sig Dispense Refill  . acetaminophen (TYLENOL) 325 MG tablet Take 325-650 mg by mouth every 6 (six) hours as needed for mild pain or headache.    Marland Kitchen aspirin 81 MG chewable tablet Chew 1 tablet (81 mg total) by mouth daily. 90 tablet 3  . atorvastatin (LIPITOR) 80 MG tablet Take 1 tablet (80 mg total) by mouth daily at 6 PM. 90 tablet 3  . Cholecalciferol (VITAMIN D3) 1000 units CAPS Take 2,000 Units by mouth daily.     . ferrous sulfate 325 (65 FE) MG tablet Take 1 tablet (325 mg total) by mouth 2 (two) times daily with a meal. 60 tablet 0  . furosemide (LASIX) 40 MG tablet Take 1 tablet (40 mg total) by mouth daily. 30 tablet 6  . metFORMIN (GLUCOPHAGE-XR) 750 MG 24 hr tablet Take 1,500 mg by mouth daily with supper.   0  . nitroGLYCERIN (NITROSTAT) 0.4 MG SL tablet Place 1 tablet (0.4 mg total) under the tongue every 5 (five) minutes as needed for chest pain. 25 tablet 3  . potassium chloride (K-DUR) 10 MEQ tablet Take 1 tablet (10 mEq total) by mouth daily. 30 tablet 6  . sacubitril-valsartan (ENTRESTO) 97-103 MG Take 1 tablet by mouth 2 (two) times daily. 60 tablet 6  . carvedilol (COREG) 6.25 MG tablet TAKE 1 TABLET BY MOUTH TWICE DAILY 60 tablet 11  . clopidogrel (PLAVIX) 75 MG tablet Take 1 tablet (75 mg total) by mouth daily. 30 tablet 3  . pantoprazole (PROTONIX) 40 MG tablet Take 1 tablet (40 mg total) by mouth daily. 30 tablet 0   No facility-administered medications prior to visit.      Allergies:   Patient has no known allergies.   Social History   Socioeconomic History  . Marital status: Married    Spouse name: Not on file  . Number of children: Not on file  . Years of education: Not on file  . Highest education level: Not on file  Occupational History  . Not on file  Social Needs  . Financial resource strain: Not on file  . Food insecurity:    Worry: Not on file    Inability:  Not on file  . Transportation needs:    Medical: Not on file    Non-medical: Not on file  Tobacco Use  . Smoking status: Former Smoker    Packs/day: 1.00    Years: 40.00    Pack years: 40.00    Types: Cigarettes    Last attempt to quit: 04/03/2017    Years since quitting: 1.2  . Smokeless tobacco: Never Used  Substance and Sexual Activity  . Alcohol use: Never    Frequency: Never  . Drug use: Never  . Sexual activity: Not Currently  Lifestyle  . Physical activity:    Days per week: Not on file    Minutes per session: Not on file  . Stress: Not on file  Relationships  . Social  connections:    Talks on phone: Not on file    Gets together: Not on file    Attends religious service: Not on file    Active member of club or organization: Not on file    Attends meetings of clubs or organizations: Not on file    Relationship status: Not on file  Other Topics Concern  . Not on file  Social History Narrative  . Not on file     Family History:  The patient's family history includes Cancer in her sister; Dementia in her mother; Diabetes in her brother; Heart attack in her brother and brother; Heart disease in her father; Heart failure in her mother.   ROS:   Please see the history of present illness.    ROS All other systems reviewed and are negative.   PHYSICAL EXAM:   VS:  BP (!) 150/70 (BP Location: Right Arm, Patient Position: Sitting, Cuff Size: Normal)   Pulse 84   Ht 5\' 3"  (1.6 m)   Wt 214 lb 9.6 oz (97.3 kg)   BMI 38.01 kg/m    GEN: Well nourished, well developed, in no acute distress  HEENT: normal  Neck: no JVD, carotid bruits, or masses Cardiac: RRR; no murmurs, rubs, or gallops,no edema  Respiratory:  clear to auscultation bilaterally, normal work of breathing GI: soft, nontender, nondistended, + BS MS: no deformity or atrophy  Skin: warm and dry, no rash Neuro:  Alert and Oriented x 3, Strength and sensation are intact Psych: euthymic mood, full affect   Wt Readings from Last 3 Encounters:  07/06/18 214 lb 9.6 oz (97.3 kg)  06/16/18 223 lb 6.4 oz (101.3 kg)  04/28/18 215 lb (97.5 kg)      Studies/Labs Reviewed:   EKG:  EKG is not ordered today.   Recent Labs: 04/26/2018: ALT 15 04/28/2018: BUN 17; Creatinine, Ser 1.35; Hemoglobin 8.4; Platelets 256; Potassium 3.6; Sodium 144   Lipid Panel    Component Value Date/Time   CHOL 78 (L) 02/10/2018 0921   TRIG 131 02/10/2018 0921   HDL 34 (L) 02/10/2018 0921   CHOLHDL 2.3 02/10/2018 0921   LDLCALC 18 02/10/2018 0921    Additional studies/ records that were reviewed today include:   Echo 01/10/2018 LV EF: 35% -   40% Study Conclusions  - Left ventricle: The cavity size was normal. There was mild focal   basal hypertrophy of the septum. Systolic function was moderately   reduced. The estimated ejection fraction was in the range of 35%   to 40%. Diffuse hypokinesis. There is akinesis of the   basal-midanteroseptal myocardium. The study is not technically   sufficient to allow evaluation of LV diastolic function. Frequent   ectopy. - Ventricular septum: Septal motion showed abnormal function,   dyssynergy, and &quot;bounce&quot;. - Aortic valve: Trileaflet; mildly thickened, mildly calcified   leaflets. There was trivial regurgitation. - Pulmonary arteries: Systolic pressure was mildly increased.   Cath 01/18/2018  Mid RCA lesion is 30% stenosed.  Post intervention, there is a 0% residual stenosis.  Prox LAD to Mid LAD lesion is 80% stenosed.  A stent was successfully placed.   Predominant single-vessel CAD with 80% focal mid LAD stenosis after the takeoff of the first diagonal and septal perforating artery.  Normal ramus intermediate and left circumflex coronary artery.  Luminal irregularity of the RCA with smooth 30% mid stenosis.  LVEDP 10 mmHg.  Successful PCI to the mid LAD with ultimate insertion of a 3.0 x  15 mm Resolute Onyx DES stent postdilated to 3.29  mm with the 80% stenosis being reduced to 0%.  RECOMMENDATION: Medical therapy for concomitant CAD.  Initiation of high potency statin therapy.  Optimal blood pressure control with target blood pressure less than 130/80.  Recommend uninterrupted dual antiplatelet therapy with Aspirin 81mg  daily and Ticagrelor 90mg  twice daily for a minimum of 6 months (stable ischemic heart disease - Class I recommendation).  ASSESSMENT:    1. Acute on chronic systolic heart failure ([place])   2. Essential hypertension   3. CKD (chronic kidney disease), stage III ([place])   4. LBBB (left bundle branch block)   5. Anemia, unspecified type      PLAN:  In order of problems listed above:  1. Acute on chronic systolic heart failure: After patient was started on 40 mg daily of Lasix, she has lost 9 pounds, her lower extremity edema has completely resolved.  She is due for lab work today.  I will increase her carvedilol to 12.5 mg twice daily.  She is scheduled for echocardiogram prior to her next office visit in 68-month  2. Hypertension: Blood pressure elevated today, increase carvedilol to 12.5 mg twice daily.  Continue on Entresto  3. CKD stage III: Obtain complete metabolic panel and magnesium today  4. Anemia: Pending CBC     Medication Adjustments/Labs and Tests Ordered: Current medicines are reviewed at length with the patient today.  Concerns regarding medicines are outlined above.  Medication changes, Labs and Tests ordered today are listed in the Patient Instructions below. Patient Instructions  Medication Instructions:   INCREASE CARVEDILOL to 12.5 Mg 2 times a day  If you need a refill on your cardiac medications before your next appointment, please call your pharmacy.   Lab work: You will have labs (blood work) drawn today that was previously ordered: CBC with Diff/Platelet CMET Magnesium  If you have labs (blood work) drawn today and your tests are completely normal, you will receive  your results only by: Marland Kitchen MyChart Message (if you have MyChart) OR . A paper copy in the mail If you have any lab test that is abnormal or we need to change your treatment, we will call you to review the results.  Testing/Procedures:  Keep your scheduled appointment for you Echocardiogram    Follow-Up:  You will need to keep your scheduled follow up appointment with Sanda Klein, MD.         Signed, Almyra Deforest, [place]  07/06/2018 1:25 PM    [facility] [address], [place], [place]  [address] Phone: [phone]; Fax: [phone]

## 2018-07-07 NOTE — Progress Notes (Signed)
Great, thanks [employer]

## 2018-07-10 ENCOUNTER — Other Ambulatory Visit: Payer: Self-pay

## 2018-07-10 MED ORDER — MAGNESIUM OXIDE 400 MG PO TABS: 400.0000 mg | ORAL_TABLET | Freq: Every day | ORAL | 6 refills | Status: DC

## 2018-07-19 ENCOUNTER — Other Ambulatory Visit: Payer: Self-pay

## 2018-07-19 MED ORDER — SACUBITRIL-VALSARTAN 97-103 MG PO TABS: 1.0000 | ORAL_TABLET | Freq: Two times a day (BID) | ORAL | 6 refills | Status: AC

## 2018-07-27 DIAGNOSIS — E785 Hyperlipidemia, unspecified: Secondary | ICD-10-CM | POA: Diagnosis not present

## 2018-07-27 DIAGNOSIS — E119 Type 2 diabetes mellitus without complications: Secondary | ICD-10-CM | POA: Diagnosis not present

## 2018-07-27 DIAGNOSIS — R600 Localized edema: Secondary | ICD-10-CM | POA: Diagnosis not present

## 2018-07-27 DIAGNOSIS — D638 Anemia in other chronic diseases classified elsewhere: Secondary | ICD-10-CM | POA: Diagnosis not present

## 2018-08-03 DIAGNOSIS — I509 Heart failure, unspecified: Secondary | ICD-10-CM | POA: Diagnosis not present

## 2018-08-03 DIAGNOSIS — D509 Iron deficiency anemia, unspecified: Secondary | ICD-10-CM | POA: Diagnosis not present

## 2018-08-03 DIAGNOSIS — R7989 Other specified abnormal findings of blood chemistry: Secondary | ICD-10-CM | POA: Diagnosis not present

## 2018-08-03 DIAGNOSIS — Z8719 Personal history of other diseases of the digestive system: Secondary | ICD-10-CM | POA: Diagnosis not present

## 2018-08-03 DIAGNOSIS — E119 Type 2 diabetes mellitus without complications: Secondary | ICD-10-CM | POA: Diagnosis not present

## 2018-08-10 DIAGNOSIS — I129 Hypertensive chronic kidney disease with stage 1 through stage 4 chronic kidney disease, or unspecified chronic kidney disease: Secondary | ICD-10-CM | POA: Diagnosis not present

## 2018-08-29 ENCOUNTER — Telehealth (HOSPITAL_COMMUNITY): Payer: Self-pay | Admitting: *Deleted

## 2018-08-29 NOTE — Telephone Encounter (Signed)

## 2018-08-31 ENCOUNTER — Encounter (INDEPENDENT_AMBULATORY_CARE_PROVIDER_SITE_OTHER): Payer: Self-pay

## 2018-08-31 ENCOUNTER — Other Ambulatory Visit: Payer: Self-pay

## 2018-08-31 ENCOUNTER — Ambulatory Visit (HOSPITAL_COMMUNITY): Payer: Medicare Other | Attending: Internal Medicine

## 2018-08-31 DIAGNOSIS — I2510 Atherosclerotic heart disease of native coronary artery without angina pectoris: Secondary | ICD-10-CM | POA: Diagnosis not present

## 2018-08-31 DIAGNOSIS — I5022 Chronic systolic (congestive) heart failure: Secondary | ICD-10-CM | POA: Insufficient documentation

## 2018-08-31 DIAGNOSIS — I447 Left bundle-branch block, unspecified: Secondary | ICD-10-CM | POA: Diagnosis not present

## 2018-09-01 ENCOUNTER — Telehealth: Payer: Self-pay | Admitting: Cardiovascular Disease

## 2018-09-01 NOTE — Telephone Encounter (Signed)
Mychart pending, no smartphone, consent (verbal), pre reg complete 09/01/18 AF

## 2018-09-06 ENCOUNTER — Telehealth (INDEPENDENT_AMBULATORY_CARE_PROVIDER_SITE_OTHER): Payer: Medicare Other | Admitting: Cardiovascular Disease

## 2018-09-06 ENCOUNTER — Encounter: Payer: Self-pay | Admitting: Cardiovascular Disease

## 2018-09-06 VITALS — BP 148/82 | Ht 64.0 in | Wt 213.0 lb

## 2018-09-06 DIAGNOSIS — N183 Chronic kidney disease, stage 3 unspecified: Secondary | ICD-10-CM

## 2018-09-06 DIAGNOSIS — I255 Ischemic cardiomyopathy: Secondary | ICD-10-CM

## 2018-09-06 DIAGNOSIS — D500 Iron deficiency anemia secondary to blood loss (chronic): Secondary | ICD-10-CM

## 2018-09-06 DIAGNOSIS — E785 Hyperlipidemia, unspecified: Secondary | ICD-10-CM

## 2018-09-06 DIAGNOSIS — Z720 Tobacco use: Secondary | ICD-10-CM

## 2018-09-06 DIAGNOSIS — I10 Essential (primary) hypertension: Secondary | ICD-10-CM | POA: Diagnosis not present

## 2018-09-06 DIAGNOSIS — E1122 Type 2 diabetes mellitus with diabetic chronic kidney disease: Secondary | ICD-10-CM | POA: Diagnosis not present

## 2018-09-06 DIAGNOSIS — I2510 Atherosclerotic heart disease of native coronary artery without angina pectoris: Secondary | ICD-10-CM

## 2018-09-06 DIAGNOSIS — I5022 Chronic systolic (congestive) heart failure: Secondary | ICD-10-CM

## 2018-09-06 MED ORDER — ATORVASTATIN CALCIUM 20 MG PO TABS: 20.0000 mg | ORAL_TABLET | Freq: Every day | ORAL | 3 refills | Status: DC

## 2018-09-06 MED ORDER — FUROSEMIDE 40 MG PO TABS: ORAL_TABLET | ORAL | 1 refills | Status: DC

## 2018-09-06 NOTE — Progress Notes (Signed)
Virtual Visit via Telephone Note   This visit type was conducted due to national recommendations for restrictions regarding the COVID-19 Pandemic (e.g. social distancing) in an effort to limit this patient's exposure and mitigate transmission in our community.  Due to her co-morbid illnesses, this patient is at least at moderate risk for complications without adequate follow up.  This format is felt to be most appropriate for this patient at this time.  The patient did not have access to video technology/had technical difficulties with video requiring transitioning to audio format only (telephone).  All issues noted in this document were discussed and addressed.  No physical exam could be performed with this format.  Please refer to the patient's chart for her  consent to telehealth for [facility].   Date:  09/06/2018   ID:  Karen Cooper, DOB [birth year], MRN [id number]  Patient Location: Home Provider Location: Home  PCP:  Holland Commons, FNP  Cardiologist:  Sanda Klein, MD  Electrophysiologist:  None   Evaluation Performed:  Follow-Up Visit  Chief Complaint: CAD, CHF, LBBB  History of Present Illness:    Karen Cooper is a 73 y.o. female with incidentally discovered coronary artery disease (high-grade proximal LAD stenosis) despite the absence of angina pectoris, presenting has cardiomyopathy and left bundle branch block.  She underwent placement of a drug-eluting stent (3.015) to the proximal LAD in October 2019.  She was also started on heart failure medications (Entresto, beta-blockers) and her ejection fraction has increased from 35-40% up to 50% earlier this month.  The patient specifically denies any chest pain at rest exertion, dyspnea at rest or with exertion, orthopnea, paroxysmal nocturnal dyspnea, syncope, palpitations, focal neurological deficits, intermittent claudication, lower extremity edema, unexplained weight gain, cough, hemoptysis or wheezing.  On  high-dose statin therapy her LDL cholesterol is extremely low at 18.  She did not have an acute coronary syndrome at presentation and that is probably not necessary to continue treatment with high-dose statin.  She was hospitalized in November 2019 and January 2020 with severe symptomatic anemia requiring transfusions.  Her upper endoscopy in November showed gastric ulcers and her colonoscopy in January showed diverticulosis.  Labs performed in late March showed that her hemoglobin had improved to 9.7, up from 7.2 in January  Also in March her creatinine was stable at her baseline 1.3-1.4.  She has not been checking her blood pressure regularly, today her blood pressure was a little high.  She stopped smoking in May 2018 but has a 40-pack-year history of smoking.  The patient does not have symptoms concerning for COVID-19 infection (fever, chills, cough, or new shortness of breath).   She has a remote history of 40-pack-year smoking, quit last December.  She has treated hypertension.  She she is obese and has diabetes mellitus with good control (last hemoglobin A1c 6.5% on metformin only).  Reportedly has a history of hyperlipidemia.  Her most recent LDL cholesterol was 99 and she is currently not taking any lipid-lowering medications.  Her other lipid parameters are within desirable range.  She appears to have CKD stage III with a baseline creatinine around 1.3-1.5 and a GFR of 45-50.    She does have a family history of coronary artery disease, but not at young ages (sister died at age 55, mother had CHF died at age 24.).    Past Medical History:  Diagnosis Date   Coronary artery disease    Hypertension    Symptomatic anemia 04/2018  Type II diabetes mellitus ([place])    Past Surgical History:  Procedure Laterality Date   BACK SURGERY     CHOLECYSTECTOMY OPEN  1972   COLONOSCOPY WITH PROPOFOL N/A 04/28/2018   Procedure: COLONOSCOPY WITH PROPOFOL;  Surgeon: Carol Ada, MD;   Location: [place];  Service: Endoscopy;  Laterality: N/A;   CORONARY ANGIOPLASTY WITH STENT PLACEMENT  01/18/2018   CORONARY STENT INTERVENTION N/A 01/18/2018   Procedure: CORONARY STENT INTERVENTION;  Surgeon: Troy Sine, MD;  Location: [place] [facility];  Service: Cardiovascular;  Laterality: N/A;   ESOPHAGOGASTRODUODENOSCOPY  02/22/2018   ESOPHAGOGASTRODUODENOSCOPY (EGD) WITH PROPOFOL N/A 02/22/2018   Procedure: ESOPHAGOGASTRODUODENOSCOPY (EGD) WITH PROPOFOL;  Surgeon: Wilford Corner, MD;  Location: [place];  Service: Endoscopy;  Laterality: N/A;   INCISION AND DRAINAGE  2004   "infection had settled in my back"   LEFT HEART CATH AND CORONARY ANGIOGRAPHY N/A 01/18/2018   Procedure: LEFT HEART CATH AND CORONARY ANGIOGRAPHY;  Surgeon: Troy Sine, MD;  Location: [place] [facility];  Service: Cardiovascular;  Laterality: N/A;   [place]   ruptured disk   POLYPECTOMY  04/28/2018   Procedure: POLYPECTOMY;  Surgeon: Carol Ada, MD;  Location: [place];  Service: Endoscopy;;   TONSILLECTOMY       Current Meds  Medication Sig   acetaminophen (TYLENOL) 325 MG tablet Take 325-650 mg by mouth every 6 (six) hours as needed for mild pain or headache.   aspirin 81 MG chewable tablet Chew 1 tablet (81 mg total) by mouth daily.   atorvastatin (LIPITOR) 80 MG tablet Take 1 tablet (80 mg total) by mouth daily at 6 PM.   carvedilol (COREG) 12.5 MG tablet Take 1 tablet (12.5 mg total) by mouth 2 (two) times daily.   Cholecalciferol (VITAMIN D3) 1000 units CAPS Take 2,000 Units by mouth daily.    clopidogrel (PLAVIX) 75 MG tablet Take 1 tablet (75 mg total) by mouth daily.   ferrous sulfate 325 (65 FE) MG tablet Take 1 tablet (325 mg total) by mouth 2 (two) times daily with a meal.   furosemide (LASIX) 40 MG tablet Take 1 tablet (40 mg total) by mouth daily.   magnesium oxide (MAG-OX) 400 MG tablet Take 1 tablet (400 mg total) by mouth daily.     metFORMIN (GLUCOPHAGE-XR) 750 MG 24 hr tablet Take 1,500 mg by mouth daily with supper.    nitroGLYCERIN (NITROSTAT) 0.4 MG SL tablet Place 1 tablet (0.4 mg total) under the tongue every 5 (five) minutes as needed for chest pain.   pantoprazole (PROTONIX) 40 MG tablet Take 1 tablet (40 mg total) by mouth daily.   sacubitril-valsartan (ENTRESTO) 97-103 MG Take 1 tablet by mouth 2 (two) times daily.     Allergies:   Patient has no known allergies.   Social History   Tobacco Use   Smoking status: Former Smoker    Packs/day: 1.00    Years: 40.00    Pack years: 40.00    Types: Cigarettes    Last attempt to quit: 04/03/2017    Years since quitting: 1.4   Smokeless tobacco: Never Used  Substance Use Topics   Alcohol use: Never    Frequency: Never   Drug use: Never     Family Hx: The patient's family history includes Cancer in her sister; Dementia in her mother; Diabetes in her brother; Heart attack in her brother and brother; Heart disease in her father; Heart failure in her mother.  ROS:  Please see the history of present illness.     All other systems reviewed and are negative.   Prior CV studies:   The following studies were reviewed today:  Echocardiogram 08/31/2018   1. The left ventricle has a visually estimated ejection fraction of 50%. The cavity size was normal. Left ventricular diastolic Doppler parameters are consistent with impaired relaxation.  2. LVEF is approximately 50% with hypokinesis of the inferior and septal walls. Compared to report from 2019, LVEF is improved.  3. The right ventricle has normal systolic function. The cavity was normal. There is no increase in right ventricular wall thickness.  4. The mitral valve is abnormal. Mild thickening of the mitral valve leaflet. There is mild mitral annular calcification present.  5. The aortic valve is abnormal. Mild thickening of the aortic valve. Mild calcification of the aortic valve. Aortic valve  regurgitation is trivial by color flow Doppler.  6. AV is thickened with minimally restricted motion.  7. The interatrial septum was not assessed.  Cardiac catheterization 01/18/2018 Claib[facility])   Mid RCA lesion is 30% stenosed.  Post intervention, there is a 0% residual stenosis.  Prox LAD to Mid LAD lesion is 80% stenosed.  A stent was successfully placed.   Predominant single-vessel CAD with 80% focal mid LAD stenosis after the takeoff of the first diagonal and septal perforating artery.  Normal ramus intermediate and left circumflex coronary artery.  Luminal irregularity of the RCA with smooth 30% mid stenosis.  LVEDP 10 mmHg.  Successful PCI to the mid LAD with ultimate insertion of a 3.0 x 15 mm Resolute Onyx DES stent postdilated to 3.29 mm with the 80% stenosis being reduced to 0%.  Labs/Other Tests and Data Reviewed:    EKG:  An ECG dated 04/27/2018 was personally reviewed today and demonstrated:  Sinus rhythm left bundle branch block.  Recent Labs: 07/06/2018: ALT 17; BUN 28; Creatinine, Ser 1.32; Hemoglobin 9.7; Magnesium 1.2; Platelets 305; Potassium 5.2; Sodium 143   Recent Lipid Panel Lab Results  Component Value Date/Time   CHOL 78 (L) 02/10/2018 09:21 AM   TRIG 131 02/10/2018 09:21 AM   HDL 34 (L) 02/10/2018 09:21 AM   CHOLHDL 2.3 02/10/2018 09:21 AM   LDLCALC 18 02/10/2018 09:21 AM    Wt Readings from Last 3 Encounters:  09/06/18 213 lb (96.6 kg)  07/06/18 214 lb 9.6 oz (97.3 kg)  06/16/18 223 lb 6.4 oz (101.3 kg)     Objective:    Vital Signs:  BP (!) 148/82    Ht 5\' 4"  (1.626 m)    Wt 213 lb (96.6 kg)    BMI 36.56 kg/m    VITAL SIGNS:  reviewed Unable to examine  ASSESSMENT & PLAN:    1. CAD: she never had angina pectoris or even much dyspnea and I do not think we can rely on symptoms to detect restenosis.  Her ECG is nondiagnostic due to left bundle branch block.  We will schedule for Lexiscan Myoview roughly 1 year after stent placement.   If there is no evidence of coronary insufficiency, will stop clopidogrel at that point (although she needs to temporarily interrupt the drug for medical procedures or bleeding at this point, that could be safely done; her stent was not implanted for acute coronary syndrome. 2. CHF: Remarkably significant improvement in LVEF, almost back to normal range.  In fact, after accounting for the lack of synchrony with left bundle branch block, probably normal.  Plan to continue [place] and  carvedilol.  As far as I can tell she is euvolemic and has no symptoms of heart failure.  We will try to wean her off the diuretic.  Asked her to cut it back to only 4 days a week (Tuesday Thursday Saturday and Sunday).  If she does not have evidence of fluid accumulation or dyspnea, will discontinue it completely in 1 month. 3. HTN: Her blood pressure is high today, but I am not sure whether this is truly representative.  I asked her to keep a one-week log of her blood pressure and send it to [place]. 4. LBBB: This led to her initial evaluation, but persists after revascularization. 5. Dyslipidemia: She has low HDL cholesterol but on the current dose of statin also has extremely low LDL cholesterol.  Would reduce the atorvastatin to 20 mg daily.  Target LDL less than 70. 6. DM: On metformin monotherapy. 7. CKD 3: Creatinine stable at baseline of 1.3-1.4. 8. Anemia: Likely multifactorial but in part related to GI bleeding with evidence of iron deficiency on labs performed in January.  She should continue taking the iron supplement as long as she is taking dual antiplatelet therapy.  COVID-19 Education: The signs and symptoms of COVID-19 were discussed with the patient and how to seek care for testing (follow up with PCP or arrange E-visit).  The importance of social distancing was discussed today.  Time:   Today, I have spent 29 minutes with the patient with telehealth technology discussing the above problems.     Medication  Adjustments/Labs and Tests Ordered: Current medicines are reviewed at length with the patient today.  Concerns regarding medicines are outlined above.   Tests Ordered: No orders of the defined types were placed in this encounter.   Medication Changes: No orders of the defined types were placed in this encounter.   Disposition:  Follow up after her nuclear perfusion study in October   Patient Instructions  Medication Instructions:  Take Kdur 10 meq daily Decrease Furosemide to 40 mg 4 times a week ( Tue,Thurs,Sat,Sun ) for 1 month after 1 month if you do not develop any swelling or shortness of breath you can stop taking Decrease Atorvastatin to 20 mg daily  If you need a refill on your cardiac medications before your next appointment, please call your pharmacy.   Lab work: Health visitor and Lipid panel you can have done same day as stress test Carlton Adam ) in early October     Testing/Procedures: Scheduler will call you to schedule Lexiscan stress test in early October  You may take your medications that morning she will mail you the instructions  Follow-Up: At [facility], you and your health needs are our priority.  As part of our continuing mission to provide you with exceptional heart care, we have created designated Provider Care Teams.  These Care Teams include your primary Cardiologist (physician) and Advanced Practice Providers (APPs -  Physician Assistants and Nurse Practitioners) who all work together to provide you with the care you need, when you need it.  Check your blood pressure daily for 1 week  Mail Dr.Fedra Lanter your readings  Scheduler will schedule follow up appointment 1 week after stress test       Signed, Sanda Klein, MD  09/06/2018 2:20 PM    [place]

## 2018-09-06 NOTE — Addendum Note (Signed)
Addended by: Kathyrn Lass on: 09/06/2018 06:18 PM   Modules accepted: Orders

## 2018-09-06 NOTE — Patient Instructions (Addendum)
Medication Instructions:  Take Kdur 10 meq daily Decrease Furosemide to 40 mg 4 times a week ( Tue,Thurs,Sat,Sun ) for 1 month after 1 month if you do not develop any swelling or shortness of breath you can stop taking Decrease Atorvastatin to 20 mg daily  If you need a refill on your cardiac medications before your next appointment, please call your pharmacy.   Lab work: Health visitor and Lipid panel you can have done same day as stress test Karen Cooper ) in early October     Testing/Procedures: Scheduler will call you to schedule Lexiscan stress test in early October  You may take your medications that morning she will mail you the instructions  Follow-Up: At [facility], you and your health needs are our priority.  As part of our continuing mission to provide you with exceptional heart care, we have created designated Provider Care Teams.  These Care Teams include your primary Cardiologist (physician) and Advanced Practice Providers (APPs -  Physician Assistants and Nurse Practitioners) who all work together to provide you with the care you need, when you need it. . Check your blood pressure daily for 1 week  Mail Dr.Croitoru your readings . Scheduler will schedule follow up appointment 1 week after stress test

## 2018-10-25 DIAGNOSIS — E785 Hyperlipidemia, unspecified: Secondary | ICD-10-CM | POA: Diagnosis not present

## 2018-10-25 DIAGNOSIS — E119 Type 2 diabetes mellitus without complications: Secondary | ICD-10-CM | POA: Diagnosis not present

## 2018-10-25 DIAGNOSIS — D509 Iron deficiency anemia, unspecified: Secondary | ICD-10-CM | POA: Diagnosis not present

## 2018-10-26 ENCOUNTER — Other Ambulatory Visit: Payer: Self-pay

## 2018-10-26 MED ORDER — PANTOPRAZOLE SODIUM 40 MG PO TBEC: 40.0000 mg | DELAYED_RELEASE_TABLET | Freq: Every day | ORAL | 6 refills | Status: DC

## 2018-10-26 MED ORDER — CLOPIDOGREL BISULFATE 75 MG PO TABS: 75.0000 mg | ORAL_TABLET | Freq: Every day | ORAL | 6 refills | Status: DC

## 2018-11-09 ENCOUNTER — Other Ambulatory Visit: Payer: Self-pay

## 2019-01-05 DIAGNOSIS — Z23 Encounter for immunization: Secondary | ICD-10-CM | POA: Diagnosis not present

## 2019-02-05 ENCOUNTER — Other Ambulatory Visit: Payer: Self-pay | Admitting: Physician Assistant

## 2019-02-16 ENCOUNTER — Telehealth (HOSPITAL_COMMUNITY): Payer: Self-pay

## 2019-02-16 NOTE — Telephone Encounter (Signed)
Pt elected to reschedule for safety of all due to her recent exposure to COVID-19. Pt call transferred to [facility].   Encounter complete.

## 2019-02-21 ENCOUNTER — Encounter (HOSPITAL_COMMUNITY): Payer: Medicare Other

## 2019-03-06 ENCOUNTER — Inpatient Hospital Stay (HOSPITAL_COMMUNITY): Admission: RE | Admit: 2019-03-06 | Payer: Medicare Other | Source: Ambulatory Visit

## 2019-05-03 ENCOUNTER — Other Ambulatory Visit: Payer: Self-pay | Admitting: Physician Assistant

## 2019-05-07 ENCOUNTER — Encounter: Payer: Self-pay | Admitting: Cardiovascular Disease

## 2019-05-07 DIAGNOSIS — N183 Chronic kidney disease, stage 3 unspecified: Secondary | ICD-10-CM | POA: Diagnosis not present

## 2019-05-07 DIAGNOSIS — E785 Hyperlipidemia, unspecified: Secondary | ICD-10-CM | POA: Diagnosis not present

## 2019-05-07 DIAGNOSIS — I10 Essential (primary) hypertension: Secondary | ICD-10-CM | POA: Diagnosis not present

## 2019-05-07 DIAGNOSIS — E1122 Type 2 diabetes mellitus with diabetic chronic kidney disease: Secondary | ICD-10-CM | POA: Diagnosis not present

## 2019-05-07 DIAGNOSIS — I129 Hypertensive chronic kidney disease with stage 1 through stage 4 chronic kidney disease, or unspecified chronic kidney disease: Secondary | ICD-10-CM | POA: Diagnosis not present

## 2019-05-08 ENCOUNTER — Encounter: Payer: Self-pay | Admitting: Cardiovascular Disease

## 2019-05-08 DIAGNOSIS — D509 Iron deficiency anemia, unspecified: Secondary | ICD-10-CM | POA: Diagnosis not present

## 2019-05-08 DIAGNOSIS — E119 Type 2 diabetes mellitus without complications: Secondary | ICD-10-CM | POA: Diagnosis not present

## 2019-05-08 DIAGNOSIS — I2510 Atherosclerotic heart disease of native coronary artery without angina pectoris: Secondary | ICD-10-CM | POA: Diagnosis not present

## 2019-05-08 DIAGNOSIS — Z7901 Long term (current) use of anticoagulants: Secondary | ICD-10-CM | POA: Diagnosis not present

## 2019-05-08 DIAGNOSIS — E875 Hyperkalemia: Secondary | ICD-10-CM | POA: Diagnosis not present

## 2019-05-08 DIAGNOSIS — Z01419 Encounter for gynecological examination (general) (routine) without abnormal findings: Secondary | ICD-10-CM | POA: Diagnosis not present

## 2019-05-08 DIAGNOSIS — Z0000 Encounter for general adult medical examination without abnormal findings: Secondary | ICD-10-CM | POA: Diagnosis not present

## 2019-05-08 DIAGNOSIS — Z8719 Personal history of other diseases of the digestive system: Secondary | ICD-10-CM | POA: Diagnosis not present

## 2019-05-08 DIAGNOSIS — E7800 Pure hypercholesterolemia, unspecified: Secondary | ICD-10-CM | POA: Diagnosis not present

## 2019-05-08 DIAGNOSIS — I10 Essential (primary) hypertension: Secondary | ICD-10-CM | POA: Diagnosis not present

## 2019-06-11 ENCOUNTER — Other Ambulatory Visit: Payer: Self-pay | Admitting: *Deleted

## 2019-06-11 MED ORDER — ATORVASTATIN CALCIUM 20 MG PO TABS: 20.0000 mg | ORAL_TABLET | Freq: Every day | ORAL | 1 refills | Status: AC

## 2019-06-11 MED ORDER — FUROSEMIDE 40 MG PO TABS: ORAL_TABLET | ORAL | 5 refills | Status: AC

## 2019-06-11 NOTE — Telephone Encounter (Signed)
Rx has been sent to the pharmacy electronically. ° °

## 2019-06-12 ENCOUNTER — Other Ambulatory Visit: Payer: Self-pay

## 2019-06-12 MED ORDER — MAGNESIUM OXIDE 400 MG PO TABS: 400.0000 mg | ORAL_TABLET | Freq: Every day | ORAL | 6 refills | Status: AC

## 2019-09-19 ENCOUNTER — Other Ambulatory Visit: Payer: Self-pay | Admitting: Physician Assistant

## 2019-11-04 ENCOUNTER — Other Ambulatory Visit: Payer: Self-pay | Admitting: Physician Assistant

## 2019-11-24 ENCOUNTER — Emergency Department (HOSPITAL_COMMUNITY): Payer: Medicare Other

## 2019-11-24 ENCOUNTER — Other Ambulatory Visit: Payer: Self-pay

## 2019-11-24 ENCOUNTER — Inpatient Hospital Stay (HOSPITAL_COMMUNITY)
Admission: EM | Admit: 2019-11-24 | Discharge: 2019-12-12 | DRG: 871 | Disposition: E | Payer: Medicare Other | Attending: Internal Medicine | Admitting: Internal Medicine

## 2019-11-24 DIAGNOSIS — I130 Hypertensive heart and chronic kidney disease with heart failure and stage 1 through stage 4 chronic kidney disease, or unspecified chronic kidney disease: Secondary | ICD-10-CM | POA: Diagnosis present

## 2019-11-24 DIAGNOSIS — R001 Bradycardia, unspecified: Secondary | ICD-10-CM | POA: Diagnosis present

## 2019-11-24 DIAGNOSIS — I255 Ischemic cardiomyopathy: Secondary | ICD-10-CM | POA: Diagnosis present

## 2019-11-24 DIAGNOSIS — Z955 Presence of coronary angioplasty implant and graft: Secondary | ICD-10-CM

## 2019-11-24 DIAGNOSIS — I4891 Unspecified atrial fibrillation: Secondary | ICD-10-CM | POA: Diagnosis not present

## 2019-11-24 DIAGNOSIS — E874 Mixed disorder of acid-base balance: Secondary | ICD-10-CM | POA: Diagnosis present

## 2019-11-24 DIAGNOSIS — A4159 Other Gram-negative sepsis: Secondary | ICD-10-CM | POA: Diagnosis not present

## 2019-11-24 DIAGNOSIS — Z9049 Acquired absence of other specified parts of digestive tract: Secondary | ICD-10-CM

## 2019-11-24 DIAGNOSIS — E785 Hyperlipidemia, unspecified: Secondary | ICD-10-CM | POA: Diagnosis present

## 2019-11-24 DIAGNOSIS — Z452 Encounter for adjustment and management of vascular access device: Secondary | ICD-10-CM

## 2019-11-24 DIAGNOSIS — N170 Acute kidney failure with tubular necrosis: Secondary | ICD-10-CM | POA: Diagnosis present

## 2019-11-24 DIAGNOSIS — R531 Weakness: Secondary | ICD-10-CM | POA: Diagnosis not present

## 2019-11-24 DIAGNOSIS — E878 Other disorders of electrolyte and fluid balance, not elsewhere classified: Secondary | ICD-10-CM | POA: Diagnosis present

## 2019-11-24 DIAGNOSIS — J9602 Acute respiratory failure with hypercapnia: Secondary | ICD-10-CM | POA: Diagnosis not present

## 2019-11-24 DIAGNOSIS — Z66 Do not resuscitate: Secondary | ICD-10-CM | POA: Diagnosis not present

## 2019-11-24 DIAGNOSIS — I959 Hypotension, unspecified: Secondary | ICD-10-CM | POA: Diagnosis present

## 2019-11-24 DIAGNOSIS — Z87891 Personal history of nicotine dependence: Secondary | ICD-10-CM

## 2019-11-24 DIAGNOSIS — N390 Urinary tract infection, site not specified: Secondary | ICD-10-CM | POA: Diagnosis present

## 2019-11-24 DIAGNOSIS — E870 Hyperosmolality and hypernatremia: Secondary | ICD-10-CM | POA: Diagnosis not present

## 2019-11-24 DIAGNOSIS — Z20822 Contact with and (suspected) exposure to covid-19: Secondary | ICD-10-CM | POA: Diagnosis not present

## 2019-11-24 DIAGNOSIS — A419 Sepsis, unspecified organism: Secondary | ICD-10-CM

## 2019-11-24 DIAGNOSIS — Z7902 Long term (current) use of antithrombotics/antiplatelets: Secondary | ICD-10-CM

## 2019-11-24 DIAGNOSIS — R630 Anorexia: Secondary | ICD-10-CM | POA: Diagnosis present

## 2019-11-24 DIAGNOSIS — G92 Toxic encephalopathy: Secondary | ICD-10-CM | POA: Diagnosis present

## 2019-11-24 DIAGNOSIS — Z7984 Long term (current) use of oral hypoglycemic drugs: Secondary | ICD-10-CM

## 2019-11-24 DIAGNOSIS — I5043 Acute on chronic combined systolic (congestive) and diastolic (congestive) heart failure: Secondary | ICD-10-CM | POA: Diagnosis present

## 2019-11-24 DIAGNOSIS — F05 Delirium due to known physiological condition: Secondary | ICD-10-CM | POA: Diagnosis not present

## 2019-11-24 DIAGNOSIS — N179 Acute kidney failure, unspecified: Secondary | ICD-10-CM

## 2019-11-24 DIAGNOSIS — R680 Hypothermia, not associated with low environmental temperature: Secondary | ICD-10-CM | POA: Diagnosis present

## 2019-11-24 DIAGNOSIS — E1165 Type 2 diabetes mellitus with hyperglycemia: Secondary | ICD-10-CM | POA: Diagnosis not present

## 2019-11-24 DIAGNOSIS — Z7982 Long term (current) use of aspirin: Secondary | ICD-10-CM

## 2019-11-24 DIAGNOSIS — Z515 Encounter for palliative care: Secondary | ICD-10-CM | POA: Diagnosis not present

## 2019-11-24 DIAGNOSIS — J9601 Acute respiratory failure with hypoxia: Secondary | ICD-10-CM | POA: Diagnosis not present

## 2019-11-24 DIAGNOSIS — Z8719 Personal history of other diseases of the digestive system: Secondary | ICD-10-CM

## 2019-11-24 DIAGNOSIS — D631 Anemia in chronic kidney disease: Secondary | ICD-10-CM | POA: Diagnosis present

## 2019-11-24 DIAGNOSIS — E663 Overweight: Secondary | ICD-10-CM | POA: Diagnosis present

## 2019-11-24 DIAGNOSIS — R6521 Severe sepsis with septic shock: Secondary | ICD-10-CM | POA: Diagnosis present

## 2019-11-24 DIAGNOSIS — Z8249 Family history of ischemic heart disease and other diseases of the circulatory system: Secondary | ICD-10-CM

## 2019-11-24 DIAGNOSIS — R471 Dysarthria and anarthria: Secondary | ICD-10-CM | POA: Diagnosis present

## 2019-11-24 DIAGNOSIS — L89151 Pressure ulcer of sacral region, stage 1: Secondary | ICD-10-CM | POA: Diagnosis present

## 2019-11-24 DIAGNOSIS — J9690 Respiratory failure, unspecified, unspecified whether with hypoxia or hypercapnia: Secondary | ICD-10-CM

## 2019-11-24 DIAGNOSIS — N183 Chronic kidney disease, stage 3 unspecified: Secondary | ICD-10-CM | POA: Diagnosis present

## 2019-11-24 DIAGNOSIS — J150 Pneumonia due to Klebsiella pneumoniae: Secondary | ICD-10-CM | POA: Diagnosis present

## 2019-11-24 DIAGNOSIS — R21 Rash and other nonspecific skin eruption: Secondary | ICD-10-CM | POA: Diagnosis present

## 2019-11-24 DIAGNOSIS — E11649 Type 2 diabetes mellitus with hypoglycemia without coma: Secondary | ICD-10-CM | POA: Diagnosis not present

## 2019-11-24 DIAGNOSIS — I700 Atherosclerosis of aorta: Secondary | ICD-10-CM | POA: Diagnosis not present

## 2019-11-24 DIAGNOSIS — L8990 Pressure ulcer of unspecified site, unspecified stage: Secondary | ICD-10-CM | POA: Insufficient documentation

## 2019-11-24 DIAGNOSIS — E875 Hyperkalemia: Secondary | ICD-10-CM | POA: Diagnosis present

## 2019-11-24 DIAGNOSIS — Z833 Family history of diabetes mellitus: Secondary | ICD-10-CM

## 2019-11-24 DIAGNOSIS — Z79899 Other long term (current) drug therapy: Secondary | ICD-10-CM

## 2019-11-24 DIAGNOSIS — Z6838 Body mass index (BMI) 38.0-38.9, adult: Secondary | ICD-10-CM

## 2019-11-24 DIAGNOSIS — R000 Tachycardia, unspecified: Secondary | ICD-10-CM | POA: Diagnosis not present

## 2019-11-24 DIAGNOSIS — R34 Anuria and oliguria: Secondary | ICD-10-CM | POA: Diagnosis not present

## 2019-11-24 DIAGNOSIS — I2510 Atherosclerotic heart disease of native coronary artery without angina pectoris: Secondary | ICD-10-CM | POA: Diagnosis present

## 2019-11-24 DIAGNOSIS — D696 Thrombocytopenia, unspecified: Secondary | ICD-10-CM | POA: Diagnosis not present

## 2019-11-24 LAB — I-STAT CHEM 8, ED
BUN: 100 mg/dL — ABNORMAL HIGH (ref 8–23)
Calcium, Ion: 0.94 mmol/L — ABNORMAL LOW (ref 1.15–1.40)
Chloride: 107 mmol/L (ref 98–111)
Creatinine, Ser: 4.1 mg/dL — ABNORMAL HIGH (ref 0.44–1.00)
Glucose, Bld: 105 mg/dL — ABNORMAL HIGH (ref 70–99)
HCT: 30 % — ABNORMAL LOW (ref 36.0–46.0)
Hemoglobin: 10.2 g/dL — ABNORMAL LOW (ref 12.0–15.0)
Potassium: 6 mmol/L — ABNORMAL HIGH (ref 3.5–5.1)
Sodium: 140 mmol/L (ref 135–145)
TCO2: 22 mmol/L (ref 22–32)

## 2019-11-24 LAB — SARS CORONAVIRUS 2 BY RT PCR (HOSPITAL ORDER, PERFORMED IN ~~LOC~~ HOSPITAL LAB): SARS Coronavirus 2: NEGATIVE

## 2019-11-24 LAB — CBC
HCT: 31 % — ABNORMAL LOW (ref 36.0–46.0)
Hemoglobin: 9.8 g/dL — ABNORMAL LOW (ref 12.0–15.0)
MCH: 32.8 pg (ref 26.0–34.0)
MCHC: 31.6 g/dL (ref 30.0–36.0)
MCV: 103.7 fL — ABNORMAL HIGH (ref 80.0–100.0)
Platelets: 131 10*3/uL — ABNORMAL LOW (ref 150–400)
RBC: 2.99 MIL/uL — ABNORMAL LOW (ref 3.87–5.11)
RDW: 15.6 % — ABNORMAL HIGH (ref 11.5–15.5)
WBC: 8.4 10*3/uL (ref 4.0–10.5)
nRBC: 0.4 % — ABNORMAL HIGH (ref 0.0–0.2)

## 2019-11-24 LAB — TYPE AND SCREEN
ABO/RH(D): O POS
Antibody Screen: NEGATIVE

## 2019-11-24 LAB — TROPONIN I (HIGH SENSITIVITY): Troponin I (High Sensitivity): 6 ng/L (ref <18)

## 2019-11-24 LAB — LACTIC ACID, PLASMA: Lactic Acid, Venous: 1.3 mmol/L (ref 0.5–1.9)

## 2019-11-24 LAB — POC OCCULT BLOOD, ED: Fecal Occult Bld: NEGATIVE

## 2019-11-24 LAB — CBG MONITORING, ED: Glucose-Capillary: 212 mg/dL — ABNORMAL HIGH (ref 70–99)

## 2019-11-24 MED ORDER — LACTATED RINGERS IV BOLUS
1000.0000 mL | Freq: Once | INTRAVENOUS | Status: AC
  Administered 2019-11-24: 1000 mL via INTRAVENOUS

## 2019-11-24 MED ORDER — ATROPINE SULFATE 1 MG/10ML IJ SOSY
0.5000 mg | PREFILLED_SYRINGE | Freq: Once | INTRAMUSCULAR | Status: AC
  Administered 2019-11-24: 0.5 mg via INTRAVENOUS

## 2019-11-24 MED ORDER — DEXTROSE 50 % IV SOLN
1.0000 | Freq: Once | INTRAVENOUS | Status: AC
  Administered 2019-11-24: 50 mL via INTRAVENOUS
  Filled 2019-11-24: qty 50

## 2019-11-24 MED ORDER — LACTATED RINGERS IV BOLUS
500.0000 mL | Freq: Once | INTRAVENOUS | Status: AC
  Administered 2019-11-24: 500 mL via INTRAVENOUS

## 2019-11-24 MED ORDER — INSULIN ASPART 100 UNIT/ML IV SOLN
5.0000 [IU] | Freq: Once | INTRAVENOUS | Status: AC
  Administered 2019-11-24: 5 [IU] via INTRAVENOUS

## 2019-11-24 MED ORDER — CALCIUM GLUCONATE 10 % IV SOLN
1.0000 g | Freq: Once | INTRAVENOUS | Status: AC
  Administered 2019-11-24: 1 g via INTRAVENOUS
  Filled 2019-11-24: qty 10

## 2019-11-24 MED ORDER — NOREPINEPHRINE 4 MG/250ML-% IV SOLN
0.0000 ug/min | INTRAVENOUS | Status: DC
  Filled 2019-11-24: qty 250

## 2019-11-24 MED ORDER — EPINEPHRINE HCL 5 MG/250ML IV SOLN IN NS
0.5000 ug/min | INTRAVENOUS | Status: DC
  Administered 2019-11-25: 13 ug/min via INTRAVENOUS
  Administered 2019-11-25: 14 ug/min via INTRAVENOUS
  Administered 2019-11-25: 0.5 ug/min via INTRAVENOUS
  Administered 2019-11-26 (×2): 7 ug/min via INTRAVENOUS
  Administered 2019-11-27: 4 ug/min via INTRAVENOUS
  Administered 2019-11-27: 8 ug/min via INTRAVENOUS
  Administered 2019-11-28: 10 ug/min via INTRAVENOUS
  Filled 2019-11-24 (×10): qty 250

## 2019-11-24 MED ORDER — ATROPINE SULFATE 1 MG/10ML IJ SOSY
PREFILLED_SYRINGE | INTRAMUSCULAR | Status: AC
  Administered 2019-11-24: 0.5 mg via INTRAVENOUS
  Filled 2019-11-24: qty 10

## 2019-11-24 MED ORDER — VANCOMYCIN HCL IN DEXTROSE 1-5 GM/200ML-% IV SOLN
1000.0000 mg | Freq: Once | INTRAVENOUS | Status: AC
  Administered 2019-11-24: 1000 mg via INTRAVENOUS
  Filled 2019-11-24: qty 200

## 2019-11-24 MED ORDER — ATROPINE SULFATE 1 MG/10ML IJ SOSY: 0.5000 mg | PREFILLED_SYRINGE | Freq: Once | INTRAMUSCULAR | Status: AC

## 2019-11-24 MED ORDER — PIPERACILLIN-TAZOBACTAM 3.375 G IVPB 30 MIN
3.3750 g | Freq: Once | INTRAVENOUS | Status: AC
  Administered 2019-11-24: 3.375 g via INTRAVENOUS
  Filled 2019-11-24: qty 50

## 2019-11-24 NOTE — ED Provider Notes (Signed)
[facility] EMERGENCY DEPARTMENT Provider Note   CSN: [id number] Arrival date & time: 11/25/2019  2055     History Chief Complaint  Patient presents with  . Fatigue  . Hypotension    Karen Cooper is a 74 y.o. female.  The history is provided by the patient and the EMS personnel.  Weakness Severity:  Severe Onset quality:  Gradual Duration:  2 days Timing:  Constant Progression:  Worsening Chronicity:  New Relieved by:  Nothing Worsened by:  Nothing Ineffective treatments:  None tried Associated symptoms: no abdominal pain, no arthralgias, no chest pain, no cough, no dysuria, no fever, no seizures, no shortness of breath and no vomiting   Risk factors: congestive heart failure and heart disease        Past Medical History:  Diagnosis Date  . Coronary artery disease   . Hypertension   . Symptomatic anemia 04/2018  . Type II diabetes mellitus [facility])     Patient Active Problem List   Diagnosis Date Noted  . Hypotension 11/25/2019  . Pressure injury of skin 11/25/2019  . AKI (acute kidney injury) ([place])   . Bradycardia   . Septic shock ([place])   . Symptomatic anemia 04/26/2018  . Chronic systolic CHF (congestive heart failure) ([place]) 04/26/2018  . Acute GI bleeding 02/21/2018  . Essential hypertension 01/19/2018  . Dyslipidemia 01/19/2018  . Tobacco abuse 01/19/2018  . Ischemic cardiomyopathy 01/19/2018  . CKD (chronic kidney disease) stage 3, GFR 30-59 ml/min 01/19/2018  . Diabetes type 2, controlled ([place]) 01/19/2018  . CAD (coronary artery disease), native coronary artery 01/18/2018  . Abnormal nuclear stress test     Past Surgical History:  Procedure Laterality Date  . BACK SURGERY    . CHOLECYSTECTOMY OPEN  1972  . COLONOSCOPY WITH PROPOFOL N/A 04/28/2018   Procedure: COLONOSCOPY WITH PROPOFOL;  Surgeon: Carol Ada, MD;  Location: [place];  Service: Endoscopy;  Laterality: N/A;  . CORONARY ANGIOPLASTY WITH STENT PLACEMENT   01/18/2018  . CORONARY STENT INTERVENTION N/A 01/18/2018   Procedure: CORONARY STENT INTERVENTION;  Surgeon: Troy Sine, MD;  Location: [place] [facility];  Service: Cardiovascular;  Laterality: N/A;  . ESOPHAGOGASTRODUODENOSCOPY  02/22/2018  . ESOPHAGOGASTRODUODENOSCOPY (EGD) WITH PROPOFOL N/A 02/22/2018   Procedure: ESOPHAGOGASTRODUODENOSCOPY (EGD) WITH PROPOFOL;  Surgeon: Wilford Corner, MD;  Location: [place];  Service: Endoscopy;  Laterality: N/A;  . INCISION AND DRAINAGE  2004   "infection had settled in my back"  . LEFT HEART CATH AND CORONARY ANGIOGRAPHY N/A 01/18/2018   Procedure: LEFT HEART CATH AND CORONARY ANGIOGRAPHY;  Surgeon: Troy Sine, MD;  Location: [place] [facility];  Service: Cardiovascular;  Laterality: N/A;  . [place]   ruptured disk  . POLYPECTOMY  04/28/2018   Procedure: POLYPECTOMY;  Surgeon: Carol Ada, MD;  Location: [facility] ENDOSCOPY;  Service: Endoscopy;;  . TONSILLECTOMY       OB History   No obstetric history on file.     Family History  Problem Relation Age of Onset  . Dementia Mother   . Heart failure Mother   . Heart disease Father   . Heart attack Brother   . Diabetes Brother   . Heart attack Brother   . Cancer Sister     Social History   Tobacco Use  . Smoking status: Former Smoker    Packs/day: 1.00    Years: 40.00    Pack years: 40.00    Types: Cigarettes  Quit date: 04/03/2017    Years since quitting: 2.6  . Smokeless tobacco: Never Used  Vaping Use  . Vaping Use: Never used  Substance Use Topics  . Alcohol use: Never  . Drug use: Never    Home Medications Prior to Admission medications   Medication Sig Start Date End Date Taking? Authorizing Provider  aspirin 81 MG chewable tablet Chew 1 tablet (81 mg total) by mouth daily. 01/19/18  Yes Kroeger, Daleen Snook M., PA-C  atorvastatin (LIPITOR) 20 MG tablet Take 1 tablet (20 mg total) by mouth daily. 06/11/19 11/25/19 Yes Croitoru, Mihai, MD    carvedilol (COREG) 12.5 MG tablet TAKE 1 TABLET(12.5 MG) BY MOUTH TWICE DAILY Patient taking differently: Take 12.5 mg by mouth 2 (two) times daily with a meal.  05/03/19  Yes Almyra Deforest, PA  Cholecalciferol (VITAMIN D3) 1000 units CAPS Take 2,000 Units by mouth daily.    Yes [provider]  clopidogrel (PLAVIX) 75 MG tablet TAKE 1 TABLET(75 MG) BY MOUTH DAILY Patient taking differently: Take 75 mg by mouth daily.  11/05/19  Yes Croitoru, Mihai, MD  ferrous sulfate 325 (65 FE) MG tablet Take 1 tablet (325 mg total) by mouth 2 (two) times daily with a meal. 04/28/18  Yes Ghimire, Henreitta Leber, MD  furosemide (LASIX) 40 MG tablet Take 40 mg 4 times a week ( Tue,Thurs,Sat,Sun ) Patient taking differently: Take 40 mg by mouth See admin instructions. Take 40 mg 4 times a week ( Tue,Thurs,Sat,Sun ) 06/11/19  Yes Croitoru, Mihai, MD  magnesium oxide (MAG-OX) 400 MG tablet Take 1 tablet (400 mg total) by mouth daily. 06/12/19  Yes Croitoru, Mihai, MD  metFORMIN (GLUCOPHAGE-XR) 750 MG 24 hr tablet Take 1,500 mg by mouth daily with supper.  12/05/17  Yes [provider]  pantoprazole (PROTONIX) 40 MG tablet TAKE 1 TABLET(40 MG) BY MOUTH DAILY Patient taking differently: Take 40 mg by mouth daily.  09/20/19  Yes Croitoru, Mihai, MD  sacubitril-valsartan (ENTRESTO) 97-103 MG Take 1 tablet by mouth 2 (two) times daily. 07/19/18  Yes Almyra Deforest, PA  nitroGLYCERIN (NITROSTAT) 0.4 MG SL tablet Place 1 tablet (0.4 mg total) under the tongue every 5 (five) minutes as needed for chest pain. 01/19/18 01/19/19  Kroeger, Lorelee Cover., PA-C  potassium chloride (K-DUR) 10 MEQ tablet Take 1 tablet (10 mEq total) by mouth daily. Patient not taking: Reported on 11/25/2019 06/16/18 11/25/19  Croitoru, Dani Gobble, MD    Allergies    Patient has no known allergies.  Review of Systems   Review of Systems  Constitutional: Negative for chills and fever.  HENT: Negative for ear pain and sore throat.   Eyes: Negative for pain and  visual disturbance.  Respiratory: Negative for cough and shortness of breath.   Cardiovascular: Negative for chest pain and palpitations.  Gastrointestinal: Negative for abdominal pain and vomiting.  Genitourinary: Negative for dysuria and hematuria.  Musculoskeletal: Negative for arthralgias and back pain.  Skin: Negative for color change and rash.  Neurological: Positive for weakness. Negative for seizures and syncope.  All other systems reviewed and are negative.   Physical Exam Updated Vital Signs BP (!) 125/54   Pulse 70   Temp 99.1 F (37.3 C)   Resp (!) 33   Ht 5\' 4"  (1.626 m)   Wt 93.6 kg   SpO2 99%   BMI 35.42 kg/m   Physical Exam Vitals and nursing note reviewed.  Constitutional:      General: She is not in acute distress.  Appearance: She is well-developed.     Comments: Appears uncomfortable.  Slow to respond to questions and intermittently falling asleep.  HENT:     Head: Normocephalic and atraumatic.  Eyes:     Conjunctiva/sclera: Conjunctivae normal.  Cardiovascular:     Rate and Rhythm: Regular rhythm. Bradycardia present.     Heart sounds: No murmur heard.   Pulmonary:     Effort: Pulmonary effort is normal. No respiratory distress.     Breath sounds: Normal breath sounds.  Abdominal:     Palpations: Abdomen is soft.     Tenderness: There is no abdominal tenderness. There is no guarding or rebound.  Musculoskeletal:     Cervical back: Neck supple.  Skin:    General: Skin is dry.     Findings: Rash (Erythema to bl inguinal folds.) present.     Comments: Cold to the touch.  Neurological:     General: No focal deficit present.     Mental Status: She is alert and oriented to person, place, and time. Mental status is at baseline.     Sensory: No sensory deficit.     Motor: No weakness.     ED Results / Procedures / Treatments   Labs (all labs ordered are listed, but only abnormal results are displayed) Labs Reviewed  CBC - Abnormal; Notable  for the following components:      Result Value   RBC 2.99 (*)    Hemoglobin 9.8 (*)    HCT 31.0 (*)    MCV 103.7 (*)    RDW 15.6 (*)    Platelets 131 (*)    nRBC 0.4 (*)    All other components within normal limits  URINALYSIS, ROUTINE W REFLEX MICROSCOPIC - Abnormal; Notable for the following components:   Color, Urine AMBER (*)    APPearance TURBID (*)    Hgb urine dipstick MODERATE (*)    Protein, ur 100 (*)    Leukocytes,Ua MODERATE (*)    WBC, UA >50 (*)    Bacteria, UA MANY (*)    Non Squamous Epithelial 0-5 (*)    All other components within normal limits  LACTIC ACID, PLASMA - Abnormal; Notable for the following components:   Lactic Acid, Venous 4.2 (*)    All other components within normal limits  MAGNESIUM - Abnormal; Notable for the following components:   Magnesium 1.0 (*)    All other components within normal limits  COMPREHENSIVE METABOLIC PANEL - Abnormal; Notable for the following components:   CO2 20 (*)    Glucose, Bld 184 (*)    BUN 81 (*)    Creatinine, Ser 4.10 (*)    Calcium 7.5 (*)    Total Protein 4.6 (*)    Albumin 2.2 (*)    GFR calc non Af Amer 10 (*)    GFR calc Af Amer 12 (*)    All other components within normal limits  CBC - Abnormal; Notable for the following components:   WBC 11.5 (*)    RBC 2.97 (*)    Hemoglobin 9.6 (*)    HCT 30.9 (*)    MCV 104.0 (*)    RDW 15.7 (*)    Platelets 141 (*)    nRBC 0.4 (*)    All other components within normal limits  CREATININE, SERUM - Abnormal; Notable for the following components:   Creatinine, Ser 4.15 (*)    GFR calc non Af Amer 10 (*)    GFR calc Af Wyvonnia Lora  11 (*)    All other components within normal limits  CBC - Abnormal; Notable for the following components:   WBC 18.4 (*)    RBC 2.89 (*)    Hemoglobin 9.3 (*)    HCT 29.9 (*)    MCV 103.5 (*)    nRBC 0.7 (*)    All other components within normal limits  BASIC METABOLIC PANEL - Abnormal; Notable for the following components:    Potassium 5.3 (*)    CO2 19 (*)    Glucose, Bld 167 (*)    BUN 79 (*)    Creatinine, Ser 4.01 (*)    Calcium 7.8 (*)    GFR calc non Af Amer 10 (*)    GFR calc Af Amer 12 (*)    All other components within normal limits  MAGNESIUM - Abnormal; Notable for the following components:   Magnesium 1.5 (*)    All other components within normal limits  PHOSPHORUS - Abnormal; Notable for the following components:   Phosphorus 5.5 (*)    All other components within normal limits  HEMOGLOBIN A1C - Abnormal; Notable for the following components:   Hgb A1c MFr Bld 6.3 (*)    All other components within normal limits  PROTIME-INR - Abnormal; Notable for the following components:   INR 1.3 (*)    All other components within normal limits  APTT - Abnormal; Notable for the following components:   aPTT 40 (*)    All other components within normal limits  GLUCOSE, CAPILLARY - Abnormal; Notable for the following components:   Glucose-Capillary 157 (*)    All other components within normal limits  GLUCOSE, CAPILLARY - Abnormal; Notable for the following components:   Glucose-Capillary 142 (*)    All other components within normal limits  GLUCOSE, CAPILLARY - Abnormal; Notable for the following components:   Glucose-Capillary 40 (*)    All other components within normal limits  GLUCOSE, CAPILLARY - Abnormal; Notable for the following components:   Glucose-Capillary 116 (*)    All other components within normal limits  I-STAT CHEM 8, ED - Abnormal; Notable for the following components:   Potassium 6.0 (*)    BUN 100 (*)    Creatinine, Ser 4.10 (*)    Glucose, Bld 105 (*)    Calcium, Ion 0.94 (*)    Hemoglobin 10.2 (*)    HCT 30.0 (*)    All other components within normal limits  CBG MONITORING, ED - Abnormal; Notable for the following components:   Glucose-Capillary 212 (*)    All other components within normal limits  I-STAT VENOUS BLOOD GAS, ED - Abnormal; Notable for the following  components:   pCO2, Ven 35.2 (*)    pO2, Ven 114.0 (*)    Bicarbonate 16.8 (*)    TCO2 18 (*)    Acid-base deficit 9.0 (*)    Calcium, Ion 0.97 (*)    HCT 26.0 (*)    Hemoglobin 8.8 (*)    All other components within normal limits  CULTURE, BLOOD (ROUTINE X 2)  CULTURE, BLOOD (ROUTINE X 2)  SARS CORONAVIRUS 2 BY RT PCR (HOSPITAL ORDER, [place])  MRSA PCR SCREENING  URINE CULTURE  LACTIC ACID, PLASMA  TSH  T4, FREE  CK  LACTIC ACID, PLASMA  BLOOD GAS, ARTERIAL  POC OCCULT BLOOD, ED  TYPE AND SCREEN  TROPONIN I (HIGH SENSITIVITY)    EKG EKG Interpretation  Date/Time:  Saturday November 24 2019  21:18:13 EDT Ventricular Rate:  137 PR Interval:    QRS Duration: 133 QT Interval:  416 QTC Calculation: 593 R Axis:   0 Text Interpretation: Atrial fibrillation Ventricular tachycardia, unsustained Nonspecific intraventricular conduction delay Repol abnrm suggests ischemia, diffuse leads ST elevation, consider inferior injury rate slower but morphology similar to previous Confirmed by Theotis Burrow [phone]) on 12/02/2019 9:48:37 PM   Radiology DG Chest 1 View  Result Date: 11/25/2019 CLINICAL DATA:  Initial evaluation for possible pneumonia. EXAM: CHEST  1 VIEW COMPARISON:  Prior radiograph from 04/26/2018. FINDINGS: Defibrillator pad overlies the left chest. Cardiac and mediastinal silhouettes are stable, and remain within normal limits. Aortic atherosclerosis. Lungs mildly hypoinflated. No focal infiltrates or consolidative opacity. No edema or effusion. No pneumothorax. No acute osseous finding. IMPRESSION: 1. No radiographic evidence for active cardiopulmonary disease. 2.  Aortic Atherosclerosis (ICD10-I70.0). Electronically Signed   By: Jeannine Boga M.D.   On: 11/25/2019 00:27   US RENAL  Result Date: 11/25/2019 CLINICAL DATA:  Acute renal insufficiency. EXAM: RENAL / URINARY TRACT ULTRASOUND COMPLETE COMPARISON:  None. FINDINGS: Right Kidney:  Renal measurements: 9.4 cm x 4.7 cm x 4.0 cm = volume: 91.4 mL. There is diffusely increased echogenicity of the renal parenchyma. No mass or hydronephrosis visualized. Left Kidney: Renal measurements: 9.7 cm x 5.3 cm x 5.1 cm = volume: 136.5 mL. There is diffusely increased echogenicity of the renal parenchyma. A 2.1 cm x 1.9 cm x 1.8 cm well-defined hypoechoic area is seen within the mid left kidney. No hydronephrosis visualized. Bladder: A Foley catheter is seen within the urinary bladder. Other: None. IMPRESSION: 1. Increased echogenicity of the renal parenchyma which is likely secondary to medical renal disease. 2. Left renal cyst. Electronically Signed   By: Virgina Norfolk M.D.   On: 11/25/2019 16:35   DG CHEST PORT 1 VIEW  Result Date: 11/25/2019 CLINICAL DATA:  Check central line placement EXAM: PORTABLE CHEST 1 VIEW COMPARISON:  Film from earlier in the same day. FINDINGS: Cardiac shadow is stable. Right jugular central line is noted with the catheter tip at the superior aspect of the right atrium. No pneumothorax is seen. Small left pleural effusion is noted. No other focal abnormality is seen. IMPRESSION: No pneumothorax following central line placement. Electronically Signed   By: Inez Catalina M.D.   On: 11/25/2019 07:18   CT RENAL STONE STUDY  Result Date: 11/25/2019 CLINICAL DATA:  Acute renal failure EXAM: CT ABDOMEN AND PELVIS WITHOUT CONTRAST TECHNIQUE: Multidetector CT imaging of the abdomen and pelvis was performed following the standard protocol without IV contrast. COMPARISON:  None. FINDINGS: Lower chest: Lung bases demonstrate small pleural effusions bilaterally. No focal infiltrate is seen. Coronary calcifications are noted. Hepatobiliary: No focal liver abnormality is seen. Status post cholecystectomy. No biliary dilatation. Pancreas: Unremarkable. No pancreatic ductal dilatation or surrounding inflammatory changes. Spleen: Normal in size without focal abnormality.  Adrenals/Urinary Tract: Adrenal glands are unremarkable. Kidneys are normal, without renal calculi, focal lesion, or hydronephrosis. Bladder is unremarkable. Kidneys demonstrate a normal appearance without renal calculi or obstructive changes. Renal vascular calcifications are noted. A cystic lesion is noted in the left kidney measuring 3.2 cm. This is incompletely evaluated on this exam. A smaller exophytic cyst is noted inferiorly measuring 1.5 cm. The bladder is decompressed. Stomach/Bowel: Scattered diverticular change of the colon is noted without evidence of diverticulitis. No obstructive or inflammatory changes are seen. The appendix is within normal limits. Small bowel and stomach are unremarkable. Vascular/Lymphatic: Aortic atherosclerosis. No  enlarged abdominal or pelvic lymph nodes. Reproductive: Uterus is well visualized. Mild fluid attenuation is noted centrally within the endometrial canal slightly prominent given the patient's age. A hypodense lesion is noted involving the left ovary suspicious for simple cyst. Other: Minimal free fluid is noted likely physiologic in nature. Musculoskeletal: Degenerative changes of the lumbar spine are noted. IMPRESSION: Cystic lesions within the left kidney without obstructive change. These are likely simple in nature although incompletely evaluated on this exam. Mild fluid attenuation within the endometrial canal although slightly more prominent than that expected given the patient's age. Left ovarian cystic lesion is noted as well. Nonemergent ultrasound can be performed as clinically indicated for further evaluation Electronically Signed   By: Inez Catalina M.D.   On: 11/25/2019 04:36    Procedures Procedures (including critical care time)  Medications Ordered in ED Medications  EPINEPHrine (ADRENALIN) 4 mg in NS 250 mL (0.016 mg/mL) premix infusion (14 mcg/min Intravenous New Bag/Given 11/25/19 1309)  aspirin chewable tablet 81 mg (81 mg Oral Not Given  11/25/19 0933)  pantoprazole (PROTONIX) EC tablet 40 mg (40 mg Oral Not Given 11/25/19 0934)  clopidogrel (PLAVIX) tablet 75 mg (75 mg Oral Not Given 11/25/19 0933)  docusate sodium (COLACE) capsule 100 mg (has no administration in time range)  polyethylene glycol (MIRALAX / GLYCOLAX) packet 17 g (has no administration in time range)  enoxaparin (LOVENOX) injection 30 mg (30 mg Subcutaneous Given 11/25/19 1146)  0.9 %  sodium chloride infusion (has no administration in time range)  Chlorhexidine Gluconate Cloth 2 % PADS 6 each (6 each Topical Given 11/25/19 1146)  insulin aspart (novoLOG) injection 0-15 Units (0 Units Subcutaneous Not Given 11/25/19 1521)  hydrocortisone sodium succinate (SOLU-CORTEF) 100 MG injection 50 mg (50 mg Intravenous Given 11/25/19 1141)  vancomycin variable dose per unstable renal function (pharmacist dosing) (has no administration in time range)  piperacillin-tazobactam (ZOSYN) IVPB 3.375 g (3.375 g Intravenous New Bag/Given 11/25/19 1138)  LORazepam (ATIVAN) injection 0.5 mg (0.5 mg Intravenous Not Given 11/25/19 1537)  dextrose 5 % in lactated ringers infusion (has no administration in time range)  lactated ringers bolus 1,000 mL (0 mLs Intravenous Stopped 11/15/2019 2220)  atropine 1 MG/10ML injection 0.5 mg (0.5 mg Intravenous Given 11/15/2019 2128)  atropine 1 MG/10ML injection 0.5 mg (0.5 mg Intravenous Given 12/03/2019 2206)  atropine 1 MG/10ML injection 0.5 mg (0.5 mg Intravenous Given 12/07/2019 2113)  vancomycin (VANCOCIN) IVPB 1000 mg/200 mL premix (0 mg Intravenous Stopped 11/15/2019 2358)  piperacillin-tazobactam (ZOSYN) IVPB 3.375 g (0 g Intravenous Stopped 11/25/2019 2348)  calcium gluconate inj 10% (1 g) URGENT USE ONLY! (1 g Intravenous Given 12/07/2019 2242)  insulin aspart (novoLOG) injection 5 Units (5 Units Intravenous Given 12/03/2019 2245)    And  dextrose 50 % solution 50 mL (50 mLs Intravenous Given 11/20/2019 2246)  lactated ringers bolus 500 mL (0 mLs Intravenous Stopped  11/25/19 0044)  lactated ringers bolus 1,500 mL ( Intravenous Rate/Dose Verify 11/25/19 0400)  magnesium sulfate IVPB 2 g 50 mL ( Intravenous Rate/Dose Verify 11/25/19 0400)  lactated ringers bolus 500 mL (500 mLs Intravenous New Bag/Given 11/25/19 1525)  dextrose 50 % solution 50 mL (50 mLs Intravenous Given 11/25/19 1524)    ED Course  I have reviewed the triage vital signs and the nursing notes.  Pertinent labs & imaging results that were available during my care of the patient were reviewed by me and considered in my medical decision making (see chart for details).  MDM Rules/Calculators/A&P                          74 year old female with history of coronary artery disease, stage III CKD, diabetes presents with weakness.  Per EMS patient has been weak for the past few days that worsened today.  Patient has been falling asleep more with family which prompted the family to call EMS.  EMS reports that during transfer patient repeatedly fell asleep and was bradycardic into the 30s as well has difficult to measure blood pressure with a systolic blood pressure in the 60s.  EMS unable to obtain temperature.  Probably from EKG showed slow A. fib.  Received 500 cc of fluid with EMS.  Upon arrival to the emergency department patient was somnolent however was arousable.  Heart rate in the 30s with a palpable blood pressure in the 50s.  Glucose 117.  Patient was placed on the pads and given half milligram atropine with mild improvement of her heart rate into the 50s and low 60s.  Patient received a total of 1.5mg   atropine with continued bradycardia into the 40s.  Rectal temperature measured 86 F and patient was placed on Bair hugger as well as given IV fluids.  Received approximately 30 cc/kg while in the emergency department was started on Vanc as well as Zosyn for presumed sepsis.  Labs notable for lactate 1.3.  Cr 4.1.  Covid negative.  Blood cultures and urine culture sent.  Unclear etiology to  patient's bradycardia though may be secondary to hypothermia.  At this time leading differential is for sepsis given hypotension, hypothermia, altered mental status.  Cannot rule out myxedema coma given bradycardia, hypotension, low temperature.  TSH and T4 sent.  My attending spoke with patient's family on the phone regarding her condition and they endorsed that patient is currently full code and also states the patient has been slowly declining with her energy and fatigue over the past week.  On repeat evaluation patient continued to have low blood pressures in the 80s with a map below 65.  Given low maps as well as bradycardia patient was started on peripheral epinephrine for pressure support.  ICU was consulted for admission.  ICU evaluated the patient at bedside and agreed with admission to their service.  At time of signout patient is awaiting transfer to the ICU is currently in stable condition.  Patient's mentation has improved though is still hypothermic and will require further warming.  Patient signed out in stable condition without further events.  Final Clinical Impression(s) / ED Diagnoses Final diagnoses:  Septic shock ([place])  Bradycardia  AKI (acute kidney injury) [facility])    Rx / [place] Orders ED Discharge Orders    None       Silvestre Gunner, MD 11/25/19 Winn, Wenda Overland, MD 11/26/19 [id number]    Rex Kras Wenda Overland, MD 11/26/19 0930

## 2019-11-24 NOTE — ED Triage Notes (Signed)
Pt arrived via EMS from home reporting increased fatigue for the last week. Over the last 2 days pt has had a decrease in appetite and low oral intake. Per EMS pt was hypotensive on their arrival at 60 palp, cbg of 117.

## 2019-11-25 ENCOUNTER — Inpatient Hospital Stay (HOSPITAL_COMMUNITY): Payer: Medicare Other

## 2019-11-25 ENCOUNTER — Emergency Department (HOSPITAL_COMMUNITY): Payer: Medicare Other

## 2019-11-25 DIAGNOSIS — R34 Anuria and oliguria: Secondary | ICD-10-CM | POA: Diagnosis not present

## 2019-11-25 DIAGNOSIS — J90 Pleural effusion, not elsewhere classified: Secondary | ICD-10-CM | POA: Diagnosis not present

## 2019-11-25 DIAGNOSIS — I361 Nonrheumatic tricuspid (valve) insufficiency: Secondary | ICD-10-CM | POA: Diagnosis not present

## 2019-11-25 DIAGNOSIS — Z6838 Body mass index (BMI) 38.0-38.9, adult: Secondary | ICD-10-CM | POA: Diagnosis not present

## 2019-11-25 DIAGNOSIS — D696 Thrombocytopenia, unspecified: Secondary | ICD-10-CM | POA: Diagnosis not present

## 2019-11-25 DIAGNOSIS — A419 Sepsis, unspecified organism: Secondary | ICD-10-CM | POA: Diagnosis not present

## 2019-11-25 DIAGNOSIS — A4159 Other Gram-negative sepsis: Secondary | ICD-10-CM | POA: Diagnosis present

## 2019-11-25 DIAGNOSIS — N179 Acute kidney failure, unspecified: Secondary | ICD-10-CM

## 2019-11-25 DIAGNOSIS — I5043 Acute on chronic combined systolic (congestive) and diastolic (congestive) heart failure: Secondary | ICD-10-CM | POA: Diagnosis present

## 2019-11-25 DIAGNOSIS — E870 Hyperosmolality and hypernatremia: Secondary | ICD-10-CM | POA: Diagnosis not present

## 2019-11-25 DIAGNOSIS — G92 Toxic encephalopathy: Secondary | ICD-10-CM | POA: Diagnosis present

## 2019-11-25 DIAGNOSIS — N281 Cyst of kidney, acquired: Secondary | ICD-10-CM | POA: Diagnosis not present

## 2019-11-25 DIAGNOSIS — E11649 Type 2 diabetes mellitus with hypoglycemia without coma: Secondary | ICD-10-CM | POA: Diagnosis not present

## 2019-11-25 DIAGNOSIS — N390 Urinary tract infection, site not specified: Secondary | ICD-10-CM | POA: Diagnosis present

## 2019-11-25 DIAGNOSIS — J189 Pneumonia, unspecified organism: Secondary | ICD-10-CM | POA: Diagnosis not present

## 2019-11-25 DIAGNOSIS — N170 Acute kidney failure with tubular necrosis: Secondary | ICD-10-CM | POA: Diagnosis present

## 2019-11-25 DIAGNOSIS — L89151 Pressure ulcer of sacral region, stage 1: Secondary | ICD-10-CM | POA: Diagnosis present

## 2019-11-25 DIAGNOSIS — J9601 Acute respiratory failure with hypoxia: Secondary | ICD-10-CM | POA: Diagnosis not present

## 2019-11-25 DIAGNOSIS — J9602 Acute respiratory failure with hypercapnia: Secondary | ICD-10-CM | POA: Diagnosis not present

## 2019-11-25 DIAGNOSIS — L8990 Pressure ulcer of unspecified site, unspecified stage: Secondary | ICD-10-CM | POA: Insufficient documentation

## 2019-11-25 DIAGNOSIS — Z66 Do not resuscitate: Secondary | ICD-10-CM | POA: Diagnosis not present

## 2019-11-25 DIAGNOSIS — R6521 Severe sepsis with septic shock: Secondary | ICD-10-CM | POA: Diagnosis present

## 2019-11-25 DIAGNOSIS — I340 Nonrheumatic mitral (valve) insufficiency: Secondary | ICD-10-CM | POA: Diagnosis not present

## 2019-11-25 DIAGNOSIS — I959 Hypotension, unspecified: Secondary | ICD-10-CM | POA: Diagnosis not present

## 2019-11-25 DIAGNOSIS — J150 Pneumonia due to Klebsiella pneumoniae: Secondary | ICD-10-CM | POA: Diagnosis present

## 2019-11-25 DIAGNOSIS — N858 Other specified noninflammatory disorders of uterus: Secondary | ICD-10-CM | POA: Diagnosis not present

## 2019-11-25 DIAGNOSIS — I4891 Unspecified atrial fibrillation: Secondary | ICD-10-CM | POA: Diagnosis not present

## 2019-11-25 DIAGNOSIS — N185 Chronic kidney disease, stage 5: Secondary | ICD-10-CM | POA: Diagnosis not present

## 2019-11-25 DIAGNOSIS — E663 Overweight: Secondary | ICD-10-CM | POA: Diagnosis present

## 2019-11-25 DIAGNOSIS — Z515 Encounter for palliative care: Secondary | ICD-10-CM | POA: Diagnosis not present

## 2019-11-25 DIAGNOSIS — I9589 Other hypotension: Secondary | ICD-10-CM | POA: Diagnosis not present

## 2019-11-25 DIAGNOSIS — R001 Bradycardia, unspecified: Secondary | ICD-10-CM

## 2019-11-25 DIAGNOSIS — N183 Chronic kidney disease, stage 3 unspecified: Secondary | ICD-10-CM | POA: Diagnosis present

## 2019-11-25 DIAGNOSIS — I5082 Biventricular heart failure: Secondary | ICD-10-CM | POA: Diagnosis not present

## 2019-11-25 DIAGNOSIS — J9690 Respiratory failure, unspecified, unspecified whether with hypoxia or hypercapnia: Secondary | ICD-10-CM | POA: Diagnosis not present

## 2019-11-25 DIAGNOSIS — F05 Delirium due to known physiological condition: Secondary | ICD-10-CM | POA: Diagnosis not present

## 2019-11-25 DIAGNOSIS — I130 Hypertensive heart and chronic kidney disease with heart failure and stage 1 through stage 4 chronic kidney disease, or unspecified chronic kidney disease: Secondary | ICD-10-CM | POA: Diagnosis present

## 2019-11-25 DIAGNOSIS — E874 Mixed disorder of acid-base balance: Secondary | ICD-10-CM | POA: Diagnosis present

## 2019-11-25 DIAGNOSIS — N83202 Unspecified ovarian cyst, left side: Secondary | ICD-10-CM | POA: Diagnosis not present

## 2019-11-25 DIAGNOSIS — G9341 Metabolic encephalopathy: Secondary | ICD-10-CM | POA: Diagnosis not present

## 2019-11-25 DIAGNOSIS — I700 Atherosclerosis of aorta: Secondary | ICD-10-CM | POA: Diagnosis not present

## 2019-11-25 DIAGNOSIS — Z20822 Contact with and (suspected) exposure to covid-19: Secondary | ICD-10-CM | POA: Diagnosis present

## 2019-11-25 DIAGNOSIS — R0902 Hypoxemia: Secondary | ICD-10-CM | POA: Diagnosis not present

## 2019-11-25 DIAGNOSIS — E878 Other disorders of electrolyte and fluid balance, not elsewhere classified: Secondary | ICD-10-CM | POA: Diagnosis present

## 2019-11-25 DIAGNOSIS — N2889 Other specified disorders of kidney and ureter: Secondary | ICD-10-CM | POA: Diagnosis not present

## 2019-11-25 DIAGNOSIS — B9689 Other specified bacterial agents as the cause of diseases classified elsewhere: Secondary | ICD-10-CM | POA: Diagnosis not present

## 2019-11-25 LAB — MAGNESIUM
Magnesium: 1 mg/dL — ABNORMAL LOW (ref 1.7–2.4)
Magnesium: 1.5 mg/dL — ABNORMAL LOW (ref 1.7–2.4)

## 2019-11-25 LAB — HEMOGLOBIN A1C
Hgb A1c MFr Bld: 6.3 % — ABNORMAL HIGH (ref 4.8–5.6)
Mean Plasma Glucose: 134.11 mg/dL

## 2019-11-25 LAB — POCT I-STAT 7, (LYTES, BLD GAS, ICA,H+H)
Acid-base deficit: 5 mmol/L — ABNORMAL HIGH (ref 0.0–2.0)
Bicarbonate: 22.7 mmol/L (ref 20.0–28.0)
Calcium, Ion: 1.13 mmol/L — ABNORMAL LOW (ref 1.15–1.40)
HCT: 28 % — ABNORMAL LOW (ref 36.0–46.0)
Hemoglobin: 9.5 g/dL — ABNORMAL LOW (ref 12.0–15.0)
O2 Saturation: 94 %
Patient temperature: 99.1
Potassium: 5.3 mmol/L — ABNORMAL HIGH (ref 3.5–5.1)
Sodium: 142 mmol/L (ref 135–145)
TCO2: 24 mmol/L (ref 22–32)
pCO2 arterial: 57 mmHg — ABNORMAL HIGH (ref 32.0–48.0)
pH, Arterial: 7.209 — ABNORMAL LOW (ref 7.350–7.450)
pO2, Arterial: 87 mmHg (ref 83.0–108.0)

## 2019-11-25 LAB — CBC
HCT: 30.9 % — ABNORMAL LOW (ref 36.0–46.0)
HCT: 29.9 % — ABNORMAL LOW (ref 36.0–46.0)
Hemoglobin: 9.6 g/dL — ABNORMAL LOW (ref 12.0–15.0)
Hemoglobin: 9.3 g/dL — ABNORMAL LOW (ref 12.0–15.0)
MCH: 32.3 pg (ref 26.0–34.0)
MCH: 32.2 pg (ref 26.0–34.0)
MCHC: 31.1 g/dL (ref 30.0–36.0)
MCHC: 31.1 g/dL (ref 30.0–36.0)
MCV: 104 fL — ABNORMAL HIGH (ref 80.0–100.0)
MCV: 103.5 fL — ABNORMAL HIGH (ref 80.0–100.0)
Platelets: 141 10*3/uL — ABNORMAL LOW (ref 150–400)
Platelets: 168 10*3/uL (ref 150–400)
RBC: 2.97 MIL/uL — ABNORMAL LOW (ref 3.87–5.11)
RBC: 2.89 MIL/uL — ABNORMAL LOW (ref 3.87–5.11)
RDW: 15.7 % — ABNORMAL HIGH (ref 11.5–15.5)
RDW: 15.4 % (ref 11.5–15.5)
WBC: 11.5 10*3/uL — ABNORMAL HIGH (ref 4.0–10.5)
WBC: 18.4 10*3/uL — ABNORMAL HIGH (ref 4.0–10.5)
nRBC: 0.4 % — ABNORMAL HIGH (ref 0.0–0.2)
nRBC: 0.7 % — ABNORMAL HIGH (ref 0.0–0.2)

## 2019-11-25 LAB — I-STAT VENOUS BLOOD GAS, ED
Acid-base deficit: 9 mmol/L — ABNORMAL HIGH (ref 0.0–2.0)
Bicarbonate: 16.8 mmol/L — ABNORMAL LOW (ref 20.0–28.0)
Calcium, Ion: 0.97 mmol/L — ABNORMAL LOW (ref 1.15–1.40)
HCT: 26 % — ABNORMAL LOW (ref 36.0–46.0)
Hemoglobin: 8.8 g/dL — ABNORMAL LOW (ref 12.0–15.0)
O2 Saturation: 98 %
Potassium: 4.7 mmol/L (ref 3.5–5.1)
Sodium: 139 mmol/L (ref 135–145)
TCO2: 18 mmol/L — ABNORMAL LOW (ref 22–32)
pCO2, Ven: 35.2 mmHg — ABNORMAL LOW (ref 44.0–60.0)
pH, Ven: 7.287 (ref 7.250–7.430)
pO2, Ven: 114 mmHg — ABNORMAL HIGH (ref 32.0–45.0)

## 2019-11-25 LAB — GLUCOSE, CAPILLARY
Glucose-Capillary: 116 mg/dL — ABNORMAL HIGH (ref 70–99)
Glucose-Capillary: 132 mg/dL — ABNORMAL HIGH (ref 70–99)
Glucose-Capillary: 138 mg/dL — ABNORMAL HIGH (ref 70–99)
Glucose-Capillary: 142 mg/dL — ABNORMAL HIGH (ref 70–99)
Glucose-Capillary: 157 mg/dL — ABNORMAL HIGH (ref 70–99)
Glucose-Capillary: 40 mg/dL — CL (ref 70–99)

## 2019-11-25 LAB — BASIC METABOLIC PANEL
Anion gap: 15 (ref 5–15)
Anion gap: 14 (ref 5–15)
BUN: 79 mg/dL — ABNORMAL HIGH (ref 8–23)
BUN: 78 mg/dL — ABNORMAL HIGH (ref 8–23)
CO2: 19 mmol/L — ABNORMAL LOW (ref 22–32)
CO2: 19 mmol/L — ABNORMAL LOW (ref 22–32)
Calcium: 7.8 mg/dL — ABNORMAL LOW (ref 8.9–10.3)
Calcium: 7.5 mg/dL — ABNORMAL LOW (ref 8.9–10.3)
Chloride: 106 mmol/L (ref 98–111)
Chloride: 107 mmol/L (ref 98–111)
Creatinine, Ser: 4.01 mg/dL — ABNORMAL HIGH (ref 0.44–1.00)
Creatinine, Ser: 4.48 mg/dL — ABNORMAL HIGH (ref 0.44–1.00)
GFR calc Af Amer: 12 mL/min — ABNORMAL LOW (ref >60)
GFR calc Af Amer: 10 mL/min — ABNORMAL LOW (ref >60)
GFR calc non Af Amer: 10 mL/min — ABNORMAL LOW (ref >60)
GFR calc non Af Amer: 9 mL/min — ABNORMAL LOW (ref >60)
Glucose, Bld: 167 mg/dL — ABNORMAL HIGH (ref 70–99)
Glucose, Bld: 154 mg/dL — ABNORMAL HIGH (ref 70–99)
Potassium: 5.3 mmol/L — ABNORMAL HIGH (ref 3.5–5.1)
Potassium: 5.9 mmol/L — ABNORMAL HIGH (ref 3.5–5.1)
Sodium: 140 mmol/L (ref 135–145)
Sodium: 140 mmol/L (ref 135–145)

## 2019-11-25 LAB — URINALYSIS, ROUTINE W REFLEX MICROSCOPIC
Bilirubin Urine: NEGATIVE
Glucose, UA: NEGATIVE mg/dL
Ketones, ur: NEGATIVE mg/dL
Nitrite: NEGATIVE
Protein, ur: 100 mg/dL — AB
Specific Gravity, Urine: 1.02 (ref 1.005–1.030)
WBC, UA: >50 WBC/hpf — ABNORMAL HIGH (ref 0–5)
pH: 5 (ref 5.0–8.0)

## 2019-11-25 LAB — PHOSPHORUS: Phosphorus: 5.5 mg/dL — ABNORMAL HIGH (ref 2.5–4.6)

## 2019-11-25 LAB — CREATININE, SERUM
Creatinine, Ser: 4.15 mg/dL — ABNORMAL HIGH (ref 0.44–1.00)
GFR calc Af Amer: 11 mL/min — ABNORMAL LOW (ref >60)
GFR calc non Af Amer: 10 mL/min — ABNORMAL LOW (ref >60)

## 2019-11-25 LAB — COMPREHENSIVE METABOLIC PANEL
ALT: 15 U/L (ref 0–44)
AST: 17 U/L (ref 15–41)
Albumin: 2.2 g/dL — ABNORMAL LOW (ref 3.5–5.0)
Alkaline Phosphatase: 75 U/L (ref 38–126)
Anion gap: 13 (ref 5–15)
BUN: 81 mg/dL — ABNORMAL HIGH (ref 8–23)
CO2: 20 mmol/L — ABNORMAL LOW (ref 22–32)
Calcium: 7.5 mg/dL — ABNORMAL LOW (ref 8.9–10.3)
Chloride: 105 mmol/L (ref 98–111)
Creatinine, Ser: 4.1 mg/dL — ABNORMAL HIGH (ref 0.44–1.00)
GFR calc Af Amer: 12 mL/min — ABNORMAL LOW (ref >60)
GFR calc non Af Amer: 10 mL/min — ABNORMAL LOW (ref >60)
Glucose, Bld: 184 mg/dL — ABNORMAL HIGH (ref 70–99)
Potassium: 4.7 mmol/L (ref 3.5–5.1)
Sodium: 138 mmol/L (ref 135–145)
Total Bilirubin: 0.9 mg/dL (ref 0.3–1.2)
Total Protein: 4.6 g/dL — ABNORMAL LOW (ref 6.5–8.1)

## 2019-11-25 LAB — T4, FREE: Free T4: 0.88 ng/dL (ref 0.61–1.12)

## 2019-11-25 LAB — MRSA PCR SCREENING: MRSA by PCR: NEGATIVE

## 2019-11-25 LAB — LACTIC ACID, PLASMA
Lactic Acid, Venous: 4.2 mmol/L (ref 0.5–1.9)
Lactic Acid, Venous: 1.4 mmol/L (ref 0.5–1.9)

## 2019-11-25 LAB — PROTIME-INR
INR: 1.3 — ABNORMAL HIGH (ref 0.8–1.2)
Prothrombin Time: 15.2 seconds (ref 11.4–15.2)

## 2019-11-25 LAB — CK: Total CK: 94 U/L (ref 38–234)

## 2019-11-25 LAB — APTT: aPTT: 40 seconds — ABNORMAL HIGH (ref 24–36)

## 2019-11-25 LAB — TSH: TSH: 3.016 u[IU]/mL (ref 0.350–4.500)

## 2019-11-25 MED ORDER — LACTATED RINGERS IV SOLN: INTRAVENOUS | Status: DC

## 2019-11-25 MED ORDER — ORAL CARE MOUTH RINSE
15.0000 mL | Freq: Two times a day (BID) | OROMUCOSAL | Status: DC
  Administered 2019-11-26 – 2019-11-30 (×10): 15 mL via OROMUCOSAL

## 2019-11-25 MED ORDER — DEXTROSE IN LACTATED RINGERS 5 % IV SOLN: INTRAVENOUS | Status: DC

## 2019-11-25 MED ORDER — LORAZEPAM 2 MG/ML IJ SOLN
0.5000 mg | Freq: Once | INTRAMUSCULAR | Status: DC
  Filled 2019-11-25: qty 1
  Filled 2019-11-25: qty 0.25

## 2019-11-25 MED ORDER — HYDROCORTISONE NA SUCCINATE PF 100 MG IJ SOLR
50.0000 mg | Freq: Four times a day (QID) | INTRAMUSCULAR | Status: DC
  Administered 2019-11-25 – 2019-11-30 (×20): 50 mg via INTRAVENOUS
  Filled 2019-11-25 (×19): qty 2

## 2019-11-25 MED ORDER — PANTOPRAZOLE SODIUM 40 MG PO TBEC
40.0000 mg | DELAYED_RELEASE_TABLET | Freq: Every day | ORAL | Status: DC
  Filled 2019-11-25: qty 1

## 2019-11-25 MED ORDER — ENOXAPARIN SODIUM 30 MG/0.3ML ~~LOC~~ SOLN
30.0000 mg | Freq: Every day | SUBCUTANEOUS | Status: DC
  Administered 2019-11-25 – 2019-11-27 (×3): 30 mg via SUBCUTANEOUS
  Filled 2019-11-25 (×4): qty 0.3

## 2019-11-25 MED ORDER — SODIUM CHLORIDE 0.9 % IV SOLN: INTRAVENOUS | Status: DC | PRN

## 2019-11-25 MED ORDER — PIPERACILLIN-TAZOBACTAM IN DEX 2-0.25 GM/50ML IV SOLN
2.2500 g | Freq: Three times a day (TID) | INTRAVENOUS | Status: DC
  Filled 2019-11-25 (×2): qty 50

## 2019-11-25 MED ORDER — PIPERACILLIN-TAZOBACTAM 3.375 G IVPB
3.3750 g | Freq: Two times a day (BID) | INTRAVENOUS | Status: DC
  Administered 2019-11-25 – 2019-11-26 (×3): 3.375 g via INTRAVENOUS
  Filled 2019-11-25 (×4): qty 50

## 2019-11-25 MED ORDER — CLOPIDOGREL BISULFATE 75 MG PO TABS
75.0000 mg | ORAL_TABLET | Freq: Every day | ORAL | Status: DC
  Filled 2019-11-25 (×2): qty 1

## 2019-11-25 MED ORDER — DEXTROSE 50 % IV SOLN
1.0000 | Freq: Once | INTRAVENOUS | Status: AC
  Administered 2019-11-25: 50 mL via INTRAVENOUS
  Filled 2019-11-25: qty 50

## 2019-11-25 MED ORDER — MAGNESIUM SULFATE 2 GM/50ML IV SOLN
2.0000 g | Freq: Once | INTRAVENOUS | Status: AC
  Administered 2019-11-25: 2 g via INTRAVENOUS
  Filled 2019-11-25: qty 50

## 2019-11-25 MED ORDER — LACTATED RINGERS IV BOLUS
1500.0000 mL | Freq: Once | INTRAVENOUS | Status: AC
  Administered 2019-11-25: 1500 mL via INTRAVENOUS

## 2019-11-25 MED ORDER — CHLORHEXIDINE GLUCONATE CLOTH 2 % EX PADS
6.0000 | MEDICATED_PAD | Freq: Every day | CUTANEOUS | Status: DC
  Administered 2019-11-25 – 2019-12-04 (×11): 6 via TOPICAL

## 2019-11-25 MED ORDER — DEXTROSE 50 % IV SOLN: 1.0000 | Freq: Once | INTRAVENOUS | Status: DC

## 2019-11-25 MED ORDER — ASPIRIN 81 MG PO CHEW
81.0000 mg | CHEWABLE_TABLET | Freq: Every day | ORAL | Status: DC
  Filled 2019-11-25: qty 1

## 2019-11-25 MED ORDER — DOCUSATE SODIUM 100 MG PO CAPS: 100.0000 mg | ORAL_CAPSULE | Freq: Two times a day (BID) | ORAL | Status: DC | PRN

## 2019-11-25 MED ORDER — VANCOMYCIN VARIABLE DOSE PER UNSTABLE RENAL FUNCTION (PHARMACIST DOSING): Status: DC

## 2019-11-25 MED ORDER — INSULIN ASPART 100 UNIT/ML ~~LOC~~ SOLN
0.0000 [IU] | SUBCUTANEOUS | Status: DC
  Administered 2019-11-25 (×3): 2 [IU] via SUBCUTANEOUS
  Administered 2019-11-26: 5 [IU] via SUBCUTANEOUS
  Administered 2019-11-26: 2 [IU] via SUBCUTANEOUS
  Administered 2019-11-26: 3 [IU] via SUBCUTANEOUS
  Administered 2019-11-26: 2 [IU] via SUBCUTANEOUS
  Administered 2019-11-27: 3 [IU] via SUBCUTANEOUS
  Administered 2019-11-27: 2 [IU] via SUBCUTANEOUS
  Administered 2019-11-27 – 2019-11-28 (×5): 3 [IU] via SUBCUTANEOUS
  Administered 2019-11-28: 2 [IU] via SUBCUTANEOUS
  Administered 2019-11-28: 5 [IU] via SUBCUTANEOUS
  Administered 2019-11-28: 2 [IU] via SUBCUTANEOUS
  Administered 2019-11-29: 5 [IU] via SUBCUTANEOUS
  Administered 2019-11-29 (×2): 3 [IU] via SUBCUTANEOUS
  Administered 2019-11-29: 8 [IU] via SUBCUTANEOUS
  Administered 2019-11-29: 5 [IU] via SUBCUTANEOUS
  Administered 2019-11-29 – 2019-11-30 (×6): 3 [IU] via SUBCUTANEOUS
  Administered 2019-12-01 (×2): 5 [IU] via SUBCUTANEOUS
  Administered 2019-12-01 (×2): 3 [IU] via SUBCUTANEOUS

## 2019-11-25 MED ORDER — POLYETHYLENE GLYCOL 3350 17 G PO PACK: 17.0000 g | PACK | Freq: Every day | ORAL | Status: DC | PRN

## 2019-11-25 MED ORDER — SODIUM POLYSTYRENE SULFONATE 15 GM/60ML PO SUSP
30.0000 g | Freq: Once | ORAL | Status: AC
  Administered 2019-11-25: 30 g via RECTAL
  Filled 2019-11-25: qty 120

## 2019-11-25 MED ORDER — CHLORHEXIDINE GLUCONATE 0.12 % MT SOLN
15.0000 mL | Freq: Two times a day (BID) | OROMUCOSAL | Status: DC
  Administered 2019-11-25 – 2019-12-04 (×17): 15 mL via OROMUCOSAL
  Filled 2019-11-25 (×8): qty 15

## 2019-11-25 MED ORDER — LACTATED RINGERS IV BOLUS
500.0000 mL | Freq: Once | INTRAVENOUS | Status: AC
  Administered 2019-11-25: 500 mL via INTRAVENOUS

## 2019-11-25 NOTE — Progress Notes (Signed)
NAME:  Karen Cooper, MRN:  [id number], DOB:  [birth year], LOS: 0 ADMISSION DATE:  11/12/2019, CONSULTATION DATE:  11/25/19 REFERRING MD:  EDP, CHIEF COMPLAINT:  fatigue   Brief History   74 y.o. F with PMH of CAD, HTN, anemia and GIB, type 2 DM who presented from home with fatigue and hypotension.  She was initially bradycardic and started on Epi gtt, work-up consistent with UTI and PCCM consulted for admission.   History of present illness   74 y.o. F with PMH  of CAD, HTN, anemia and GIB, type 2 DM who was brought in for fatigue and poor appetite.  Per EMS report, pt was found sitting in chair and family reported generalized weakness for one week and poor appetite for several days.    On arrival to the ED, pt was bradycardic with HR in the 20's and hypotensive though awake.  She was started on an epinephrin gtt with improvement.  Work-up significant for urinary tract infection with up-trending lactic acid to 4.2 and creatinine of 4 with baseline ~1.3.   She was given Vancomycin and Cefepime and 1.5L IVF.   On exam, pt is awake, though confused and fatigued.  PCCM consulted for admission  Past Medical History   has a past medical history of Coronary artery disease, Hypertension, Symptomatic anemia (04/2018), and Type II diabetes mellitus ([place]).   Significant Hospital Events   8/15 Admit to PCCM   Consults:    Procedures:  Central venous catheter placement 8/15 Arterial line placement 8/15  Significant Diagnostic Tests:  8/14 CXR>>no acute findings  Micro Data:  8/13 UC>> 8/13 BCx2>>  Antimicrobials:  Vancomycin 8/14- Cefepime -  Interim history/subjective:  Pt's hemodynamics improved, still requiring Epi 57mcg Still with occasional agitation, reorientates easily Objective   Blood pressure (!) 129/115, pulse 87, temperature 99.1 F (37.3 C), resp. rate (!) 28, height 5\' 4"  (1.626 m), weight 93.6 kg, SpO2 98 %.        Intake/Output Summary (Last 24 hours) at 11/25/2019  1445 Last data filed at 11/25/2019 1400 Gross per 24 hour  Intake 1087.03 ml  Output 10 ml  Net 1077.03 ml   Filed Weights   11/16/2019 2130 11/25/19 0252  Weight: 93 kg 93.6 kg    General:  Elderly, overweight F, non-toxic appearing and in no acute distress HEENT: Moist oral mucosa Neuro: awake, falls asleep easily CV: S1-S2 appreciated PULM: Clear breath sounds with no added sounds GI: soft, bsx4 active  Extremities: warm/dry, no edema  Skin: no rashes or lesions   Resolved Hospital Problem list     Assessment & Plan:    Sepsis likely secondary to urinary tract infection  With bradycardia and hypotension requiring epinephrine gtt -Pt has received broad spectrum abx, continue Vanc/Cefepime -Received 1.5L IVF, 30cc/kg is ~3L, give additional 1.5L Lactated Ringers -Check CT stone study-no stone noted on study -follow blood and urine cultures -Echo -Blood culture, urine culture negative at present -Leukocytosis-trend  Acute, non-oliguric kidney injury  Given poor po intake may be all pre-renal volume depletion -Give additional 1.5L LR, monitor UOP and renal indices -K initially,, improved to 4.7 -CT abd/pelvis stone study  Hypomagnesemia 1.0, initial 2g IV given ED- corrected to 1.5 -follow and replete as needed  History CAD, HTN Angioplasty and stenting in 2019 with HFpEF -continue home Asa, Plavix -hold Lasix, entresto in the setting of hypotension  Type 2 DM -hold metformin, SSI  Best practice:  Diet: NPO Pain/Anxiety/Delirium protocol (if indicated): n/a  VAP protocol (if indicated): n/a DVT prophylaxis: heparin GI prophylaxis: n/a Glucose control: SSI Mobility: bed rest Code Status: full code Family Communication: husband updated at bedside Disposition: ICU  Labs   CBC: Recent Labs  Lab 12/02/2019 2130 11/14/2019 2210 11/25/19 0046 11/25/19 0133 11/25/19 0921  WBC 8.4  --   --  11.5* 18.4*  HGB 9.8* 10.2* 8.8* 9.6* 9.3*  HCT 31.0* 30.0* 26.0*  30.9* 29.9*  MCV 103.7*  --   --  104.0* 103.5*  PLT 131*  --   --  141* 841    Basic Metabolic Panel: Recent Labs  Lab 11/18/2019 2210 11/25/19 0025 11/25/19 0046 11/25/19 0133 11/25/19 0921  NA 140 138 139  --  140  K 6.0* 4.7 4.7  --  5.3*  CL 107 105  --   --  106  CO2  --  20*  --   --  19*  GLUCOSE 105* 184*  --   --  167*  BUN 100* 81*  --   --  79*  CREATININE 4.10* 4.10*  --  4.15* 4.01*  CALCIUM  --  7.5*  --   --  7.8*  MG  --  1.0*  --   --  1.5*  PHOS  --   --   --   --  5.5*   GFR: Estimated Creatinine Clearance: 13.2 mL/min (A) (by C-G formula based on SCr of 4.01 mg/dL (H)). Recent Labs  Lab 12/01/2019 2130 12/10/2019 2232 11/25/19 0031 11/25/19 0133 11/25/19 0821 11/25/19 0921  WBC 8.4  --   --  11.5*  --  18.4*  LATICACIDVEN  --  1.3 4.2*  --  1.4  --     Liver Function Tests: Recent Labs  Lab 11/25/19 0025  AST 17  ALT 15  ALKPHOS 75  BILITOT 0.9  PROT 4.6*  ALBUMIN 2.2*   No results for input(s): LIPASE, AMYLASE in the last 168 hours. No results for input(s): AMMONIA in the last 168 hours.  ABG    Component Value Date/Time   HCO3 16.8 (L) 11/25/2019 0046   TCO2 18 (L) 11/25/2019 0046   ACIDBASEDEF 9.0 (H) 11/25/2019 0046   O2SAT 98.0 11/25/2019 0046     Coagulation Profile: Recent Labs  Lab 11/25/19 0921  INR 1.3*    Cardiac Enzymes: Recent Labs  Lab 11/25/19 0921  CKTOTAL 94    HbA1C: Hgb A1c MFr Bld  Date/Time Value Ref Range Status  11/25/2019 09:21 AM 6.3 (H) 4.8 - 5.6 % Final    Comment:    (NOTE) Pre diabetes:          5.7%-6.4%  Diabetes:              >6.4%  Glycemic control for   <7.0% adults with diabetes     CBG: Recent Labs  Lab 11/29/2019 2351 11/25/19 1029 11/25/19 1143  GLUCAP 212* 157* 142*    Review of Systems:   Negative except as noted in HPI  Past Medical History  She,  has a past medical history of Coronary artery disease, Hypertension, Symptomatic anemia (04/2018), and Type II  diabetes mellitus ([place]).   Surgical History    Past Surgical History:  Procedure Laterality Date  . BACK SURGERY    . CHOLECYSTECTOMY OPEN  1972  . COLONOSCOPY WITH PROPOFOL N/A 04/28/2018   Procedure: COLONOSCOPY WITH PROPOFOL;  Surgeon: Carol Ada, MD;  Location: [facility];  Service: Endoscopy;  Laterality: N/A;  . CORONARY ANGIOPLASTY  WITH STENT PLACEMENT  01/18/2018  . CORONARY STENT INTERVENTION N/A 01/18/2018   Procedure: CORONARY STENT INTERVENTION;  Surgeon: Troy Sine, MD;  Location: [facility];  Service: Cardiovascular;  Laterality: N/A;  . ESOPHAGOGASTRODUODENOSCOPY  02/22/2018  . ESOPHAGOGASTRODUODENOSCOPY (EGD) WITH PROPOFOL N/A 02/22/2018   Procedure: ESOPHAGOGASTRODUODENOSCOPY (EGD) WITH PROPOFOL;  Surgeon: Wilford Corner, MD;  Location: [facility];  Service: Endoscopy;  Laterality: N/A;  . INCISION AND DRAINAGE  2004   "infection had settled in my back"  . LEFT HEART CATH AND CORONARY ANGIOGRAPHY N/A 01/18/2018   Procedure: LEFT HEART CATH AND CORONARY ANGIOGRAPHY;  Surgeon: Troy Sine, MD;  Location: [facility];  Service: Cardiovascular;  Laterality: N/A;  . [place]   ruptured disk  . POLYPECTOMY  04/28/2018   Procedure: POLYPECTOMY;  Surgeon: Carol Ada, MD;  Location: [facility] END[facility];  Service: Endoscopy;;  . TONSILLECTOMY       Social History   reports that she quit smoking about 2 years ago. Her smoking use included cigarettes. She has a 40.00 pack-year smoking history. She has never used smokeless tobacco. She reports that she does not drink alcohol and does not use drugs.   Family History   Her family history includes Cancer in her sister; Dementia in her mother; Diabetes in her brother; Heart attack in her brother and brother; Heart disease in her father; Heart failure in her mother.   Allergies No Known Allergies   The patient is critically ill with multiple organ systems failure and requires high  complexity decision making for assessment and support, frequent evaluation and titration of therapies, application of advanced monitoring technologies and extensive interpretation of multiple databases. Critical Care Time devoted to patient care services described in this note independent of APP/resident time (if applicable)  is 32 minutes.   Sherrilyn Rist MD [facility] Personal pager: [phone] If unanswered, please page CCM On-call: [phone]

## 2019-11-25 NOTE — Progress Notes (Signed)
[facility] Progress Note Patient Name: Karen Cooper DOB: [birth year] MRN: [id number]   Date of Service  11/25/2019  HPI/Events of Note  Hyperkalemia - K+ = 5.9. On BiPAP, NPO and no gastric tube.   eICU Interventions  Plan: 1. Kayexalate 30 gm PR now. 2. Repeat BMP at 5 AM.     Intervention Category Major Interventions: Electrolyte abnormality - evaluation and management  Kendallyn Lippold [place] 11/25/2019, 10:30 PM

## 2019-11-25 NOTE — Procedures (Signed)
Central Venous Catheter Insertion Procedure Note  SABRINE PATCHEN  [id number]  Apr 25, 1945  Date:11/25/19  Time:6:39 AM   Provider Performing:Prabhnoor Ellenberger R Quientin Jent   Procedure: Insertion of Non-tunneled Central Venous [phone]) with US guidance ([id number])   Indication(s) Medication administration  Consent Risks of the procedure as well as the alternatives and risks of each were explained to the patient and/or caregiver.  Consent for the procedure was obtained and is signed in the bedside chart  Anesthesia Topical only with 1% lidocaine   Timeout Verified patient identification, verified procedure, site/side was marked, verified correct patient position, special equipment/implants available, medications/allergies/relevant history reviewed, required imaging and test results available.  Sterile Technique Maximal sterile technique including full sterile barrier drape, hand hygiene, sterile gown, sterile gloves, mask, hair covering, sterile ultrasound probe cover (if used).  Procedure Description Area of catheter insertion was cleaned with chlorhexidine and draped in sterile fashion.  With real-time ultrasound guidance a central venous catheter was placed into the right internal jugular vein. Nonpulsatile blood flow and easy flushing noted in all ports.  The catheter was sutured in place and sterile dressing applied.  Complications/Tolerance None; patient tolerated the procedure well. Chest X-ray is ordered to verify placement for internal jugular or subclavian cannulation.   Chest x-ray is not ordered for femoral cannulation.  EBL Minimal  Specimen(s) None         Otilio Carpen Zael Shuman, PA-C

## 2019-11-25 NOTE — H&P (Signed)
NAME:  Karen Cooper, MRN:  [id number], DOB:  March 31, 1946, LOS: 0 ADMISSION DATE:  11/17/2019, CONSULTATION DATE:  11/25/19 REFERRING MD:  EDP, CHIEF COMPLAINT:  fatigue   Brief History   73 y.o. F with PMH of CAD, HTN, anemia and GIB, type 2 DM who presented from home with fatigue and hypotension.  She was initially bradycardic and started on Epi gtt, work-up consistent with UTI and PCCM consulted for admission.   History of present illness   73 y.o. F with PMH  of CAD, HTN, anemia and GIB, type 2 DM who was brought in for fatigue and poor appetite.  Per EMS report, pt was found sitting in chair and family reported generalized weakness for one week and poor appetite for several days.    On arrival to the ED, pt was bradycardic with HR in the 20's and hypotensive though awake.  She was started on an epinephrin gtt with improvement.  Work-up significant for urinary tract infection with up-trending lactic acid to 4.2 and creatinine of 4 with baseline ~1.3.   She was given Vancomycin and Cefepime and 1.5L IVF.   On exam, pt is awake, though confused and fatigued.  PCCM consulted for admission  Past Medical History   has a past medical history of Coronary artery disease, Hypertension, Symptomatic anemia (04/2018), and Type II diabetes mellitus ([place]).   Significant Hospital Events   8/15 Admit to PCCM   Consults:    Procedures:    Significant Diagnostic Tests:  8/14 CXR>>no acute findings  Micro Data:  8/13 UC>> 8/13 BCx2>>  Antimicrobials:  Vancomycin 8/14- Cefepime -  Interim history/subjective:  Pt's hemodynamics improved, still requiring Epi 41mcg  Objective   Blood pressure (!) 66/47, pulse (!) 59, temperature (!) 86.7 F (30.4 C), temperature source Rectal, resp. rate 19, height 5\' 4"  (1.626 m), weight 93 kg, SpO2 96 %.       No intake or output data in the 24 hours ending 11/25/19 0117 Filed Weights   12/05/2019 2130  Weight: 93 kg    General:  Elderly, overweight  F, non-toxic appearing and in no acute distress HEENT: MM pink/moist Neuro: awake, answers some questions and then starts to fall asleep, following commands CV: s1s2,  no m/r/g PULM:  Clear, no acute distress GI: soft, bsx4 active  Extremities: warm/dry, no edema  Skin: no rashes or lesions   Resolved Hospital Problem list     Assessment & Plan:    Sepsis likely secondary to urinary tract infection  With bradycardia and hypotension requiring epinephrine gtt -Pt has received broad spectrum abx, continue Vanc/Cefepime -Received 1.5L IVF, 30cc/kg is ~3L, give additional 1.5L Lactated Ringers -Check CT stone study -follow blood and urine cultures -May require central line if BP is not improving after IVF -Echo    Acute, non-oliguric kidney injury  Given poor po intake may be all pre-renal volume depletion -Give additional 1.5L LR, monitor UOP and renal indices -K initially,, improved to 4.7 -CT abd/pelvis stone study    Hypomagnesemia 1.0, initial 2g IV given ED -follow and replete as needed   History CAD, HTN Angioplasty and stenting in 2019 with HFpEF -continue home Asa, Plavix -hold Lasix, entresto in the setting of hypotension  Type 2 DM -hold metformin, SSI  Best practice:  Diet: NPO Pain/Anxiety/Delirium protocol (if indicated): n/a VAP protocol (if indicated): n/a DVT prophylaxis: heparin GI prophylaxis: n/a Glucose control: SSI Mobility: bed rest Code Status: full code Family Communication: husband updated Disposition:  ICU  Labs   CBC: Recent Labs  Lab 11/14/2019 2130 12/09/2019 2210 11/25/19 0046  WBC 8.4  --   --   HGB 9.8* 10.2* 8.8*  HCT 31.0* 30.0* 26.0*  MCV 103.7*  --   --   PLT 131*  --   --     Basic Metabolic Panel: Recent Labs  Lab 11/17/2019 2210 11/25/19 0025 11/25/19 0046  NA 140 138 139  K 6.0* 4.7 4.7  CL 107 105  --   CO2  --  20*  --   GLUCOSE 105* 184*  --   BUN 100* 81*  --   CREATININE 4.10* 4.10*  --   CALCIUM   --  7.5*  --    GFR: Estimated Creatinine Clearance: 12.9 mL/min (A) (by C-G formula based on SCr of 4.1 mg/dL (H)). Recent Labs  Lab 11/18/2019 2130 12/11/2019 2232 11/25/19 0031  WBC 8.4  --   --   LATICACIDVEN  --  1.3 4.2*    Liver Function Tests: Recent Labs  Lab 11/25/19 0025  AST 17  ALT 15  ALKPHOS 75  BILITOT 0.9  PROT 4.6*  ALBUMIN 2.2*   No results for input(s): LIPASE, AMYLASE in the last 168 hours. No results for input(s): AMMONIA in the last 168 hours.  ABG    Component Value Date/Time   HCO3 16.8 (L) 11/25/2019 0046   TCO2 18 (L) 11/25/2019 0046   ACIDBASEDEF 9.0 (H) 11/25/2019 0046   O2SAT 98.0 11/25/2019 0046     Coagulation Profile: No results for input(s): INR, PROTIME in the last 168 hours.  Cardiac Enzymes: No results for input(s): CKTOTAL, CKMB, CKMBINDEX, TROPONINI in the last 168 hours.  HbA1C: No results found for: HGBA1C  CBG: Recent Labs  Lab 11/28/2019 2351  GLUCAP 212*    Review of Systems:   Negative except as noted in HPI  Past Medical History  She,  has a past medical history of Coronary artery disease, Hypertension, Symptomatic anemia (04/2018), and Type II diabetes mellitus ([place]).   Surgical History    Past Surgical History:  Procedure Laterality Date  . BACK SURGERY    . CHOLECYSTECTOMY OPEN  1972  . COLONOSCOPY WITH PROPOFOL N/A 04/28/2018   Procedure: COLONOSCOPY WITH PROPOFOL;  Surgeon: Carol Ada, MD;  Location: [facility];  Service: Endoscopy;  Laterality: N/A;  . CORONARY ANGIOPLASTY WITH STENT PLACEMENT  01/18/2018  . CORONARY STENT INTERVENTION N/A 01/18/2018   Procedure: CORONARY STENT INTERVENTION;  Surgeon: Troy Sine, MD;  Location: [facility];  Service: Cardiovascular;  Laterality: N/A;  . ESOPHAGOGASTRODUODENOSCOPY  02/22/2018  . ESOPHAGOGASTRODUODENOSCOPY (EGD) WITH PROPOFOL N/A 02/22/2018   Procedure: ESOPHAGOGASTRODUODENOSCOPY (EGD) WITH PROPOFOL;  Surgeon: Wilford Corner, MD;   Location: [facility];  Service: Endoscopy;  Laterality: N/A;  . INCISION AND DRAINAGE  2004   "infection had settled in my back"  . LEFT HEART CATH AND CORONARY ANGIOGRAPHY N/A 01/18/2018   Procedure: LEFT HEART CATH AND CORONARY ANGIOGRAPHY;  Surgeon: Troy Sine, MD;  Location: [facility];  Service: Cardiovascular;  Laterality: N/A;  . [place]   ruptured disk  . POLYPECTOMY  04/28/2018   Procedure: POLYPECTOMY;  Surgeon: Carol Ada, MD;  [facility]: [facility] ENDOSCOPY;  Service: Endoscopy;;  . TONSILLECTOMY       Social History   reports that she quit smoking about 2 years ago. Her smoking use included cigarettes. She has a 40.00 pack-year smoking history. She has  never used smokeless tobacco. She reports that she does not drink alcohol and does not use drugs.   Family History   Her family history includes Cancer in her sister; Dementia in her mother; Diabetes in her brother; Heart attack in her brother and brother; Heart disease in her father; Heart failure in her mother.   Allergies No Known Allergies   Home Medications  Prior to Admission medications   Medication Sig Start Date End Date Taking? Authorizing Provider  acetaminophen (TYLENOL) 325 MG tablet Take 325-650 mg by mouth every 6 (six) hours as needed for mild pain or headache.    [provider]  aspirin 81 MG chewable tablet Chew 1 tablet (81 mg total) by mouth daily. 01/19/18   Kroeger, Lorelee Cover., PA-C  atorvastatin (LIPITOR) 20 MG tablet Take 1 tablet (20 mg total) by mouth daily. 06/11/19 09/09/19  Croitoru, Mihai, MD  carvedilol (COREG) 12.5 MG tablet TAKE 1 TABLET(12.5 MG) BY MOUTH TWICE DAILY 05/03/19   Almyra Deforest, PA  Cholecalciferol (VITAMIN D3) 1000 units CAPS Take 2,000 Units by mouth daily.     [provider]  clopidogrel (PLAVIX) 75 MG tablet TAKE 1 TABLET(75 MG) BY MOUTH DAILY 11/05/19   Croitoru, Dani Gobble, MD  ferrous sulfate 325 (65 FE) MG tablet Take 1 tablet (325 mg total)  by mouth 2 (two) times daily with a meal. 04/28/18   Ghimire, Henreitta Leber, MD  furosemide (LASIX) 40 MG tablet Take 40 mg 4 times a week ( Tue,Thurs,Sat,Sun ) 06/11/19   Croitoru, Mihai, MD  magnesium oxide (MAG-OX) 400 MG tablet Take 1 tablet (400 mg total) by mouth daily. 06/12/19   Croitoru, Mihai, MD  metFORMIN (GLUCOPHAGE-XR) 750 MG 24 hr tablet Take 1,500 mg by mouth daily with supper.  12/05/17   [provider]  nitroGLYCERIN (NITROSTAT) 0.4 MG SL tablet Place 1 tablet (0.4 mg total) under the tongue every 5 (five) minutes as needed for chest pain. 01/19/18 01/19/19  Kroeger, Lorelee Cover., PA-C  pantoprazole (PROTONIX) 40 MG tablet TAKE 1 TABLET(40 MG) BY MOUTH DAILY 09/20/19   Croitoru, Mihai, MD  potassium chloride (K-DUR) 10 MEQ tablet Take 1 tablet (10 mEq total) by mouth daily. Patient not taking: Reported on 09/06/2018 06/16/18 09/14/18  Croitoru, Mihai, MD  sacubitril-valsartan (ENTRESTO) 97-103 MG Take 1 tablet by mouth 2 (two) times daily. 07/19/18   Almyra Deforest, PA     Critical care time: 55 minutes      CRITICAL CARE Performed by: Otilio Carpen Kourtlyn Charlet   Total critical care time: 55 minutes  Critical care time was exclusive of separately billable procedures and treating other patients.  Critical care was necessary to treat or prevent imminent or life-threatening deterioration.  Critical care was time spent personally by me on the following activities: development of treatment plan with patient and/or surrogate as well as nursing, discussions with consultants, evaluation of patient's response to treatment, examination of patient, obtaining history from patient or surrogate, ordering and performing treatments and interventions, ordering and review of laboratory studies, ordering and review of radiographic studies, pulse oximetry and re-evaluation of patient's condition.  Otilio Carpen Torrie Namba, PA-C

## 2019-11-25 NOTE — Progress Notes (Signed)
CRITICAL VALUE ALERT  Critical Value:  CBG 40  Date & Time Notified:  11-25-19 @ 1515  Provider Notified: Dr. Ander Slade  Orders Received/Actions taken: 1 amp D50 given

## 2019-11-25 NOTE — Progress Notes (Signed)
ABG with hypercapnia  Will attempt BiPAP

## 2019-11-25 NOTE — Procedures (Signed)
Arterial Catheter Insertion Procedure Note  CATY TESSLER  [id number]  05/18/1945  Date:11/25/19  Time:10:21 AM    Provider Performing: Laurin Coder    Procedure: Insertion of Arterial Line [phone]) with US guidance ([id number])   Indication(s) Blood pressure monitoring and/or need for frequent ABGs  Consent Unable to obtain consent due to emergent nature of procedure.  Anesthesia None   Time Out Verified patient identification, verified procedure, site/side was marked, verified correct patient position, special equipment/implants available, medications/allergies/relevant history reviewed, required imaging and test results available.   Sterile Technique Maximal sterile technique including full sterile barrier drape, hand hygiene, sterile gown, sterile gloves, mask, hair covering, sterile ultrasound probe cover (if used).   Procedure Description Area of catheter insertion was cleaned with chlorhexidine and draped in sterile fashion. With real-time ultrasound guidance an arterial catheter was placed into the right radial artery.  Appropriate arterial tracings confirmed on monitor.     Complications/Tolerance None; patient tolerated the procedure well.   EBL Minimal   Specimen(s) None

## 2019-11-25 NOTE — Progress Notes (Signed)
Pharmacy Antibiotic Note  Karen Cooper is a 74 y.o. female admitted on 11/15/2019 with sepsis.  Pharmacy has been consulted for Vancomycin dosing. WBC mildly elevated. Noted renal dysfunction. Pt is hypothermic and hypotensive. ?urine as source.   Plan: Vancomycin 1000 mg IV x 1, consider random level in 24 hours to assess clearance Zosyn x 1 given, F/U additional gram negative coverage Trend WBC, temp, renal function  F/U infectious work-up Drug levels as indicated   Height: 5\' 4"  (162.6 cm) Weight: 93.6 kg (206 lb 5.6 oz) IBW/kg (Calculated) : 54.7  Temp (24hrs), Avg:89.2 F (31.8 C), Min:86.7 F (30.4 C), Max:90.9 F (32.7 C)  Recent Labs  Lab 11/19/2019 2130 11/22/2019 2210 12/06/2019 2232 11/25/19 0025 11/25/19 0031 11/25/19 0133  WBC 8.4  --   --   --   --  11.5*  CREATININE  --  4.10*  --  4.10*  --  4.15*  LATICACIDVEN  --   --  1.3  --  4.2*  --     Estimated Creatinine Clearance: 12.8 mL/min (A) (by C-G formula based on SCr of 4.15 mg/dL (H)).    No Known Allergies  Narda Bonds, PharmD, BCPS Clinical Pharmacist Phone: [phone]

## 2019-11-25 NOTE — Progress Notes (Signed)
[facility] Progress Note Patient Name: Karen Cooper DOB: 1945-08-05 MRN: [id number]   Date of Service  11/25/2019  HPI/Events of Note  New patient evaluation. 33F with T2DM and HTN who is admitted with suspected urosepsis. In ED, she was bradycardic to the 20s, hypotensive to 50s, and hypothermic to 86.7 F (rectally) requiring several doses of atropine and extensive volume resuscitation.   eICU Interventions  # Neuro: - Bair Hugger for hypothermia. Monitor core temp.  # Cardiac: Has hx of systolic HF (EF 00% in 86/7619, but recovered on most recent 08/2018 echo to 50%). - Stress dose steroids. - Fluid resuscitation and epinephrine drip for goal MAP >\= 65 mmHg. - CVL insertion. - TTE in AM. - Arterial line insertion given bradycardia on presentation and ongoing hypotension.  # Pulm: - Monitor respiratory status closely. She appears to be holding her own for now in terms of maintaining respiratory compensation for her metabolic acidosis, but she does remain at some risk of tiring.  # Renal/GU: - F/u CT A/P to rule out infected kidney stone given severity of presentation: was read as no stone. - Treatment for suspected UTI as below. - F/u CK level given positive hemoglobin on urine dipstick.  # ID: - Vanc/Zosyn empirically for suspected urosepsis. - F/u cultures.      Intervention Category Evaluation Type: New Patient Evaluation  Charlott Rakes 11/25/2019, 4:10 AM

## 2019-11-25 NOTE — Progress Notes (Signed)
RT attempted x2 to place Art line but was unsuccessful, second RT also made 2 attempts without success. RN notified.

## 2019-11-25 NOTE — Progress Notes (Signed)
Pt's urine output was only 20 ccs this shift and bloody. MD aware. Bolus given.

## 2019-11-25 NOTE — ED Provider Notes (Signed)
  Physical Exam  BP (!) 66/47 (BP Location: Right Arm)   Pulse (!) 59   Temp (!) 86.7 F (30.4 C) (Rectal)   Resp 19   Ht 5\' 4"  (1.626 m)   Wt 93 kg   SpO2 96%   BMI 35.19 kg/m   Physical Exam  ED Course/Procedures     Procedures  MDM  Received patient in signout pending ICU admission.  Bradycardia that appears as if it may be atrial fibrillation versus potentially sinus or junctional beats.  Had been given atropine with some improvement.  Heart rate improved some now.  Also hypotension.  Urine appears as if there may be infection.  Has been given antibiotics.  Has had 30/kg fluid bolus.  Dr. Rex Kras discussed with the patient's family reportedly has been dwindling for the last week but worse the last couple days.  Not on steroids or previous thyroid history.  Continued hypotension.  Patient is on a peripheral epi drip at this time.         Davonna Belling, MD 11/25/19 [phone]

## 2019-11-26 ENCOUNTER — Inpatient Hospital Stay (HOSPITAL_COMMUNITY): Payer: Medicare Other

## 2019-11-26 DIAGNOSIS — I340 Nonrheumatic mitral (valve) insufficiency: Secondary | ICD-10-CM

## 2019-11-26 DIAGNOSIS — I361 Nonrheumatic tricuspid (valve) insufficiency: Secondary | ICD-10-CM | POA: Diagnosis not present

## 2019-11-26 LAB — ECHOCARDIOGRAM COMPLETE
Area-P 1/2: 3.48 cm2
Height: 64 in
MV M vel: 3.46 m/s
MV Peak grad: 47.9 mmHg
P 1/2 time: 343 msec
S' Lateral: 3.7 cm
Weight: 3477.98 oz

## 2019-11-26 LAB — GLUCOSE, CAPILLARY
Glucose-Capillary: 100 mg/dL — ABNORMAL HIGH (ref 70–99)
Glucose-Capillary: 125 mg/dL — ABNORMAL HIGH (ref 70–99)
Glucose-Capillary: 129 mg/dL — ABNORMAL HIGH (ref 70–99)
Glucose-Capillary: 138 mg/dL — ABNORMAL HIGH (ref 70–99)
Glucose-Capillary: 147 mg/dL — ABNORMAL HIGH (ref 70–99)
Glucose-Capillary: 169 mg/dL — ABNORMAL HIGH (ref 70–99)
Glucose-Capillary: 206 mg/dL — ABNORMAL HIGH (ref 70–99)
Glucose-Capillary: 31 mg/dL — CL (ref 70–99)
Glucose-Capillary: 32 mg/dL — CL (ref 70–99)

## 2019-11-26 LAB — CBC WITH DIFFERENTIAL/PLATELET
Abs Immature Granulocytes: 0.43 10*3/uL — ABNORMAL HIGH (ref 0.00–0.07)
Basophils Absolute: 0.1 10*3/uL (ref 0.0–0.1)
Basophils Relative: 0 %
Eosinophils Absolute: 0 10*3/uL (ref 0.0–0.5)
Eosinophils Relative: 0 %
HCT: 27.3 % — ABNORMAL LOW (ref 36.0–46.0)
Hemoglobin: 8.6 g/dL — ABNORMAL LOW (ref 12.0–15.0)
Immature Granulocytes: 2 %
Lymphocytes Relative: 7 %
Lymphs Abs: 1.2 10*3/uL (ref 0.7–4.0)
MCH: 32.3 pg (ref 26.0–34.0)
MCHC: 31.5 g/dL (ref 30.0–36.0)
MCV: 102.6 fL — ABNORMAL HIGH (ref 80.0–100.0)
Monocytes Absolute: 0.6 10*3/uL (ref 0.1–1.0)
Monocytes Relative: 3 %
Neutro Abs: 15.6 10*3/uL — ABNORMAL HIGH (ref 1.7–7.7)
Neutrophils Relative %: 88 %
Platelets: 127 10*3/uL — ABNORMAL LOW (ref 150–400)
RBC: 2.66 MIL/uL — ABNORMAL LOW (ref 3.87–5.11)
RDW: 15.9 % — ABNORMAL HIGH (ref 11.5–15.5)
WBC: 17.8 10*3/uL — ABNORMAL HIGH (ref 4.0–10.5)
nRBC: 0.4 % — ABNORMAL HIGH (ref 0.0–0.2)

## 2019-11-26 LAB — BASIC METABOLIC PANEL
Anion gap: 13 (ref 5–15)
Anion gap: 11 (ref 5–15)
BUN: 77 mg/dL — ABNORMAL HIGH (ref 8–23)
BUN: 73 mg/dL — ABNORMAL HIGH (ref 8–23)
CO2: 21 mmol/L — ABNORMAL LOW (ref 22–32)
CO2: 22 mmol/L (ref 22–32)
Calcium: 7.4 mg/dL — ABNORMAL LOW (ref 8.9–10.3)
Calcium: 7.6 mg/dL — ABNORMAL LOW (ref 8.9–10.3)
Chloride: 106 mmol/L (ref 98–111)
Chloride: 108 mmol/L (ref 98–111)
Creatinine, Ser: 4.46 mg/dL — ABNORMAL HIGH (ref 0.44–1.00)
Creatinine, Ser: 4.88 mg/dL — ABNORMAL HIGH (ref 0.44–1.00)
GFR calc Af Amer: 10 mL/min — ABNORMAL LOW (ref >60)
GFR calc Af Amer: 9 mL/min — ABNORMAL LOW (ref >60)
GFR calc non Af Amer: 9 mL/min — ABNORMAL LOW (ref >60)
GFR calc non Af Amer: 8 mL/min — ABNORMAL LOW (ref >60)
Glucose, Bld: 139 mg/dL — ABNORMAL HIGH (ref 70–99)
Glucose, Bld: 100 mg/dL — ABNORMAL HIGH (ref 70–99)
Potassium: 5.4 mmol/L — ABNORMAL HIGH (ref 3.5–5.1)
Potassium: 5.2 mmol/L — ABNORMAL HIGH (ref 3.5–5.1)
Sodium: 140 mmol/L (ref 135–145)
Sodium: 141 mmol/L (ref 135–145)

## 2019-11-26 LAB — POCT I-STAT 7, (LYTES, BLD GAS, ICA,H+H)
Acid-base deficit: 5 mmol/L — ABNORMAL HIGH (ref 0.0–2.0)
Bicarbonate: 21.8 mmol/L (ref 20.0–28.0)
Calcium, Ion: 1.08 mmol/L — ABNORMAL LOW (ref 1.15–1.40)
HCT: 23 % — ABNORMAL LOW (ref 36.0–46.0)
Hemoglobin: 7.8 g/dL — ABNORMAL LOW (ref 12.0–15.0)
O2 Saturation: 98 %
Patient temperature: 98.6
Potassium: 4.7 mmol/L (ref 3.5–5.1)
Sodium: 141 mmol/L (ref 135–145)
TCO2: 23 mmol/L (ref 22–32)
pCO2 arterial: 49.5 mmHg — ABNORMAL HIGH (ref 32.0–48.0)
pH, Arterial: 7.251 — ABNORMAL LOW (ref 7.350–7.450)
pO2, Arterial: 133 mmHg — ABNORMAL HIGH (ref 83.0–108.0)

## 2019-11-26 LAB — VANCOMYCIN, RANDOM: Vancomycin Rm: 9

## 2019-11-26 MED ORDER — DEXTROSE 50 % IV SOLN
INTRAVENOUS | Status: AC
  Administered 2019-11-26: 25 g via INTRAVENOUS
  Filled 2019-11-26: qty 50

## 2019-11-26 MED ORDER — MAGNESIUM SULFATE 2 GM/50ML IV SOLN
2.0000 g | Freq: Once | INTRAVENOUS | Status: AC
  Administered 2019-11-26: 2 g via INTRAVENOUS
  Filled 2019-11-26: qty 50

## 2019-11-26 MED ORDER — SODIUM CHLORIDE 0.9 % IV BOLUS
250.0000 mL | Freq: Once | INTRAVENOUS | Status: AC
  Administered 2019-11-26: 250 mL via INTRAVENOUS

## 2019-11-26 MED ORDER — SODIUM CHLORIDE 0.9% FLUSH: 10.0000 mL | INTRAVENOUS | Status: DC | PRN

## 2019-11-26 MED ORDER — PANTOPRAZOLE SODIUM 40 MG IV SOLR
40.0000 mg | INTRAVENOUS | Status: DC
  Administered 2019-11-26 – 2019-12-01 (×6): 40 mg via INTRAVENOUS
  Filled 2019-11-26 (×7): qty 40

## 2019-11-26 MED ORDER — SODIUM CHLORIDE 0.9% FLUSH
10.0000 mL | Freq: Two times a day (BID) | INTRAVENOUS | Status: DC
  Administered 2019-11-27 – 2019-12-03 (×8): 10 mL

## 2019-11-26 MED ORDER — PIPERACILLIN-TAZOBACTAM IN DEX 2-0.25 GM/50ML IV SOLN
2.2500 g | Freq: Three times a day (TID) | INTRAVENOUS | Status: DC
  Administered 2019-11-26 – 2019-11-27 (×2): 2.25 g via INTRAVENOUS
  Filled 2019-11-26 (×3): qty 50

## 2019-11-26 MED ORDER — DEXTROSE 50 % IV SOLN: 25.0000 g | Freq: Once | INTRAVENOUS | Status: AC

## 2019-11-26 NOTE — Progress Notes (Signed)
NAME:  Karen Cooper, MRN:  [id number], DOB:  [birth year], LOS: 1 ADMISSION DATE:  12/07/2019, CONSULTATION DATE:  11/26/19 REFERRING MD:  EDP, CHIEF COMPLAINT:  fatigue   Brief History   74 y.o. F with PMH of CAD, HTN, anemia and GIB, type 2 DM who presented from home with fatigue and hypotension.  She was initially bradycardic and started on Epi gtt, work-up consistent with UTI and PCCM consulted for admission.   History of present illness   74 y.o. F with PMH  of CAD, HTN, anemia and GIB, type 2 DM who was brought in for fatigue and poor appetite.  Per EMS report, pt was found sitting in chair and family reported generalized weakness for one week and poor appetite for several days.    On arrival to the ED, pt was bradycardic with HR in the 20's and hypotensive though awake.  She was started on an epinephrin gtt with improvement.  Work-up significant for urinary tract infection with up-trending lactic acid to 4.2 and creatinine of 4 with baseline ~1.3.   She was given Vancomycin and Cefepime and 1.5L IVF.   On exam, pt is awake, though confused and fatigued.  PCCM consulted for admission  Past Medical History   has a past medical history of Coronary artery disease, Hypertension, Symptomatic anemia (04/2018), and Type II diabetes mellitus ([place]).   Significant Hospital Events   8/15 Admit to PCCM   Consults:    Procedures:  Central venous catheter placement 8/15 Arterial line placement 8/15  Significant Diagnostic Tests:  8/14 CXR>>no acute findings  Micro Data:  8/13 UC>> 8/13 BCx2>>  Antimicrobials:  Vancomycin 8/14-8/16 Zosyn 8/14.>>  Interim history/subjective:  Pt's hemodynamics improved, was on BiPAP through the night Still with occasional agitation Objective   Blood pressure (!) 78/45, pulse 69, temperature (!) 96.1 F (35.6 C), resp. rate (!) 25, height 5\' 4"  (1.626 m), weight 98.6 kg, SpO2 100 %.    FiO2 (%):  [40 %] 40 %   Intake/Output Summary (Last 24  hours) at 11/26/2019 1428 Last data filed at 11/26/2019 1400 Gross per 24 hour  Intake 3047.4 ml  Output 85 ml  Net 2962.4 ml   Filed Weights   12/05/2019 2130 11/25/19 0252 11/26/19 0500  Weight: 93 kg 93.6 kg 98.6 kg    General:  Elderly, does not appear to be in acute distress HEENT: Moist oral mucosa Neuro: Easily arousable and interactive CV: S1-S2 appreciated PULM: Clear breath sounds with no added sounds GI: soft, bsx4 active  Extremities: warm/dry, no edema  Skin: no rashes or lesions   Resolved Hospital Problem list     Assessment & Plan:    Sepsis likely secondary to urinary tract infection  With bradycardia and hypotension requiring epinephrine gtt -Pt has received broad spectrum abx, continue Vanc/Zosyn -Received 1.5L IVF, 30cc/kg is ~3L, give additional 1.5L Lactated Ringers -No stone on CT study of the abdomen -Urine is showing gram-negative organisms -Echo-results pending -Leukocytosis-trend  Acute, non-oliguric kidney injury  Given poor po intake may be all pre-renal volume depletion -Give additional 1.5L LR, monitor UOP and renal indices -K initially,, improved to 4.7 -CT abd/pelvis stone study -Oliguric  Hypomagnesemia 1.0, initial 2g IV given ED- corrected to 1.5 -follow and replete as needed -Replete in magnesium  History CAD, HTN Angioplasty and stenting in 2019 with HFpEF -continue home Asa, Plavix -hold Lasix, entresto in the setting of hypotension  Type 2 DM -hold metformin, SSI -Episodes of hypoglycemia treated  Labs in a.m.  Best practice:  Diet: NPO Pain/Anxiety/Delirium protocol (if indicated): n/a VAP protocol (if indicated): n/a DVT prophylaxis: heparin GI prophylaxis: n/a Glucose control: SSI Mobility: bed rest Code Status: full code Family Communication: husband updated at bedside Disposition: ICU  Labs   CBC: Recent Labs  Lab 11/26/2019 2130 12/06/2019 2210 11/25/19 0133 11/25/19 0921 11/25/19 1504  11/26/19 0356 11/26/19 0412  WBC 8.4  --  11.5* 18.4*  --  17.8*  --   NEUTROABS  --   --   --   --   --  15.6*  --   HGB 9.8*   < > 9.6* 9.3* 9.5* 8.6* 7.8*  HCT 31.0*   < > 30.9* 29.9* 28.0* 27.3* 23.0*  MCV 103.7*  --  104.0* 103.5*  --  102.6*  --   PLT 131*  --  141* 168  --  127*  --    < > = values in this interval not displayed.    Basic Metabolic Panel: Recent Labs  Lab 12/02/2019 2210 11/29/2019 2210 11/25/19 0025 11/25/19 0046 11/25/19 0133 11/25/19 0921 11/25/19 1504 11/25/19 2011 11/26/19 0356 11/26/19 0412  NA 140   < > 138   < >  --  140 142 140 140 141  K 6.0*   < > 4.7   < >  --  5.3* 5.3* 5.9* 5.4* 4.7  CL 107  --  105  --   --  106  --  107 106  --   CO2  --   --  20*  --   --  19*  --  19* 21*  --   GLUCOSE 105*  --  184*  --   --  167*  --  154* 139*  --   BUN 100*  --  81*  --   --  79*  --  78* 77*  --   CREATININE 4.10*   < > 4.10*  --  4.15* 4.01*  --  4.48* 4.46*  --   CALCIUM  --   --  7.5*  --   --  7.8*  --  7.5* 7.4*  --   MG  --   --  1.0*  --   --  1.5*  --   --   --   --   PHOS  --   --   --   --   --  5.5*  --   --   --   --    < > = values in this interval not displayed.   GFR: Estimated Creatinine Clearance: 12.2 mL/min (A) (by C-G formula based on SCr of 4.46 mg/dL (H)). Recent Labs  Lab 12/01/2019 2130 11/16/2019 2232 11/25/19 0031 11/25/19 0133 11/25/19 0821 11/25/19 0921 11/26/19 0356  WBC 8.4  --   --  11.5*  --  18.4* 17.8*  LATICACIDVEN  --  1.3 4.2*  --  1.4  --   --     Liver Function Tests: Recent Labs  Lab 11/25/19 0025  AST 17  ALT 15  ALKPHOS 75  BILITOT 0.9  PROT 4.6*  ALBUMIN 2.2*   No results for input(s): LIPASE, AMYLASE in the last 168 hours. No results for input(s): AMMONIA in the last 168 hours.  ABG    Component Value Date/Time   PHART 7.251 (L) 11/26/2019 0412   PCO2ART 49.5 (H) 11/26/2019 0412   PO2ART 133 (H) 11/26/2019 0412   HCO3 21.8 11/26/2019 0412  TCO2 23 11/26/2019 0412   ACIDBASEDEF  5.0 (H) 11/26/2019 0412   O2SAT 98.0 11/26/2019 0412     Coagulation Profile: Recent Labs  Lab 11/25/19 0921  INR 1.3*    Cardiac Enzymes: Recent Labs  Lab 11/25/19 0921  CKTOTAL 94    HbA1C: Hgb A1c MFr Bld  Date/Time Value Ref Range Status  11/25/2019 09:21 AM 6.3 (H) 4.8 - 5.6 % Final    Comment:    (NOTE) Pre diabetes:          5.7%-6.4%  Diabetes:              >6.4%  Glycemic control for   <7.0% adults with diabetes     CBG: Recent Labs  Lab 11/26/19 0346 11/26/19 0348 11/26/19 0412 11/26/19 0731 11/26/19 1111  GLUCAP 31* 32* 129* 125* 138*    Review of Systems:   Negative except as noted in HPI  Past Medical History  She,  has a past medical history of Coronary artery disease, Hypertension, Symptomatic anemia (04/2018), and Type II diabetes mellitus ([place]).   Surgical History    Past Surgical History:  Procedure Laterality Date  . BACK SURGERY    . CHOLECYSTECTOMY OPEN  1972  . COLONOSCOPY WITH PROPOFOL N/A 04/28/2018   Procedure: COLONOSCOPY WITH PROPOFOL;  Surgeon: Carol Ada, MD;  Location: [place];  Service: Endoscopy;  Laterality: N/A;  . CORONARY ANGIOPLASTY WITH STENT PLACEMENT  01/18/2018  . CORONARY STENT INTERVENTION N/A 01/18/2018   Procedure: CORONARY STENT INTERVENTION;  Surgeon: Troy Sine, MD;  Location: [place];  Service: Cardiovascular;  Laterality: N/A;  . ESOPHAGOGASTRODUODENOSCOPY  02/22/2018  . ESOPHAGOGASTRODUODENOSCOPY (EGD) WITH PROPOFOL N/A 02/22/2018   Procedure: ESOPHAGOGASTRODUODENOSCOPY (EGD) WITH PROPOFOL;  Surgeon: Wilford Corner, MD;  Location: [place];  Service: Endoscopy;  Laterality: N/A;  . INCISION AND DRAINAGE  2004   "infection had settled in my back"  . LEFT HEART CATH AND CORONARY ANGIOGRAPHY N/A 01/18/2018   Procedure: LEFT HEART CATH AND CORONARY ANGIOGRAPHY;  Surgeon: Troy Sine, MD;  Location: [place] [facility];  Service: Cardiovascular;  Laterality: N/A;  . [place]   ruptured disk  . POLYPECTOMY  04/28/2018   Procedure: POLYPECTOMY;  Surgeon: Carol Ada, MD;  Location: [facility];  Service: Endoscopy;;  . TONSILLECTOMY       Social History   reports that she quit smoking about 2 years ago. Her smoking use included cigarettes. She has a 40.00 pack-year smoking history. She has never used smokeless tobacco. She reports that she does not drink alcohol and does not use drugs.   Family History   Her family history includes Cancer in her sister; Dementia in her mother; Diabetes in her brother; Heart attack in her brother and brother; Heart disease in her father; Heart failure in her mother.   Allergies No Known Allergies   The patient is critically ill with multiple organ systems failure and requires high complexity decision making for assessment and support, frequent evaluation and titration of therapies, application of advanced monitoring technologies and extensive interpretation of multiple databases. Critical Care Time devoted to patient care services described in this note independent of APP/resident time (if applicable)  is 30 minutes.   Sherrilyn Rist MD [facility] Personal pager: [phone] If unanswered, please page CCM On-call: [phone]

## 2019-11-26 NOTE — Progress Notes (Signed)
Inpatient Diabetes Program Recommendations  AACE/ADA: New Consensus Statement on Inpatient Glycemic Control (2015)  Target Ranges:  Prepandial:   less than 140 mg/dL      Peak postprandial:   less than 180 mg/dL (1-2 hours)      Critically ill patients:  140 - 180 mg/dL   Lab Results  Component Value Date   GLUCAP 125 (H) 11/26/2019   HGBA1C 6.3 (H) 11/25/2019    Review of Glycemic Control Results for Karen Cooper, Karen Cooper (MRN [id number]) as of 11/26/2019 10:42  Ref. Range 11/25/2019 23:32 11/26/2019 03:43 11/26/2019 03:46 11/26/2019 03:48 11/26/2019 04:12 11/26/2019 07:31  Glucose-Capillary Latest Ref Range: 70 - 99 mg/dL 132 (H) Novolog 2 units 29 (LL) 31 (LL) 32 (LL) 129 (H) 125 (H) Novolog 2 units   Diabetes history: DM2 Outpatient Diabetes medications: Glucophage 1.5 gm q pm Current orders for Inpatient glycemic control: Novolog moderate correction q 4 hrs. + Solucortef 50 mg q 6 hrs.  Inpatient Diabetes Program Recommendations:   -Decrease Novolog correction to very sensitive 0-6 units Secure chat sent to Dr. Apolinar Junes.  Thank you, Nani Gasser. Ysabel Cowgill, RN, MSN, CDE  Diabetes Coordinator Inpatient Glycemic Control Team Team Pager [phone] (8am-5pm) 11/26/2019 10:48 AM

## 2019-11-26 NOTE — Progress Notes (Signed)
Hypoglycemic Event  CBG: 32  Treatment: D50 50 mL (25 gm)  Symptoms: None  Follow-up CBG: Time:412 CBG Result:129  Possible Reasons for Event: Inadequate meal intake    Karen Cooper

## 2019-11-26 NOTE — Progress Notes (Signed)
  Echocardiogram 2D Echocardiogram has been performed.  Jennette Dubin 11/26/2019, 1:23 PM

## 2019-11-27 DIAGNOSIS — A419 Sepsis, unspecified organism: Secondary | ICD-10-CM

## 2019-11-27 DIAGNOSIS — I5082 Biventricular heart failure: Secondary | ICD-10-CM

## 2019-11-27 DIAGNOSIS — N185 Chronic kidney disease, stage 5: Secondary | ICD-10-CM

## 2019-11-27 DIAGNOSIS — N170 Acute kidney failure with tubular necrosis: Secondary | ICD-10-CM

## 2019-11-27 DIAGNOSIS — N179 Acute kidney failure, unspecified: Secondary | ICD-10-CM

## 2019-11-27 DIAGNOSIS — R6521 Severe sepsis with septic shock: Secondary | ICD-10-CM

## 2019-11-27 LAB — GLUCOSE, CAPILLARY
Glucose-Capillary: 106 mg/dL — ABNORMAL HIGH (ref 70–99)
Glucose-Capillary: 126 mg/dL — ABNORMAL HIGH (ref 70–99)
Glucose-Capillary: 159 mg/dL — ABNORMAL HIGH (ref 70–99)
Glucose-Capillary: 159 mg/dL — ABNORMAL HIGH (ref 70–99)
Glucose-Capillary: 160 mg/dL — ABNORMAL HIGH (ref 70–99)
Glucose-Capillary: 29 mg/dL — CL (ref 70–99)
Glucose-Capillary: 92 mg/dL (ref 70–99)

## 2019-11-27 LAB — CBC WITH DIFFERENTIAL/PLATELET
Abs Immature Granulocytes: 0.34 10*3/uL — ABNORMAL HIGH (ref 0.00–0.07)
Basophils Absolute: 0 10*3/uL (ref 0.0–0.1)
Basophils Relative: 0 %
Eosinophils Absolute: 0 10*3/uL (ref 0.0–0.5)
Eosinophils Relative: 0 %
HCT: 25.9 % — ABNORMAL LOW (ref 36.0–46.0)
Hemoglobin: 8.1 g/dL — ABNORMAL LOW (ref 12.0–15.0)
Immature Granulocytes: 2 %
Lymphocytes Relative: 5 %
Lymphs Abs: 0.8 10*3/uL (ref 0.7–4.0)
MCH: 32.1 pg (ref 26.0–34.0)
MCHC: 31.3 g/dL (ref 30.0–36.0)
MCV: 102.8 fL — ABNORMAL HIGH (ref 80.0–100.0)
Monocytes Absolute: 0.6 10*3/uL (ref 0.1–1.0)
Monocytes Relative: 4 %
Neutro Abs: 13.8 10*3/uL — ABNORMAL HIGH (ref 1.7–7.7)
Neutrophils Relative %: 89 %
Platelets: 107 10*3/uL — ABNORMAL LOW (ref 150–400)
RBC: 2.52 MIL/uL — ABNORMAL LOW (ref 3.87–5.11)
RDW: 16.1 % — ABNORMAL HIGH (ref 11.5–15.5)
WBC: 15.6 10*3/uL — ABNORMAL HIGH (ref 4.0–10.5)
nRBC: 0.4 % — ABNORMAL HIGH (ref 0.0–0.2)

## 2019-11-27 LAB — BASIC METABOLIC PANEL
Anion gap: 11 (ref 5–15)
BUN: 75 mg/dL — ABNORMAL HIGH (ref 8–23)
CO2: 21 mmol/L — ABNORMAL LOW (ref 22–32)
Calcium: 7.7 mg/dL — ABNORMAL LOW (ref 8.9–10.3)
Chloride: 109 mmol/L (ref 98–111)
Creatinine, Ser: 5.08 mg/dL — ABNORMAL HIGH (ref 0.44–1.00)
GFR calc Af Amer: 9 mL/min — ABNORMAL LOW (ref >60)
GFR calc non Af Amer: 8 mL/min — ABNORMAL LOW (ref >60)
Glucose, Bld: 173 mg/dL — ABNORMAL HIGH (ref 70–99)
Potassium: 5.1 mmol/L (ref 3.5–5.1)
Sodium: 141 mmol/L (ref 135–145)

## 2019-11-27 LAB — POCT I-STAT EG7
Acid-base deficit: 6 mmol/L — ABNORMAL HIGH (ref 0.0–2.0)
Bicarbonate: 21.7 mmol/L (ref 20.0–28.0)
Calcium, Ion: 1.13 mmol/L — ABNORMAL LOW (ref 1.15–1.40)
HCT: 24 % — ABNORMAL LOW (ref 36.0–46.0)
Hemoglobin: 8.2 g/dL — ABNORMAL LOW (ref 12.0–15.0)
O2 Saturation: 70 %
Potassium: 4.8 mmol/L (ref 3.5–5.1)
Sodium: 143 mmol/L (ref 135–145)
TCO2: 23 mmol/L (ref 22–32)
pCO2, Ven: 51.8 mmHg (ref 44.0–60.0)
pH, Ven: 7.231 — ABNORMAL LOW (ref 7.250–7.430)
pO2, Ven: 44 mmHg (ref 32.0–45.0)

## 2019-11-27 LAB — URINE CULTURE: Culture: >=100000 — AB

## 2019-11-27 MED ORDER — SODIUM CHLORIDE 0.9 % IV SOLN
1.0000 g | INTRAVENOUS | Status: AC
  Administered 2019-11-27 – 2019-12-01 (×5): 1 g via INTRAVENOUS
  Filled 2019-11-27 (×5): qty 10

## 2019-11-27 MED ORDER — HALOPERIDOL LACTATE 5 MG/ML IJ SOLN
2.0000 mg | Freq: Once | INTRAMUSCULAR | Status: AC
  Administered 2019-11-27: 2 mg via INTRAVENOUS
  Filled 2019-11-27: qty 1

## 2019-11-27 MED ORDER — PIPERACILLIN-TAZOBACTAM 3.375 G IVPB
3.3750 g | Freq: Two times a day (BID) | INTRAVENOUS | Status: DC
  Filled 2019-11-27: qty 50

## 2019-11-27 NOTE — Progress Notes (Signed)
NAME:  Karen Cooper, MRN:  [id number], DOB:  [birth year], LOS: 2 ADMISSION DATE:  11/30/2019, CONSULTATION DATE:  11/27/19 REFERRING MD:  EDP, CHIEF COMPLAINT:  fatigue   Brief History   74 y.o. F with PMH of CAD, HTN, anemia and GIB, type 2 DM who presented from home with fatigue and hypotension.  She was initially bradycardic and started on Epi gtt, work-up consistent with UTI and PCCM consulted for admission.   History of present illness   74 y.o. F with PMH  of CAD, HTN, anemia and GIB, type 2 DM who was brought in for fatigue and poor appetite.  Per EMS report, pt was found sitting in chair and family reported generalized weakness for one week and poor appetite for several days.    On arrival to the ED, pt was bradycardic with HR in the 20's and hypotensive though awake.  She was started on an epinephrin gtt with improvement.  Work-up significant for urinary tract infection with up-trending lactic acid to 4.2 and creatinine of 4 with baseline ~1.3.   She was given Vancomycin and Cefepime and 1.5L IVF.   On exam, pt is awake, though confused and fatigued.  PCCM consulted for admission  Past Medical History   has a past medical history of Coronary artery disease, Hypertension, Symptomatic anemia (04/2018), and Type II diabetes mellitus ([place]).   Significant Hospital Events   8/15 Admit to PCCM   Consults:    Procedures:  Central venous catheter placement 8/15 Arterial line placement 8/15  Significant Diagnostic Tests:  8/14 CXR>>no acute findings  Micro Data:  8/13 UC>> 8/13 BCx2>>  Antimicrobials:  Vancomycin 8/14-8/16 Zosyn 8/14.>>8/16 CTX 8/17>>  Interim history/subjective:  Hypotension improving but still on Epi, wearing BiPAP, UOP very poor. Cr increasing. Bicarb maybe slowly improving.  Objective   Blood pressure (!) 88/76, pulse 79, temperature 98.8 F (37.1 C), resp. rate 20, height 5\' 4"  (1.626 m), weight 99.5 kg, SpO2 100 %.    FiO2 (%):  [40 %] 40 %    Intake/Output Summary (Last 24 hours) at 11/27/2019 1025 Last data filed at 11/27/2019 0700 Gross per 24 hour  Intake 2688.61 ml  Output 62 ml  Net 2626.61 ml   Filed Weights   11/25/19 0252 11/26/19 0500 11/27/19 0500  Weight: 93.6 kg 98.6 kg 99.5 kg    General:  Elderly, does not appear to be in acute distress HEENT: Moist oral mucosa Neuro: Easily arousable and interactive CV: S1-S2 appreciated PULM: Clear breath sounds with no added sounds GI: soft, bsx4 active  Extremities: warm/dry, no edema  Skin: no rashes or lesions   Resolved Hospital Problem list     Assessment & Plan:    Septic shock secondary to urinary tract infection with K pneumonaie With bradycardia and hypotension requiring epinephrine gtt -CTX per sensitivities -Epi, MAP goal > 65 -No more IVF given TTE with RA pressure elevated, swelling  Heart failure with reduced EF 40%: TTE 11/26/19 worse than 10/621, diastolic dysfunction, reduced EF of RV. --Holding home lasix, entresto given hypotension  Acute,oliguric kidney failure: Cr continues to rise, UOP marginal. --ATN from shock, concern for venous congestion after appropriate fluid resuscitation  --Consider lasix trial, hesitant while on epi but Bps imrpoving  Hypomagnesemia -follow and replete as needed -Replete in magnesium  History CAD, HTN Angioplasty and stenting in 2019 with HFpEF -continue home Asa, Plavix  Type 2 DM -hold metformin, SSI -Episodes of hypoglycemia treated    Best practice:  Diet: NPO Pain/Anxiety/Delirium protocol (if indicated): n/a VAP protocol (if indicated): n/a DVT prophylaxis: heparin GI prophylaxis: n/a Glucose control: SSI Mobility: bed rest Code Status: full code Family Communication: husband updated Disposition: ICU  Labs   CBC: Recent Labs  Lab 11/15/2019 2130 11/13/2019 2210 11/25/19 0133 11/25/19 0133 11/25/19 6144 11/25/19 0921 11/25/19 1504 11/26/19 0356 11/26/19 0412 11/27/19 0324  11/27/19 0817  WBC 8.4  --  11.5*  --  18.4*  --   --  17.8*  --  15.6*  --   NEUTROABS  --   --   --   --   --   --   --  15.6*  --  13.8*  --   HGB 9.8*   < > 9.6*   < > 9.3*   < > 9.5* 8.6* 7.8* 8.1* 8.2*  HCT 31.0*   < > 30.9*   < > 29.9*   < > 28.0* 27.3* 23.0* 25.9* 24.0*  MCV 103.7*  --  104.0*  --  103.5*  --   --  102.6*  --  102.8*  --   PLT 131*  --  141*  --  168  --   --  127*  --  107*  --    < > = values in this interval not displayed.    Basic Metabolic Panel: Recent Labs  Lab 11/25/19 0025 11/25/19 0046 11/25/19 0921 11/25/19 1504 11/25/19 2011 11/25/19 2011 11/26/19 0356 11/26/19 0412 11/26/19 1537 11/27/19 0324 11/27/19 0817  NA 138   < > 140   < > 140   < > 140 141 141 141 143  K 4.7   < > 5.3*   < > 5.9*   < > 5.4* 4.7 5.2* 5.1 4.8  CL 105   < > 106  --  107  --  106  --  108 109  --   CO2 20*   < > 19*  --  19*  --  21*  --  22 21*  --   GLUCOSE 184*   < > 167*  --  154*  --  139*  --  100* 173*  --   BUN 81*   < > 79*  --  78*  --  77*  --  73* 75*  --   CREATININE 4.10*   < > 4.01*  --  4.48*  --  4.46*  --  4.88* 5.08*  --   CALCIUM 7.5*   < > 7.8*  --  7.5*  --  7.4*  --  7.6* 7.7*  --   MG 1.0*  --  1.5*  --   --   --   --   --   --   --   --   PHOS  --   --  5.5*  --   --   --   --   --   --   --   --    < > = values in this interval not displayed.   GFR: Estimated Creatinine Clearance: 10.8 mL/min (A) (by C-G formula based on SCr of 5.08 mg/dL (H)). Recent Labs  Lab 11/28/2019 2130 11/17/2019 2232 11/25/19 0031 11/25/19 0133 11/25/19 0821 11/25/19 0921 11/26/19 0356 11/27/19 0324  WBC   < >  --   --  11.5*  --  18.4* 17.8* 15.6*  LATICACIDVEN  --  1.3 4.2*  --  1.4  --   --   --    < > =  values in this interval not displayed.    Liver Function Tests: Recent Labs  Lab 11/25/19 0025  AST 17  ALT 15  ALKPHOS 75  BILITOT 0.9  PROT 4.6*  ALBUMIN 2.2*   No results for input(s): LIPASE, AMYLASE in the last 168 hours. No results for  input(s): AMMONIA in the last 168 hours.  ABG    Component Value Date/Time   PHART 7.251 (L) 11/26/2019 0412   PCO2ART 49.5 (H) 11/26/2019 0412   PO2ART 133 (H) 11/26/2019 0412   HCO3 21.7 11/27/2019 0817   TCO2 23 11/27/2019 0817   ACIDBASEDEF 6.0 (H) 11/27/2019 0817   O2SAT 70.0 11/27/2019 0817     Coagulation Profile: Recent Labs  Lab 11/25/19 0921  INR 1.3*    Cardiac Enzymes: Recent Labs  Lab 11/25/19 0921  CKTOTAL 94    HbA1C: Hgb A1c MFr Bld  Date/Time Value Ref Range Status  11/25/2019 09:21 AM 6.3 (H) 4.8 - 5.6 % Final    Comment:    (NOTE) Pre diabetes:          5.7%-6.4%  Diabetes:              >6.4%  Glycemic control for   <7.0% adults with diabetes     CBG: Recent Labs  Lab 11/26/19 1524 11/26/19 1923 11/26/19 2342 11/27/19 0324 11/27/19 0730  GLUCAP 100* 169* 206* 159* 106*    Review of Systems:   Negative except as noted in HPI  Past Medical History  She,  has a past medical history of Coronary artery disease, Hypertension, Symptomatic anemia (04/2018), and Type II diabetes mellitus ([place]).   Surgical History    Past Surgical History:  Procedure Laterality Date  . BACK SURGERY    . CHOLECYSTECTOMY OPEN  1972  . COLONOSCOPY WITH PROPOFOL N/A 04/28/2018   Procedure: COLONOSCOPY WITH PROPOFOL;  Surgeon: Carol Ada, MD;  Location: [place];  Service: Endoscopy;  Laterality: N/A;  . CORONARY ANGIOPLASTY WITH STENT PLACEMENT  01/18/2018  . CORONARY STENT INTERVENTION N/A 01/18/2018   Procedure: CORONARY STENT INTERVENTION;  Surgeon: Troy Sine, MD;  Location: [place] [facility];  Service: Cardiovascular;  Laterality: N/A;  . ESOPHAGOGASTRODUODENOSCOPY  02/22/2018  . ESOPHAGOGASTRODUODENOSCOPY (EGD) WITH PROPOFOL N/A 02/22/2018   Procedure: ESOPHAGOGASTRODUODENOSCOPY (EGD) WITH PROPOFOL;  Surgeon: Wilford Corner, MD;  Location: [place];  Service: Endoscopy;  Laterality: N/A;  . INCISION AND DRAINAGE  2004   "infection  had settled in my back"  . LEFT HEART CATH AND CORONARY ANGIOGRAPHY N/A 01/18/2018   Procedure: LEFT HEART CATH AND CORONARY ANGIOGRAPHY;  Surgeon: Troy Sine, MD;  Location: [place] [facility];  Service: Cardiovascular;  Laterality: N/A;  . [place]   ruptured disk  . POLYPECTOMY  04/28/2018   Procedure: POLYPECTOMY;  Surgeon: Carol Ada, MD;  Location: [facility] END[facility];  Service: Endoscopy;;  . TONSILLECTOMY       Social History   reports that she quit smoking about 2 years ago. Her smoking use included cigarettes. She has a 40.00 pack-year smoking history. She has never used smokeless tobacco. She reports that she does not drink alcohol and does not use drugs.   Family History   Her family history includes Cancer in her sister; Dementia in her mother; Diabetes in her brother; Heart attack in her brother and brother; Heart disease in her father; Heart failure in her mother.   Allergies No Known Allergies   The patient is critically ill with  multiple organ systems failure and requires high complexity decision making for assessment and support, frequent evaluation and titration of therapies, application of advanced monitoring technologies and extensive interpretation of multiple databases. Critical Care Time devoted to patient care services described in this note independent of APP/resident time (if applicable)  is 42 minutes.   Larey Days MD [facility] Personal pager: [phone] If unanswered, please page CCM On-call: [phone]

## 2019-11-27 NOTE — ACP (Advance Care Planning) (Signed)
Called patient husband, [name].  Discussed patient's current status.  Critically ill.  With biventricular failure worse compared to May 2020.  Septic shock in setting of UTI, possibly additional contribution of volume overload/cardiogenic shock.  Renal failure, oliguric, due to ATN in the setting of septic shock as well as venous congestion in the setting of biventricular failure.  She has toxic metabolic encephalopathy in setting of shock, renal failure, ICU delirium.  She has mixed hypercapnic/hypoxemic respiratory failure.  Essentially multiorgan failure without signs of significant improvement improvement.  He stated that using machines to "keep her alive just to keep her alive" would not be what she wanted.  Discussed my concerns that she was likely actively dying and despite our best efforts she may very well pass in the next days.  He expressed understanding.  After shared decision-making he elected to make the patient DNR.

## 2019-11-28 LAB — CBC WITH DIFFERENTIAL/PLATELET
Abs Immature Granulocytes: 0.33 10*3/uL — ABNORMAL HIGH (ref 0.00–0.07)
Basophils Absolute: 0 10*3/uL (ref 0.0–0.1)
Basophils Relative: 0 %
Eosinophils Absolute: 0 10*3/uL (ref 0.0–0.5)
Eosinophils Relative: 0 %
HCT: 24.8 % — ABNORMAL LOW (ref 36.0–46.0)
Hemoglobin: 7.7 g/dL — ABNORMAL LOW (ref 12.0–15.0)
Immature Granulocytes: 2 %
Lymphocytes Relative: 5 %
Lymphs Abs: 0.7 10*3/uL (ref 0.7–4.0)
MCH: 32 pg (ref 26.0–34.0)
MCHC: 31 g/dL (ref 30.0–36.0)
MCV: 102.9 fL — ABNORMAL HIGH (ref 80.0–100.0)
Monocytes Absolute: 0.6 10*3/uL (ref 0.1–1.0)
Monocytes Relative: 4 %
Neutro Abs: 13.6 10*3/uL — ABNORMAL HIGH (ref 1.7–7.7)
Neutrophils Relative %: 89 %
Platelets: 97 10*3/uL — ABNORMAL LOW (ref 150–400)
RBC: 2.41 MIL/uL — ABNORMAL LOW (ref 3.87–5.11)
RDW: 16.4 % — ABNORMAL HIGH (ref 11.5–15.5)
WBC: 15.2 10*3/uL — ABNORMAL HIGH (ref 4.0–10.5)
nRBC: 0.3 % — ABNORMAL HIGH (ref 0.0–0.2)

## 2019-11-28 LAB — BASIC METABOLIC PANEL
Anion gap: 13 (ref 5–15)
BUN: 77 mg/dL — ABNORMAL HIGH (ref 8–23)
CO2: 19 mmol/L — ABNORMAL LOW (ref 22–32)
Calcium: 8.2 mg/dL — ABNORMAL LOW (ref 8.9–10.3)
Chloride: 109 mmol/L (ref 98–111)
Creatinine, Ser: 5.41 mg/dL — ABNORMAL HIGH (ref 0.44–1.00)
GFR calc Af Amer: 8 mL/min — ABNORMAL LOW (ref >60)
GFR calc non Af Amer: 7 mL/min — ABNORMAL LOW (ref >60)
Glucose, Bld: 159 mg/dL — ABNORMAL HIGH (ref 70–99)
Potassium: 5 mmol/L (ref 3.5–5.1)
Sodium: 141 mmol/L (ref 135–145)

## 2019-11-28 LAB — GLUCOSE, CAPILLARY
Glucose-Capillary: 107 mg/dL — ABNORMAL HIGH (ref 70–99)
Glucose-Capillary: 138 mg/dL — ABNORMAL HIGH (ref 70–99)
Glucose-Capillary: 146 mg/dL — ABNORMAL HIGH (ref 70–99)
Glucose-Capillary: 153 mg/dL — ABNORMAL HIGH (ref 70–99)
Glucose-Capillary: 194 mg/dL — ABNORMAL HIGH (ref 70–99)
Glucose-Capillary: 198 mg/dL — ABNORMAL HIGH (ref 70–99)
Glucose-Capillary: 248 mg/dL — ABNORMAL HIGH (ref 70–99)

## 2019-11-28 MED ORDER — ASPIRIN 81 MG PO CHEW
81.0000 mg | CHEWABLE_TABLET | Freq: Every day | ORAL | Status: DC
  Administered 2019-11-28 – 2019-12-01 (×4): 81 mg
  Filled 2019-11-28 (×5): qty 1

## 2019-11-28 MED ORDER — CLOPIDOGREL BISULFATE 75 MG PO TABS
75.0000 mg | ORAL_TABLET | Freq: Every day | ORAL | Status: DC
  Administered 2019-11-28 – 2019-12-01 (×4): 75 mg
  Filled 2019-11-28 (×4): qty 1

## 2019-11-28 MED ORDER — HEPARIN SODIUM (PORCINE) 5000 UNIT/ML IJ SOLN
5000.0000 [IU] | Freq: Three times a day (TID) | INTRAMUSCULAR | Status: DC
  Administered 2019-11-28 – 2019-11-30 (×6): 5000 [IU] via SUBCUTANEOUS
  Filled 2019-11-28 (×7): qty 1

## 2019-11-28 MED ORDER — PROSOURCE TF PO LIQD
45.0000 mL | Freq: Two times a day (BID) | ORAL | Status: DC
  Administered 2019-11-28 – 2019-12-01 (×7): 45 mL
  Filled 2019-11-28 (×9): qty 45

## 2019-11-28 MED ORDER — POLYETHYLENE GLYCOL 3350 17 G PO PACK: 17.0000 g | PACK | Freq: Every day | ORAL | Status: DC | PRN

## 2019-11-28 MED ORDER — FUROSEMIDE 10 MG/ML IJ SOLN
120.0000 mg | Freq: Once | INTRAVENOUS | Status: AC
  Administered 2019-11-28: 120 mg via INTRAVENOUS
  Filled 2019-11-28: qty 10

## 2019-11-28 MED ORDER — HEPARIN SODIUM (PORCINE) 5000 UNIT/ML IJ SOLN: 5000.0000 [IU] | Freq: Three times a day (TID) | INTRAMUSCULAR | Status: DC

## 2019-11-28 MED ORDER — DOCUSATE SODIUM 50 MG/5ML PO LIQD: 100.0000 mg | Freq: Two times a day (BID) | ORAL | Status: DC | PRN

## 2019-11-28 MED ORDER — JEVITY 1.2 CAL PO LIQD
1000.0000 mL | ORAL | Status: DC
  Administered 2019-11-28 – 2019-11-30 (×3): 1000 mL
  Filled 2019-11-28 (×6): qty 1000

## 2019-11-28 NOTE — Progress Notes (Signed)
NAME:  Karen Cooper, MRN:  [id number], DOB:  1945/04/20, LOS: 3 ADMISSION DATE:  11/25/2019, CONSULTATION DATE:  11/28/19 REFERRING MD:  EDP, CHIEF COMPLAINT:  fatigue   Brief History   74 y.o. F with PMH of CAD, HTN, anemia and GIB, type 2 DM who presented from home with fatigue and hypotension.  She was initially bradycardic and started on Epi gtt, work-up consistent with UTI and PCCM consulted for admission.   History of present illness   74 y.o. F with PMH  of CAD, HTN, anemia and GIB, type 2 DM who was brought in for fatigue and poor appetite.  Per EMS report, pt was found sitting in chair and family reported generalized weakness for one week and poor appetite for several days.    On arrival to the ED, pt was bradycardic with HR in the 20's and hypotensive though awake.  She was started on an epinephrin gtt with improvement.  Work-up significant for urinary tract infection with up-trending lactic acid to 4.2 and creatinine of 4 with baseline ~1.3.   She was given Vancomycin and Cefepime and 1.5L IVF.   On exam, pt is awake, though confused and fatigued.  PCCM consulted for admission  Past Medical History   has a past medical history of Coronary artery disease, Hypertension, Symptomatic anemia (04/2018), and Type II diabetes mellitus ([place]).   Significant Hospital Events   8/15 Admit to PCCM   Consults:    Procedures:  Central venous catheter placement 8/15 Arterial line placement 8/15  Significant Diagnostic Tests:  8/14 CXR>>no acute findings  Micro Data:  8/13 UC>> 8/13 BCx2>>  Antimicrobials:  Vancomycin 8/14-8/16 Zosyn 8/14.>>8/16 CTX 8/17>>  Interim history/subjective:  Hypotension stable still on NE. wearing BiPAP, UOP very poor ~100 cc a day. Cr increasing.  Objective   Blood pressure 136/61, pulse 73, temperature 98.1 F (36.7 C), temperature source Core, resp. rate 16, height 5\' 4"  (1.626 m), weight 101.2 kg, SpO2 99 %.    FiO2 (%):  [40 %] 40 %    Intake/Output Summary (Last 24 hours) at 11/28/2019 6440 Last data filed at 11/28/2019 0600 Gross per 24 hour  Intake 2834.27 ml  Output 75 ml  Net 2759.27 ml   Filed Weights   11/26/19 0500 11/27/19 0500 11/28/19 0408  Weight: 98.6 kg 99.5 kg 101.2 kg    General:  Elderly, agitated Neuro: Easily arousable, disoriented, dysarthric CV: S1-S2 appreciated PULM: coarse breath sounds anteriorly with no added sounds GI: soft, bsx4 active  Extremities: warm/dry, bilateral pitting edema Skin: no rashes or lesions   Resolved Hospital Problem list     Assessment & Plan:    Septic shock secondary to urinary tract infection with K pneumonaie With bradycardia and hypotension requiring epinephrine gtt. Central venous sat 70% reassuring against cardiogenic component. -CTX per sensitivities -Epi, MAP goal > 65 -No more IVF given TTE with RA pressure elevated, swelling  Heart failure with reduced EF 40%: TTE 11/26/19 worse than 06/4740, diastolic dysfunction, reduced EF of RV. --Lasix 120 mg IV   Acute,oliguric kidney failure: Cr continues to rise, UOP marginal. --ATN from shock, concern for venous congestion after appropriate fluid resuscitation  --Lasix trial 8/18 --Poor dialysis candidate  Toxic Metabolic Encephalopathy: septic shock, respiratory failure, ICU delirium --Delirium precautions --PRN haldol has responded well when agitated  History CAD, HTN Angioplasty and stenting in 2019 with HFpEF -continue home [place], Plavix  Type 2 DM -hold metformin, SSI -Episodes of hypoglycemia treated  Best practice:  Diet: NPO, consider Cortrak Pain/Anxiety/Delirium protocol (if indicated): n/a VAP protocol (if indicated): n/a DVT prophylaxis: heparin GI prophylaxis: n/a Glucose control: SSI Mobility: bed rest Code Status: DNR Family Communication: husband updated Disposition: ICU  Labs   CBC: Recent Labs  Lab 11/25/19 0133 11/25/19 0133 11/25/19 0921 11/25/19 1504  11/26/19 0356 11/26/19 0412 11/27/19 0324 11/27/19 0817 11/28/19 0440  WBC 11.5*  --  18.4*  --  17.8*  --  15.6*  --  15.2*  NEUTROABS  --   --   --   --  15.6*  --  13.8*  --  13.6*  HGB 9.6*   < > 9.3*   < > 8.6* 7.8* 8.1* 8.2* 7.7*  HCT 30.9*   < > 29.9*   < > 27.3* 23.0* 25.9* 24.0* 24.8*  MCV 104.0*  --  103.5*  --  102.6*  --  102.8*  --  102.9*  PLT 141*  --  168  --  127*  --  107*  --  97*   < > = values in this interval not displayed.    Basic Metabolic Panel: Recent Labs  Lab 11/25/19 0025 11/25/19 0046 11/25/19 0921 11/25/19 1504 11/25/19 2011 11/25/19 2011 11/26/19 0356 11/26/19 0356 11/26/19 0412 11/26/19 1537 11/27/19 0324 11/27/19 0817 11/28/19 0440  NA 138   < > 140   < > 140   < > 140   < > 141 141 141 143 141  K 4.7   < > 5.3*   < > 5.9*   < > 5.4*   < > 4.7 5.2* 5.1 4.8 5.0  CL 105   < > 106  --  107  --  106  --   --  108 109  --  109  CO2 20*   < > 19*  --  19*  --  21*  --   --  22 21*  --  19*  GLUCOSE 184*   < > 167*  --  154*  --  139*  --   --  100* 173*  --  159*  BUN 81*   < > 79*  --  78*  --  77*  --   --  73* 75*  --  77*  CREATININE 4.10*   < > 4.01*  --  4.48*  --  4.46*  --   --  4.88* 5.08*  --  5.41*  CALCIUM 7.5*   < > 7.8*  --  7.5*  --  7.4*  --   --  7.6* 7.7*  --  8.2*  MG 1.0*  --  1.5*  --   --   --   --   --   --   --   --   --   --   PHOS  --   --  5.5*  --   --   --   --   --   --   --   --   --   --    < > = values in this interval not displayed.   GFR: Estimated Creatinine Clearance: 10.2 mL/min (A) (by C-G formula based on SCr of 5.41 mg/dL (H)). Recent Labs  Lab 11/28/2019 2232 11/25/19 0031 11/25/19 0133 11/25/19 0821 11/25/19 0921 11/26/19 0356 11/27/19 0324 11/28/19 0440  WBC  --   --    < >  --  18.4* 17.8* 15.6* 15.2*  LATICACIDVEN 1.3 4.2*  --  1.4  --   --   --   --    < > = values in this interval not displayed.    Liver Function Tests: Recent Labs  Lab 11/25/19 0025  AST 17  ALT 15  ALKPHOS 75   BILITOT 0.9  PROT 4.6*  ALBUMIN 2.2*   No results for input(s): LIPASE, AMYLASE in the last 168 hours. No results for input(s): AMMONIA in the last 168 hours.  ABG    Component Value Date/Time   PHART 7.251 (L) 11/26/2019 0412   PCO2ART 49.5 (H) 11/26/2019 0412   PO2ART 133 (H) 11/26/2019 0412   HCO3 21.7 11/27/2019 0817   TCO2 23 11/27/2019 0817   ACIDBASEDEF 6.0 (H) 11/27/2019 0817   O2SAT 70.0 11/27/2019 0817     Coagulation Profile: Recent Labs  Lab 11/25/19 0921  INR 1.3*    Cardiac Enzymes: Recent Labs  Lab 11/25/19 0921  CKTOTAL 94    HbA1C: Hgb A1c MFr Bld  Date/Time Value Ref Range Status  11/25/2019 09:21 AM 6.3 (H) 4.8 - 5.6 % Final    Comment:    (NOTE) Pre diabetes:          5.7%-6.4%  Diabetes:              >6.4%  Glycemic control for   <7.0% adults with diabetes     CBG: Recent Labs  Lab 11/27/19 1930 11/27/19 2343 11/28/19 0020 11/28/19 0343 11/28/19 0707  GLUCAP 126* 92 107* 146* 153*    Review of Systems:   Negative except as noted in HPI  Past Medical History  She,  has a past medical history of Coronary artery disease, Hypertension, Symptomatic anemia (04/2018), and Type II diabetes mellitus ([place]).   Surgical History    Past Surgical History:  Procedure Laterality Date  . BACK SURGERY    . CHOLECYSTECTOMY OPEN  1972  . COLONOSCOPY WITH PROPOFOL N/A 04/28/2018   Procedure: COLONOSCOPY WITH PROPOFOL;  Surgeon: Carol Ada, MD;  Location: [facility];  Service: Endoscopy;  Laterality: N/A;  . CORONARY ANGIOPLASTY WITH STENT PLACEMENT  01/18/2018  . CORONARY STENT INTERVENTION N/A 01/18/2018   Procedure: CORONARY STENT INTERVENTION;  Surgeon: Troy Sine, MD;  Location: [facility];  Service: Cardiovascular;  Laterality: N/A;  . ESOPHAGOGASTRODUODENOSCOPY  02/22/2018  . ESOPHAGOGASTRODUODENOSCOPY (EGD) WITH PROPOFOL N/A 02/22/2018   Procedure: ESOPHAGOGASTRODUODENOSCOPY (EGD) WITH PROPOFOL;  Surgeon: Wilford Corner, MD;  Location: [facility];  Service: Endoscopy;  Laterality: N/A;  . INCISION AND DRAINAGE  2004   "infection had settled in my back"  . LEFT HEART CATH AND CORONARY ANGIOGRAPHY N/A 01/18/2018   Procedure: LEFT HEART CATH AND CORONARY ANGIOGRAPHY;  Surgeon: Troy Sine, MD;  Location: [facility];  Service: Cardiovascular;  Laterality: N/A;  . [place]   ruptured disk  . POLYPECTOMY  04/28/2018   Procedure: POLYPECTOMY;  Surgeon: Carol Ada, MD;  Location[facility] END[facility];  Service: Endoscopy;;  . TONSILLECTOMY       Social History   reports that she quit smoking about 2 years ago. Her smoking use included cigarettes. She has a 40.00 pack-year smoking history. She has never used smokeless tobacco. She reports that she does not drink alcohol and does not use drugs.   Family History   Her family history includes Cancer in her sister; Dementia in her mother; Diabetes in her brother; Heart attack in her brother and brother; Heart disease in her father; Heart  failure in her mother.   Allergies No Known Allergies   The patient is critically ill with multiple organ systems failure and requires high complexity decision making for assessment and support, frequent evaluation and titration of therapies, application of advanced monitoring technologies and extensive interpretation of multiple databases. Critical Care Time devoted to patient care services described in this note independent of APP/resident time (if applicable)  is 40 minutes.   Larey Days MD [facility] Personal pager: [phone] If unanswered, please page CCM On-call: [phone]

## 2019-11-28 NOTE — Progress Notes (Signed)
Initial Nutrition Assessment  DOCUMENTATION CODES:   Obesity unspecified  INTERVENTION:   Initiate tube feeding via Cortrak: Jevity 1.2 at 30 ml/h, increase by 10 ml every 4 hours to goal rate of 60 ml/h (1440 ml per day) Prosource TF 45 ml BID  Provides 1808 kcal, 102 gm protein, 1166 ml free water daily  NUTRITION DIAGNOSIS:   Inadequate oral intake related to lethargy/confusion, acute illness as evidenced by NPO status.  GOAL:   Patient will meet greater than or equal to 90% of their needs  MONITOR:   Labs, TF tolerance, Diet advancement  REASON FOR ASSESSMENT:   Consult (Verbal) Enteral/tube feeding initiation and management  ASSESSMENT:   74 yo female admitted with fatigue, hypotension, UTI. PMH includes CAD, HTN, anemia, GIB, DM-2.   Patient had a poor appetite for several days PTA. She has been NPO since admission due to lethargy, confusion, and respiratory status. Patient has been on BiPAP, transitioned to [place] this AM.   Cortrak placed today, tip in the stomach. Per discussion with MD, okay for RD to order TF.   Labs reviewed. BUN 77, Creat 5.41 CBG: [phone]  Medications reviewed and include solu-cortef, novolog, epinephrine drip. IVF: D5 LR at 100 ml/h  Weight up 8.2 kg since admission. I/O +9 L  Mild edema to all 4 extremities per RN documentation.  NUTRITION - FOCUSED PHYSICAL EXAM:  unable to complete  Diet Order:   Diet Order            Diet NPO time specified  Diet effective now                 EDUCATION NEEDS:   Not appropriate for education at this time  Skin:  Skin Assessment: Skin Integrity Issues: Skin Integrity Issues:: Stage I Stage I: sacrum  Last BM:  8/18 type 6  Height:   Ht Readings from Last 1 Encounters:  11/23/2019 5\' 4"  (1.626 m)    Weight:   Wt Readings from Last 1 Encounters:  11/28/19 101.2 kg   Admission weight 93 kg (BMI=35)  Ideal Body Weight:  54.5 kg  BMI:  Body mass index is 38.3  kg/m.  Estimated Nutritional Needs:   Kcal:  1700-1900  Protein:  90-110 gm  Fluid:  >/= 1.8 L    Lucas Mallow, RD, LDN, CNSC Please refer to Amion for contact information.

## 2019-11-28 NOTE — Procedures (Signed)
Cortrak  Person Inserting Tube:  Esaw Dace, RD Tube Type:  Cortrak - 43 inches Tube Location:  Right nare Initial Placement:  Stomach Secured by: Bridle Technique Used to Measure Tube Placement:  Documented cm marking at nare/ corner of mouth Cortrak Secured At:  65 cm    Cortrak Tube Team Note:  Consult received to place a Cortrak feeding tube.   No x-ray is required. RN may begin using tube.   If the tube becomes dislodged please keep the tube and contact the Cortrak team at [email or web address] ([email or web address]) for replacement.  If after hours and replacement cannot be delayed, place a NG tube and confirm placement with an abdominal x-ray.   Kerman Passey MS, RDN, LDN, CNSC Registered Dietitian III Clinical Nutrition RD Pager and On-Call Pager Number Located in [place]

## 2019-11-28 NOTE — Progress Notes (Signed)
[facility] Progress Note Patient Name: GLADIES SOFRANKO DOB: 1946-03-15 MRN: [id number]   Date of Service  11/28/2019  HPI/Events of Note  No AM lab orders.   eICU Interventions  Plan: 1. CBC with platelets and BMP at 5 AM.     Intervention Category Major Interventions: Other:  Lysle Dingwall 11/28/2019, 4:35 AM

## 2019-11-29 ENCOUNTER — Inpatient Hospital Stay (HOSPITAL_COMMUNITY): Payer: Medicare Other

## 2019-11-29 LAB — CULTURE, BLOOD (ROUTINE X 2)
Culture: NO GROWTH
Culture: NO GROWTH
Special Requests: ADEQUATE

## 2019-11-29 LAB — BASIC METABOLIC PANEL
Anion gap: 10 (ref 5–15)
BUN: 80 mg/dL — ABNORMAL HIGH (ref 8–23)
CO2: 21 mmol/L — ABNORMAL LOW (ref 22–32)
Calcium: 8.2 mg/dL — ABNORMAL LOW (ref 8.9–10.3)
Chloride: 111 mmol/L (ref 98–111)
Creatinine, Ser: 5.76 mg/dL — ABNORMAL HIGH (ref 0.44–1.00)
GFR calc Af Amer: 8 mL/min — ABNORMAL LOW (ref >60)
GFR calc non Af Amer: 7 mL/min — ABNORMAL LOW (ref >60)
Glucose, Bld: 219 mg/dL — ABNORMAL HIGH (ref 70–99)
Potassium: 4.9 mmol/L (ref 3.5–5.1)
Sodium: 142 mmol/L (ref 135–145)

## 2019-11-29 LAB — GLUCOSE, CAPILLARY
Glucose-Capillary: 200 mg/dL — ABNORMAL HIGH (ref 70–99)
Glucose-Capillary: 259 mg/dL — ABNORMAL HIGH (ref 70–99)
Glucose-Capillary: 223 mg/dL — ABNORMAL HIGH (ref 70–99)
Glucose-Capillary: 226 mg/dL — ABNORMAL HIGH (ref 70–99)
Glucose-Capillary: 181 mg/dL — ABNORMAL HIGH (ref 70–99)
Glucose-Capillary: 172 mg/dL — ABNORMAL HIGH (ref 70–99)

## 2019-11-29 NOTE — Progress Notes (Signed)
Pt BIPAP order is continuous, pt settings 18/5 BUR 18 30%. Pt respiratory status is stable at this time, with no distress noted. RT will continue to monitor.

## 2019-11-29 NOTE — Progress Notes (Signed)
NAME:  Karen Cooper, MRN:  [id number], DOB:  06-06-1945, LOS: 4 ADMISSION DATE:  11/11/2019, CONSULTATION DATE:  11/29/19 REFERRING MD:  EDP, CHIEF COMPLAINT:  fatigue   Brief History   74 y.o. F with PMH of CAD, HTN, anemia and GIB, type 2 DM who presented from home with fatigue and hypotension.  She was initially bradycardic and started on Epi gtt, work-up consistent with UTI and PCCM consulted for admission.   History of present illness   74 y.o. F with PMH  of CAD, HTN, anemia and GIB, type 2 DM who was brought in for fatigue and poor appetite.  Per EMS report, pt was found sitting in chair and family reported generalized weakness for one week and poor appetite for several days.    On arrival to the ED, pt was bradycardic with HR in the 20's and hypotensive though awake.  She was started on an epinephrin gtt with improvement.  Work-up significant for urinary tract infection with up-trending lactic acid to 4.2 and creatinine of 4 with baseline ~1.3.   She was given Vancomycin and Cefepime and 1.5L IVF.   On exam, pt is awake, though confused and fatigued.  PCCM consulted for admission  Past Medical History   has a past medical history of Coronary artery disease, Hypertension, Symptomatic anemia (04/2018), and Type II diabetes mellitus ([place]).   Significant Hospital Events   8/15 Admit to PCCM   Consults:    Procedures:  Central venous catheter placement 8/15 Arterial line placement 8/15  Significant Diagnostic Tests:  8/14 CXR>>no acute findings  Micro Data:  8/13 UC>> Klebsiella (amp resistant, nitrofurantoin intermediate, otherwise sensitive) 8/13 BCx2>> negative  Antimicrobials:  Vancomycin 8/14-8/16 Zosyn 8/14.>>8/16 CTX 8/17>>  Interim history/subjective:   Remains on epinephrine drip at 3 In atrial fibrillation Has had some hyperglycemia last 24 hours Slight increase serum creatinine but with increasing urine output 105 >> 270 cc last 24 hours Currently on  BiPAP  Objective   Blood pressure 98/65, pulse 79, temperature (!) 100.8 F (38.2 C), resp. rate 18, height 5\' 4"  (1.626 m), weight 101.6 kg, SpO2 97 %.        Intake/Output Summary (Last 24 hours) at 11/29/2019 0841 Last data filed at 11/29/2019 0800 Gross per 24 hour  Intake 1635.75 ml  Output 270 ml  Net 1365.75 ml   Filed Weights   11/27/19 0500 11/28/19 0408 11/29/19 0317  Weight: 99.5 kg 101.2 kg 101.6 kg    General: Ill-appearing elderly woman, BiPAP in place, soft mitts in place Neuro: Wakes easily, disoriented, dysarthric, did not follow commands, eyes open CV: Irregularly irregular, distant, no murmur PULM: Coarse bilaterally, no wheezing GI: Nondistended, hypoactive bowel sounds present Extremities: Bilateral pitting lower extremity edema Skin: No rash or skin lesions   Resolved Hospital Problem list     Assessment & Plan:    Septic shock secondary to urinary tract infection with K pneumonaie Initially with bradycardia and hypotension requiring epinephrine gtt. Central venous sat 70% reassuring against cardiogenic component. -Ceftriaxone as ordered based on culture and sensitivities -Consider transition epinephrine to norepinephrine if no progress with weaning -Stress dose hydrocortisone as ordered -Careful with IV fluids, echocardiogram with evidence for elevated RA pressure.  Evidence for total body volume overload  Acute respiratory failure, multifactorial due to sepsis, metabolic acidosis, possible volume overload/pulmonary edema -Tolerating BiPAP, not a candidate for intubation/mechanical ventilation -Chest x-ray today and in a.m.  Heart failure with reduced EF 40%: TTE 11/26/19 worse than  0/9983, diastolic dysfunction, reduced EF of RV. -Received Lasix 120 mg on 8/18.  Follow off diuretics 8/19 given marginal renal function  Acute,oliguric kidney failure: Cr continues to rise, UOP marginal. ATN from shock, concern for venous congestion after appropriate  fluid resuscitation  -Tolerated Lasix 8/18, hold on 8/19 consider another dose 8/20 -Poor dialysis candidate  Toxic Metabolic Encephalopathy: septic shock, respiratory failure, ICU delirium -Normalize sleep-wake cycle, light/dark etc. -Haldol available as needed -Steroids may be a contributor, plan to discontinue when hemodynamically improved  History CAD, HTN Angioplasty and stenting in 2019 with HFpEF -Home aspirin, Plavix as ordered  Type 2 DM -Sliding scale insulin -Consider addition to feed coverage 8/19 -Home Metformin on hold    Best practice:  Diet:Cortrak, TF Pain/Anxiety/Delirium protocol (if indicated): n/a VAP protocol (if indicated): n/a DVT prophylaxis: heparin GI prophylaxis: n/a Glucose control: SSI Mobility: bed rest Code Status: DNR Family Communication: updated husband by phone 8/19 Disposition: ICU  Labs   CBC: Recent Labs  Lab 11/25/19 0133 11/25/19 0133 11/25/19 0921 11/25/19 1504 11/26/19 0356 11/26/19 0412 11/27/19 0324 11/27/19 0817 11/28/19 0440  WBC 11.5*  --  18.4*  --  17.8*  --  15.6*  --  15.2*  NEUTROABS  --   --   --   --  15.6*  --  13.8*  --  13.6*  HGB 9.6*   < > 9.3*   < > 8.6* 7.8* 8.1* 8.2* 7.7*  HCT 30.9*   < > 29.9*   < > 27.3* 23.0* 25.9* 24.0* 24.8*  MCV 104.0*  --  103.5*  --  102.6*  --  102.8*  --  102.9*  PLT 141*  --  168  --  127*  --  107*  --  97*   < > = values in this interval not displayed.    Basic Metabolic Panel: Recent Labs  Lab 11/25/19 0025 11/25/19 0046 11/25/19 0921 11/25/19 1504 11/26/19 0356 11/26/19 0412 11/26/19 1537 11/27/19 0324 11/27/19 0817 11/28/19 0440 11/29/19 0500  NA 138   < > 140   < > 140   < > 141 141 143 141 142  K 4.7   < > 5.3*   < > 5.4*   < > 5.2* 5.1 4.8 5.0 4.9  CL 105   < > 106   < > 106  --  108 109  --  109 111  CO2 20*   < > 19*   < > 21*  --  22 21*  --  19* 21*  GLUCOSE 184*   < > 167*   < > 139*  --  100* 173*  --  159* 219*  BUN 81*   < > 79*   < > 77*   --  73* 75*  --  77* 80*  CREATININE 4.10*   < > 4.01*   < > 4.46*  --  4.88* 5.08*  --  5.41* 5.76*  CALCIUM 7.5*   < > 7.8*   < > 7.4*  --  7.6* 7.7*  --  8.2* 8.2*  MG 1.0*  --  1.5*  --   --   --   --   --   --   --   --   PHOS  --   --  5.5*  --   --   --   --   --   --   --   --    < > = values  in this interval not displayed.   GFR: Estimated Creatinine Clearance: 9.6 mL/min (A) (by C-G formula based on SCr of 5.76 mg/dL (H)). Recent Labs  Lab 11/15/2019 2232 11/25/19 0031 11/25/19 0133 11/25/19 0821 11/25/19 0921 11/26/19 0356 11/27/19 0324 11/28/19 0440  WBC  --   --    < >  --  18.4* 17.8* 15.6* 15.2*  LATICACIDVEN 1.3 4.2*  --  1.4  --   --   --   --    < > = values in this interval not displayed.    Liver Function Tests: Recent Labs  Lab 11/25/19 0025  AST 17  ALT 15  ALKPHOS 75  BILITOT 0.9  PROT 4.6*  ALBUMIN 2.2*   No results for input(s): LIPASE, AMYLASE in the last 168 hours. No results for input(s): AMMONIA in the last 168 hours.  ABG    Component Value Date/Time   PHART 7.251 (L) 11/26/2019 0412   PCO2ART 49.5 (H) 11/26/2019 0412   PO2ART 133 (H) 11/26/2019 0412   HCO3 21.7 11/27/2019 0817   TCO2 23 11/27/2019 0817   ACIDBASEDEF 6.0 (H) 11/27/2019 0817   O2SAT 70.0 11/27/2019 0817     Coagulation Profile: Recent Labs  Lab 11/25/19 0921  INR 1.3*    Cardiac Enzymes: Recent Labs  Lab 11/25/19 0921  CKTOTAL 94    HbA1C: Hgb A1c MFr Bld  Date/Time Value Ref Range Status  11/25/2019 09:21 AM 6.3 (H) 4.8 - 5.6 % Final    Comment:    (NOTE) Pre diabetes:          5.7%-6.4%  Diabetes:              >6.4%  Glycemic control for   <7.0% adults with diabetes     CBG: Recent Labs  Lab 11/28/19 1509 11/28/19 1915 11/28/19 2307 11/29/19 0308 11/29/19 0715  GLUCAP 198* 194* 248* 200* 259*    The patient is critically ill with multiple organ systems failure and requires high complexity decision making for assessment and support,  frequent evaluation and titration of therapies, application of advanced monitoring technologies and extensive interpretation of multiple databases. Critical Care Time devoted to patient care services described in this note independent of APP/resident time (if applicable)  is 33 minutes.    Baltazar Apo, MD, PhD 11/29/2019, 8:59 AM [facility] [phone] or if no answer [phone]

## 2019-11-29 NOTE — Progress Notes (Signed)
[facility] Progress Note Patient Name: Karen Cooper DOB: 08-03-1945 MRN: [id number]   Date of Service  11/29/2019  HPI/Events of Note  Patient is having frequent loose stools.  eICU Interventions  Flexiseal ordered.        Kerry Kass Clance Baquero 11/29/2019, 4:22 AM

## 2019-11-30 LAB — POCT I-STAT 7, (LYTES, BLD GAS, ICA,H+H)
Acid-base deficit: 7 mmol/L — ABNORMAL HIGH (ref 0.0–2.0)
Bicarbonate: 22.9 mmol/L (ref 20.0–28.0)
Calcium, Ion: 1.29 mmol/L (ref 1.15–1.40)
HCT: 26 % — ABNORMAL LOW (ref 36.0–46.0)
Hemoglobin: 8.8 g/dL — ABNORMAL LOW (ref 12.0–15.0)
O2 Saturation: 99 %
Potassium: 4.4 mmol/L (ref 3.5–5.1)
Sodium: 146 mmol/L — ABNORMAL HIGH (ref 135–145)
TCO2: 25 mmol/L (ref 22–32)
pCO2 arterial: 72.1 mmHg (ref 32.0–48.0)
pH, Arterial: 7.11 — CL (ref 7.350–7.450)
pO2, Arterial: 203 mmHg — ABNORMAL HIGH (ref 83.0–108.0)

## 2019-11-30 LAB — CBC
HCT: 22.9 % — ABNORMAL LOW (ref 36.0–46.0)
Hemoglobin: 7.1 g/dL — ABNORMAL LOW (ref 12.0–15.0)
MCH: 32 pg (ref 26.0–34.0)
MCHC: 31 g/dL (ref 30.0–36.0)
MCV: 103.2 fL — ABNORMAL HIGH (ref 80.0–100.0)
Platelets: 87 10*3/uL — ABNORMAL LOW (ref 150–400)
RBC: 2.22 MIL/uL — ABNORMAL LOW (ref 3.87–5.11)
RDW: 16.3 % — ABNORMAL HIGH (ref 11.5–15.5)
WBC: 11.8 10*3/uL — ABNORMAL HIGH (ref 4.0–10.5)
nRBC: 0 % (ref 0.0–0.2)

## 2019-11-30 LAB — GLUCOSE, CAPILLARY
Glucose-Capillary: 143 mg/dL — ABNORMAL HIGH (ref 70–99)
Glucose-Capillary: 169 mg/dL — ABNORMAL HIGH (ref 70–99)
Glucose-Capillary: 173 mg/dL — ABNORMAL HIGH (ref 70–99)
Glucose-Capillary: 179 mg/dL — ABNORMAL HIGH (ref 70–99)
Glucose-Capillary: 183 mg/dL — ABNORMAL HIGH (ref 70–99)
Glucose-Capillary: 214 mg/dL — ABNORMAL HIGH (ref 70–99)

## 2019-11-30 LAB — BASIC METABOLIC PANEL
Anion gap: 10 (ref 5–15)
BUN: 95 mg/dL — ABNORMAL HIGH (ref 8–23)
CO2: 19 mmol/L — ABNORMAL LOW (ref 22–32)
Calcium: 7.8 mg/dL — ABNORMAL LOW (ref 8.9–10.3)
Chloride: 113 mmol/L — ABNORMAL HIGH (ref 98–111)
Creatinine, Ser: 6.15 mg/dL — ABNORMAL HIGH (ref 0.44–1.00)
GFR calc Af Amer: 7 mL/min — ABNORMAL LOW (ref >60)
GFR calc non Af Amer: 6 mL/min — ABNORMAL LOW (ref >60)
Glucose, Bld: 203 mg/dL — ABNORMAL HIGH (ref 70–99)
Potassium: 4.4 mmol/L (ref 3.5–5.1)
Sodium: 142 mmol/L (ref 135–145)

## 2019-11-30 LAB — HEPATIC FUNCTION PANEL
ALT: 20 U/L (ref 0–44)
AST: 11 U/L — ABNORMAL LOW (ref 15–41)
Albumin: 2 g/dL — ABNORMAL LOW (ref 3.5–5.0)
Alkaline Phosphatase: 73 U/L (ref 38–126)
Bilirubin, Direct: <0.1 mg/dL (ref 0.0–0.2)
Total Bilirubin: 0.4 mg/dL (ref 0.3–1.2)
Total Protein: 4.6 g/dL — ABNORMAL LOW (ref 6.5–8.1)

## 2019-11-30 LAB — LACTIC ACID, PLASMA: Lactic Acid, Venous: 1 mmol/L (ref 0.5–1.9)

## 2019-11-30 LAB — MAGNESIUM: Magnesium: 2 mg/dL (ref 1.7–2.4)

## 2019-11-30 MED ORDER — HEPARIN SODIUM (PORCINE) 5000 UNIT/ML IJ SOLN
5000.0000 [IU] | Freq: Three times a day (TID) | INTRAMUSCULAR | Status: DC
  Administered 2019-11-30 – 2019-12-01 (×4): 5000 [IU] via SUBCUTANEOUS
  Filled 2019-11-30 (×3): qty 1

## 2019-11-30 MED ORDER — HYDROCORTISONE NA SUCCINATE PF 100 MG IJ SOLR
50.0000 mg | Freq: Two times a day (BID) | INTRAMUSCULAR | Status: DC
  Administered 2019-11-30 – 2019-12-01 (×2): 50 mg via INTRAVENOUS
  Filled 2019-11-30 (×2): qty 2

## 2019-11-30 MED ORDER — INSULIN ASPART 100 UNIT/ML ~~LOC~~ SOLN
3.0000 [IU] | SUBCUTANEOUS | Status: DC
  Administered 2019-11-30 – 2019-12-01 (×8): 3 [IU] via SUBCUTANEOUS

## 2019-11-30 NOTE — Progress Notes (Signed)
NAME:  Karen Cooper, MRN:  [id number], DOB:  21-Nov-1945, LOS: 5 ADMISSION DATE:  11/21/2019, CONSULTATION DATE:  11/30/19 REFERRING MD:  EDP, CHIEF COMPLAINT:  fatigue   Brief History   74 y.o. F with PMH of CAD, HTN, anemia and GIB, type 2 DM who presented from home with fatigue and hypotension.  She was initially bradycardic and started on Epi gtt, work-up consistent with UTI and PCCM consulted for admission.   History of present illness   74 y.o. F with PMH  of CAD, HTN, anemia and GIB, type 2 DM who was brought in for fatigue and poor appetite.  Per EMS report, pt was found sitting in chair and family reported generalized weakness for one week and poor appetite for several days.    On arrival to the ED, pt was bradycardic with HR in the 20's and hypotensive though awake.  She was started on an epinephrin gtt with improvement.  Work-up significant for urinary tract infection with up-trending lactic acid to 4.2 and creatinine of 4 with baseline ~1.3.   She was given Vancomycin and Cefepime and 1.5L IVF.   On exam, pt is awake, though confused and fatigued.  PCCM consulted for admission  Past Medical History   has a past medical history of Coronary artery disease, Hypertension, Symptomatic anemia (04/2018), and Type II diabetes mellitus ([place]).   Significant Hospital Events   8/15 Admit to PCCM   Consults:    Procedures:  Central venous catheter placement 8/15 Arterial line placement 8/15  Significant Diagnostic Tests:  8/14 CXR>>no acute findings  Micro Data:  8/13 UC>> Klebsiella (amp resistant, nitrofurantoin intermediate, otherwise sensitive) 8/13 BCx2>> negative  Antimicrobials:  Vancomycin 8/14-8/16 Zosyn 8/14.>>8/16 CTX 8/17>>  Interim history/subjective:  Epinephrine drip weaned off Remains on BiPAP More interactive Urine output 315 cc last 24 hours (slightly increased)  Objective   Blood pressure (!) 131/43, pulse 92, temperature (!) 97.5 F (36.4 C), resp.  rate 19, height 5\' 4"  (1.626 m), weight 105.4 kg, SpO2 100 %.    FiO2 (%):  [30 %] 30 %   Intake/Output Summary (Last 24 hours) at 11/30/2019 0851 Last data filed at 11/30/2019 0600 Gross per 24 hour  Intake 1008.69 ml  Output 615 ml  Net 393.69 ml   Filed Weights   11/28/19 0408 11/29/19 0317 11/30/19 0238  Weight: 101.2 kg 101.6 kg 105.4 kg    General: Acute and chronically ill appearing, BiPAP in place, safety mitts Neuro: Wakes to voice, opens eyes and appears to track, did respond to questions, moves extremities spontaneously CV: Regular, distant, no murmur PULM: Coarse bilateral breath sounds GI: Nondistended, hypoactive bowel sounds present Extremities: 1+ lower extremity edema Skin: Some erythema, ecchymosis left upper chest and neck   Resolved Hospital Problem list     Assessment & Plan:    Septic shock secondary to urinary tract infection with K pneumonaie Initially with bradycardia and hypotension requiring epinephrine gtt. Central venous sat 70% reassuring against cardiogenic component. Continue ceftriaxone as ordered based on culture data, sensitivities Pressors weaned off Hydrocortisone, stress dose.  Decrease 8/20, consider DC 8/21 if tolerates hemodynamically Careful with IV fluids given evidence for elevated RA pressure on echocardiogram, evidence for total body volume overload  Acute respiratory failure, multifactorial due to sepsis, metabolic acidosis, possible volume overload/pulmonary edema Comfortable respiratory pattern currently on BiPAP.  Will trial off BiPAP this morning, see if she tolerates.  Not a candidate for intubation/mechanical ventilation Follow intermittent chest x-ray, small  left pleural effusion with some basilar volume loss  Heart failure with reduced EF 40%: TTE 11/26/19 worse than 0/6301, diastolic dysfunction, reduced EF of RV. Received Lasix 120 mg on 8/18, deferred on 8/19.  Plan to hold off on 8/20 given her persistent tenuous renal  function and increasing urine output  Acute,oliguric kidney failure: ATN from shock, concern for venous congestion after appropriate fluid resuscitation. Serum creatinine continues to rise 8/20 but urine output increasing.  Hopefully there will be recovery here.  This is issue that would appear to be most tenuous-if she does not have renal recovery then she likely will not survive Continue to follow urine output, serum creatinine off Lasix (received a single dose 8/18).  Hopefully she will have a renal recovery.  I explained to the patient's husband that she is a poor hemodialysis candidate  Toxic Metabolic Encephalopathy: septic shock, respiratory failure, ICU delirium.  Somewhat improved 8/20. Continue to normalize sleep-wake cycle, light/dark, etc. Haldol available if needed, minimize Wean corticosteroids on 8/28, possibly to off on 8/21 as these may be a contributor to delirium, altered mental status.  History CAD, HTN Angioplasty and stenting in 2019 with HFpEF Aspirin, Plavix  Anemia critical illness, superimposed on anemia of chronic disease Follow hemoglobin, consider possible contribution hemodilution.  Tolerating aspirin, Plavix for now  Thrombocytopenia, suspect due to sepsis Follow CBC trend, remains on heparin subcutaneous for DVT prophylaxis Consider DIC or HITT if continues to drop  Type 2 DM Sign scale insulin as ordered Add tube feed coverage 8/20 Holding Metformin     Best practice:  Diet:Cortrak, TF Pain/Anxiety/Delirium protocol (if indicated): n/a VAP protocol (if indicated): n/a DVT prophylaxis: heparin GI prophylaxis: n/a Glucose control: SSI + TF coverage Mobility: bed rest Code Status: DNR Family Communication: updated husband and neighbor by phone on 8/20.  Disposition: ICU  Labs   CBC: Recent Labs  Lab 11/25/19 0921 11/25/19 1504 11/26/19 0356 11/26/19 0356 11/26/19 0412 11/27/19 0324 11/27/19 0817 11/28/19 0440 11/30/19 0356  WBC 18.4*   --  17.8*  --   --  15.6*  --  15.2* 11.8*  NEUTROABS  --   --  15.6*  --   --  13.8*  --  13.6*  --   HGB 9.3*   < > 8.6*   < > 7.8* 8.1* 8.2* 7.7* 7.1*  HCT 29.9*   < > 27.3*   < > 23.0* 25.9* 24.0* 24.8* 22.9*  MCV 103.5*  --  102.6*  --   --  102.8*  --  102.9* 103.2*  PLT 168  --  127*  --   --  107*  --  97* 87*   < > = values in this interval not displayed.    Basic Metabolic Panel: Recent Labs  Lab 11/25/19 0025 11/25/19 0046 11/25/19 0921 11/25/19 1504 11/26/19 1537 11/26/19 1537 11/27/19 0324 11/27/19 0817 11/28/19 0440 11/29/19 0500 11/30/19 0356  NA 138   < > 140   < > 141   < > 141 143 141 142 142  K 4.7   < > 5.3*   < > 5.2*   < > 5.1 4.8 5.0 4.9 4.4  CL 105   < > 106   < > 108  --  109  --  109 111 113*  CO2 20*   < > 19*   < > 22  --  21*  --  19* 21* 19*  GLUCOSE 184*   < > 167*   < >  100*  --  173*  --  159* 219* 203*  BUN 81*   < > 79*   < > 73*  --  75*  --  77* 80* 95*  CREATININE 4.10*   < > 4.01*   < > 4.88*  --  5.08*  --  5.41* 5.76* 6.15*  CALCIUM 7.5*   < > 7.8*   < > 7.6*  --  7.7*  --  8.2* 8.2* 7.8*  MG 1.0*  --  1.5*  --   --   --   --   --   --   --  2.0  PHOS  --   --  5.5*  --   --   --   --   --   --   --   --    < > = values in this interval not displayed.   GFR: Estimated Creatinine Clearance: 9.2 mL/min (A) (by C-G formula based on SCr of 6.15 mg/dL (H)). Recent Labs  Lab 11/11/2019 2232 11/25/19 0031 11/25/19 0133 11/25/19 4742 11/25/19 0921 11/26/19 0356 11/27/19 0324 11/28/19 0440 11/30/19 0356  WBC  --   --    < >  --    < > 17.8* 15.6* 15.2* 11.8*  LATICACIDVEN 1.3 4.2*  --  1.4  --   --   --   --  1.0   < > = values in this interval not displayed.    Liver Function Tests: Recent Labs  Lab 11/25/19 0025 11/30/19 0356  AST 17 11*  ALT 15 20  ALKPHOS 75 73  BILITOT 0.9 0.4  PROT 4.6* 4.6*  ALBUMIN 2.2* 2.0*   No results for input(s): LIPASE, AMYLASE in the last 168 hours. No results for input(s): AMMONIA in the  last 168 hours.  ABG    Component Value Date/Time   PHART 7.251 (L) 11/26/2019 0412   PCO2ART 49.5 (H) 11/26/2019 0412   PO2ART 133 (H) 11/26/2019 0412   HCO3 21.7 11/27/2019 0817   TCO2 23 11/27/2019 0817   ACIDBASEDEF 6.0 (H) 11/27/2019 0817   O2SAT 70.0 11/27/2019 0817     Coagulation Profile: Recent Labs  Lab 11/25/19 0921  INR 1.3*    Cardiac Enzymes: Recent Labs  Lab 11/25/19 0921  CKTOTAL 94    HbA1C: Hgb A1c MFr Bld  Date/Time Value Ref Range Status  11/25/2019 09:21 AM 6.3 (H) 4.8 - 5.6 % Final    Comment:    (NOTE) Pre diabetes:          5.7%-6.4%  Diabetes:              >6.4%  Glycemic control for   <7.0% adults with diabetes     CBG: Recent Labs  Lab 11/29/19 1532 11/29/19 1910 11/29/19 2311 11/30/19 0351 11/30/19 0719  GLUCAP 226* 181* 172* 173* 183*    The patient is critically ill with multiple organ systems failure and requires high complexity decision making for assessment and support, frequent evaluation and titration of therapies, application of advanced monitoring technologies and extensive interpretation of multiple databases. Critical Care Time devoted to patient care services described in this note independent of APP/resident time (if applicable) is 40 minutes.    Baltazar Apo, MD, PhD 11/30/2019, 8:51 AM [facility] [phone] or if no answer [phone]

## 2019-11-30 NOTE — Progress Notes (Signed)
Critical ABG results given to Dr. Lamonte Sakai. Pt placed back on previous Bipap settings per MD request. RT will continue to monitor.

## 2019-11-30 NOTE — Plan of Care (Signed)

## 2019-12-01 DIAGNOSIS — G9341 Metabolic encephalopathy: Secondary | ICD-10-CM

## 2019-12-01 DIAGNOSIS — B9689 Other specified bacterial agents as the cause of diseases classified elsewhere: Secondary | ICD-10-CM

## 2019-12-01 DIAGNOSIS — N390 Urinary tract infection, site not specified: Secondary | ICD-10-CM

## 2019-12-01 DIAGNOSIS — J9602 Acute respiratory failure with hypercapnia: Secondary | ICD-10-CM

## 2019-12-01 DIAGNOSIS — J9601 Acute respiratory failure with hypoxia: Secondary | ICD-10-CM

## 2019-12-01 LAB — POCT I-STAT 7, (LYTES, BLD GAS, ICA,H+H)
Acid-base deficit: 4 mmol/L — ABNORMAL HIGH (ref 0.0–2.0)
Bicarbonate: 21.8 mmol/L (ref 20.0–28.0)
Calcium, Ion: 1.26 mmol/L (ref 1.15–1.40)
HCT: 19 % — ABNORMAL LOW (ref 36.0–46.0)
Hemoglobin: 6.5 g/dL — CL (ref 12.0–15.0)
O2 Saturation: 98 %
Potassium: 4.3 mmol/L (ref 3.5–5.1)
Sodium: 147 mmol/L — ABNORMAL HIGH (ref 135–145)
TCO2: 23 mmol/L (ref 22–32)
pCO2 arterial: 45.6 mmHg (ref 32.0–48.0)
pH, Arterial: 7.287 — ABNORMAL LOW (ref 7.350–7.450)
pO2, Arterial: 123 mmHg — ABNORMAL HIGH (ref 83.0–108.0)

## 2019-12-01 LAB — BASIC METABOLIC PANEL
Anion gap: 12 (ref 5–15)
BUN: 109 mg/dL — ABNORMAL HIGH (ref 8–23)
CO2: 20 mmol/L — ABNORMAL LOW (ref 22–32)
Calcium: 8.2 mg/dL — ABNORMAL LOW (ref 8.9–10.3)
Chloride: 113 mmol/L — ABNORMAL HIGH (ref 98–111)
Creatinine, Ser: 6.3 mg/dL — ABNORMAL HIGH (ref 0.44–1.00)
GFR calc Af Amer: 7 mL/min — ABNORMAL LOW (ref >60)
GFR calc non Af Amer: 6 mL/min — ABNORMAL LOW (ref >60)
Glucose, Bld: 193 mg/dL — ABNORMAL HIGH (ref 70–99)
Potassium: 4.4 mmol/L (ref 3.5–5.1)
Sodium: 145 mmol/L (ref 135–145)

## 2019-12-01 LAB — MAGNESIUM: Magnesium: 1.9 mg/dL (ref 1.7–2.4)

## 2019-12-01 LAB — GLUCOSE, CAPILLARY
Glucose-Capillary: 200 mg/dL — ABNORMAL HIGH (ref 70–99)
Glucose-Capillary: 177 mg/dL — ABNORMAL HIGH (ref 70–99)
Glucose-Capillary: 230 mg/dL — ABNORMAL HIGH (ref 70–99)

## 2019-12-01 MED ORDER — GLYCOPYRROLATE 0.2 MG/ML IJ SOLN: 0.2000 mg | INTRAMUSCULAR | Status: DC | PRN

## 2019-12-01 MED ORDER — MORPHINE SULFATE (CONCENTRATE) 10 MG/0.5ML PO SOLN
5.0000 mg | ORAL | Status: DC | PRN
  Filled 2019-12-01: qty 0.5

## 2019-12-01 MED ORDER — LORAZEPAM 2 MG/ML PO CONC: 1.0000 mg | ORAL | Status: DC | PRN

## 2019-12-01 MED ORDER — ONDANSETRON 4 MG PO TBDP: 4.0000 mg | ORAL_TABLET | Freq: Four times a day (QID) | ORAL | Status: DC | PRN

## 2019-12-01 MED ORDER — FUROSEMIDE 10 MG/ML IJ SOLN
100.0000 mg | Freq: Once | INTRAVENOUS | Status: AC
  Administered 2019-12-01: 100 mg via INTRAVENOUS
  Filled 2019-12-01: qty 10

## 2019-12-01 MED ORDER — HYDROCORTISONE NA SUCCINATE PF 100 MG IJ SOLR: 50.0000 mg | Freq: Every day | INTRAMUSCULAR | Status: DC

## 2019-12-01 MED ORDER — HALOPERIDOL LACTATE 5 MG/ML IJ SOLN
0.5000 mg | INTRAMUSCULAR | Status: DC | PRN
  Filled 2019-12-01: qty 1

## 2019-12-01 MED ORDER — HALOPERIDOL 0.5 MG PO TABS
0.5000 mg | ORAL_TABLET | ORAL | Status: DC | PRN
  Filled 2019-12-01: qty 1

## 2019-12-01 MED ORDER — HALOPERIDOL LACTATE 2 MG/ML PO CONC
0.5000 mg | ORAL | Status: DC | PRN
  Filled 2019-12-01: qty 0.3

## 2019-12-01 MED ORDER — SODIUM CHLORIDE 0.9% IV SOLUTION: Freq: Once | INTRAVENOUS | Status: DC

## 2019-12-01 MED ORDER — LORAZEPAM 1 MG PO TABS: 1.0000 mg | ORAL_TABLET | ORAL | Status: DC | PRN

## 2019-12-01 MED ORDER — LORAZEPAM 2 MG/ML IJ SOLN
1.0000 mg | INTRAMUSCULAR | Status: DC | PRN
  Administered 2019-12-01: 1 mg via INTRAVENOUS
  Filled 2019-12-01: qty 1

## 2019-12-01 MED ORDER — ONDANSETRON HCL 4 MG/2ML IJ SOLN: 4.0000 mg | Freq: Four times a day (QID) | INTRAMUSCULAR | Status: DC | PRN

## 2019-12-01 MED ORDER — MORPHINE SULFATE (CONCENTRATE) 10 MG/0.5ML PO SOLN: 5.0000 mg | ORAL | Status: DC | PRN

## 2019-12-01 MED ORDER — GLYCOPYRROLATE 1 MG PO TABS
1.0000 mg | ORAL_TABLET | ORAL | Status: DC | PRN
  Filled 2019-12-01: qty 1

## 2019-12-01 NOTE — Progress Notes (Signed)
eLink Physician-Brief Progress Note Patient Name: Karen Cooper DOB: [birth year] MRN: [id number]   Date of Service  12/01/2019  HPI/Events of Note  Anemia - Hgb = 6.6.   eICU Interventions  Transfuse 1 unit PRBC now.      Intervention Category Major Interventions: Other:  Greely Atiyeh Cornelia Copa 12/01/2019, 7:02 AM

## 2019-12-01 NOTE — Progress Notes (Signed)
Pt taken off Bipap at this time and placed on 2L nasal cannula per Dr. Tacy Learn request. Pt is now full comfort care. RT will continue to monitor and be available as needed.

## 2019-12-01 NOTE — Progress Notes (Signed)
Karen Cooper is a 74 y.o. female patient admitted. Awake, alert - respond to voice - no acute distress noted.  VSS - Blood pressure (!) 89/35, pulse (!) 55, temperature (!) 97.4 F (36.3 C), temperature source Oral, resp. rate 16, height 5\' 4"  (1.626 m), weight 105.8 kg, SpO2 100 %.    IV in place, occlusive dsg intact without redness. Flexiseal in place. Foley draing yellow /straw urine.  Daughter at bedside and Orientation to room, and floor completed.  Admission INP armband ID verified with patient/family, and in place.   SR up x 2, fall assessment complete, with patient and family able to verbalize understanding of risk associated with falls, and verbalized understanding to call nsg before up out of bed.  Call light within reach,   Admission nurse notified of admission.     Will cont to eval and treat per MD orders.  Hezzie Bump, RN 12/01/2019 8:52 PM

## 2019-12-01 NOTE — Progress Notes (Signed)
NAME:  Karen Cooper, MRN:  [id number], DOB:  1946/02/10, LOS: 6 ADMISSION DATE:  11/27/2019, CONSULTATION DATE:  12/01/19 REFERRING MD:  EDP, CHIEF COMPLAINT:  fatigue   Brief History   74 y.o. F with PMH of CAD, HTN, anemia and GIB, type 2 DM who presented from home with fatigue and hypotension.  She was initially bradycardic and started on Epi gtt, work-up consistent with Klebsiella UTI and PCCM consulted for admission.   Past Medical History   has a past medical history of Coronary artery disease, Hypertension, Symptomatic anemia (04/2018), and Type II diabetes mellitus (Karen Cooper).   Significant Hospital Events     Consults:  PCCM Palliative care  Procedures:  Central venous catheter placement 8/15 Arterial line placement 8/15  Significant Diagnostic Tests:  8/14 CXR>>no acute findings  Micro Data:  8/13 UC>> Klebsiella (amp resistant, nitrofurantoin intermediate, otherwise sensitive) 8/13 BCx2>> negative  Antimicrobials:  Vancomycin 8/14-8/16 Zosyn 8/14.>>8/16 CTX 8/17>> 8/21  Interim history/subjective:  Patient remains off vasopressors, but remains on BiPAP due to hypoxic/hypercapnic respiratory failure Urine output has been decreasing, it was 75 cc in last 24 hours with worsening serum creatinine Objective   Blood pressure (!) 119/50, pulse 82, temperature 98.2 F (36.8 C), resp. rate 16, height 5\' 4"  (1.626 m), weight 105.8 kg, SpO2 97 %.    FiO2 (%):  [30 %] 30 %   Intake/Output Summary (Last 24 hours) at 12/01/2019 1441 Last data filed at 12/01/2019 0855 Gross per 24 hour  Intake 720 ml  Output 395 ml  Net 325 ml   Filed Weights   11/29/19 0317 11/30/19 0238 12/01/19 0500  Weight: 101.6 kg 105.4 kg 105.8 kg    General: Acute and chronically ill appearing, BiPAP in place, safety mitts Neuro: Wakes to voice, opens eyes does not follow commands, moving all 4 extremities spontaneously  CV: Regular, distant, no murmur PULM: Coarse bilateral breath  sounds GI: Nondistended, hypoactive bowel sounds present Extremities: 1+ lower extremity edema Skin: Some erythema, ecchymosis left upper chest and neck   Resolved Hospital Problem list   Septic shock  Assessment & Plan:  Septic shock, improved Remains off vasopressors  Urinary tract infection with K pneumonaie Patient's urine culture grew Klebsiella Initially she was on vancomycin and Zosyn, then antibiotics were switched to ceftriaxone She completed total of 7 days antibiotic therapy Hydrocortisone, stress dose was stopped  Acute hypoxic/hypercapnic respiratory failure Multifactorial due to sepsis, metabolic acidosis, possible volume overload/pulmonary edema Unfortunately is difficult for her to come off of BiPAP  Yesterday BiPAP was taken off, she developed acute respiratory acidosis Currently she is on BiPAP again, able to tolerate well   Acute on chronic systolic heart failure with reduced EF 40%:  She was given Lasix 100 mg x 1 today, she put out only 70 cc of urine Monitor intake and output  Acute,oliguric kidney failure: ATN from shock Patient serum creatinine continue to get worse Also her urine output is decreasing, she is almost anuric Lasix challenge did not improve her urine output  Acute toxic/ Metabolic Encephalopathy: septic shock, respiratory failure, ICU delirium.   Haldol available if needed, minimize Continue supportive care  Anemia critical illness, superimposed on anemia of chronic disease Follow hemoglobin, consider possible contribution hemodilution.  Tolerating aspirin, Plavix for now  Type 2 DM Sign scale insulin as ordered Add tube feed coverage 8/20 Holding Metformin  I had detailed discussion with patient's husband Mr. Karen Cooper and patient's son Karen Cooper.  Explained patient's condition  including acute hypoxic/hypercapnic respiratory failure and worsening renal failure.  After discussion family decided not to proceed with hemodialysis  and remove BiPAP and keep patient comfortable as much as possible without escalation of therapy. Patient is DNR/DNI and comfort care only.     Best practice:  Diet:Cortrak, TF Pain/Anxiety/Delirium protocol (if indicated): n/a VAP protocol (if indicated): n/a DVT prophylaxis: heparin GI prophylaxis: n/a Glucose control: SSI + TF coverage Mobility: bed rest Code Status: DNR Family Communication: Updated patient's husband and son Disposition:  Labs   CBC: Recent Labs  Lab 11/26/19 0356 11/26/19 0412 11/27/19 0324 11/27/19 0817 11/28/19 0440 11/30/19 0356 11/30/19 1601 12/01/19 0500 12/01/19 0858  WBC 17.8*  --  15.6*  --  15.2* 11.8*  --  9.9  --   NEUTROABS 15.6*  --  13.8*  --  13.6*  --   --   --   --   HGB 8.6*   < > 8.1*   < > 7.7* 7.1* 8.8* 6.6* 6.5*  HCT 27.3*   < > 25.9*   < > 24.8* 22.9* 26.0* 21.5* 19.0*  MCV 102.6*  --  102.8*  --  102.9* 103.2*  --  103.4*  --   PLT 127*  --  107*  --  97* 87*  --  98*  --    < > = values in this interval not displayed.    Basic Metabolic Panel: Recent Labs  Lab 11/25/19 0025 11/25/19 0046 11/25/19 0921 11/25/19 1504 11/27/19 0324 11/27/19 0817 11/28/19 0440 11/28/19 0440 11/29/19 0500 11/30/19 0356 11/30/19 1601 12/01/19 0500 12/01/19 0858  NA 138   < > 140   < > 141   < > 141   < > 142 142 146* 145 147*  K 4.7   < > 5.3*   < > 5.1   < > 5.0   < > 4.9 4.4 4.4 4.4 4.3  CL 105   < > 106   < > 109  --  109  --  111 113*  --  113*  --   CO2 20*   < > 19*   < > 21*  --  19*  --  21* 19*  --  20*  --   GLUCOSE 184*   < > 167*   < > 173*  --  159*  --  219* 203*  --  193*  --   BUN 81*   < > 79*   < > 75*  --  77*  --  80* 95*  --  109*  --   CREATININE 4.10*   < > 4.01*   < > 5.08*  --  5.41*  --  5.76* 6.15*  --  6.30*  --   CALCIUM 7.5*   < > 7.8*   < > 7.7*  --  8.2*  --  8.2* 7.8*  --  8.2*  --   MG 1.0*  --  1.5*  --   --   --   --   --   --  2.0  --  1.9  --   PHOS  --   --  5.5*  --   --   --   --   --   --    --   --   --   --    < > = values in this interval not displayed.   GFR: Estimated Creatinine Clearance: 9 mL/min (A) (by C-G formula based  on SCr of 6.3 mg/dL (H)). Recent Labs  Lab 12/02/2019 2232 11/25/19 0031 11/25/19 0133 11/25/19 4174 11/25/19 0921 11/27/19 0324 11/28/19 0440 11/30/19 0356 12/01/19 0500  WBC  --   --    < >  --    < > 15.6* 15.2* 11.8* 9.9  LATICACIDVEN 1.3 4.2*  --  1.4  --   --   --  1.0  --    < > = values in this interval not displayed.    Liver Function Tests: Recent Labs  Lab 11/25/19 0025 11/30/19 0356  AST 17 11*  ALT 15 20  ALKPHOS 75 73  BILITOT 0.9 0.4  PROT 4.6* 4.6*  ALBUMIN 2.2* 2.0*   No results for input(s): LIPASE, AMYLASE in the last 168 hours. No results for input(s): AMMONIA in the last 168 hours.  ABG    Component Value Date/Time   PHART 7.287 (L) 12/01/2019 0858   PCO2ART 45.6 12/01/2019 0858   PO2ART 123 (H) 12/01/2019 0858   HCO3 21.8 12/01/2019 0858   TCO2 23 12/01/2019 0858   ACIDBASEDEF 4.0 (H) 12/01/2019 0858   O2SAT 98.0 12/01/2019 0858     Coagulation Profile: Recent Labs  Lab 11/25/19 0921  INR 1.3*    Cardiac Enzymes: Recent Labs  Lab 11/25/19 0921  CKTOTAL 94    HbA1C: Hgb A1c MFr Bld  Date/Time Value Ref Range Status  11/25/2019 09:21 AM 6.3 (H) 4.8 - 5.6 % Final    Comment:    (NOTE) Pre diabetes:          5.7%-6.4%  Diabetes:              >6.4%  Glycemic control for   <7.0% adults with diabetes     CBG: Recent Labs  Lab 11/30/19 1905 11/30/19 2331 12/01/19 0318 12/01/19 0733 12/01/19 1118  GLUCAP 169* 214* 200* 177* 230*    Total critical care time: 49 minutes  Performed by: [place] care time was exclusive of separately billable procedures and treating other patients.   Critical care was necessary to treat or prevent imminent or life-threatening deterioration.   Critical care was time spent personally by me on the following activities: development  of treatment plan with patient and/or surrogate as well as nursing, discussions with consultants, evaluation of patient's response to treatment, examination of patient, obtaining history from patient or surrogate, ordering and performing treatments and interventions, ordering and review of laboratory studies, ordering and review of radiographic studies, pulse oximetry and re-evaluation of patient's condition.   Jacky Kindle MD Critical care physician [facility]  Pager: [phone] Mobile: [phone]

## 2019-12-01 NOTE — Progress Notes (Signed)
Called [address] [place] and gave report to Sharla Kidney, Therapist, sports. Patient will go to room 18

## 2019-12-01 NOTE — Progress Notes (Signed)
ABG results given to Dr. Tacy Learn. No new orders at this time. RT will continue to monitor.

## 2019-12-02 DIAGNOSIS — I9589 Other hypotension: Secondary | ICD-10-CM

## 2019-12-02 DIAGNOSIS — R001 Bradycardia, unspecified: Secondary | ICD-10-CM

## 2019-12-02 LAB — CBC
HCT: 21.5 % — ABNORMAL LOW (ref 36.0–46.0)
Hemoglobin: 6.6 g/dL — CL (ref 12.0–15.0)
MCH: 31.7 pg (ref 26.0–34.0)
MCHC: 30.7 g/dL (ref 30.0–36.0)
MCV: 103.4 fL — ABNORMAL HIGH (ref 80.0–100.0)
Platelets: 98 10*3/uL — ABNORMAL LOW (ref 150–400)
RBC: 2.08 MIL/uL — ABNORMAL LOW (ref 3.87–5.11)
RDW: 16.4 % — ABNORMAL HIGH (ref 11.5–15.5)
WBC: 9.9 10*3/uL (ref 4.0–10.5)
nRBC: 0.2 % (ref 0.0–0.2)

## 2019-12-02 MED ORDER — GLYCOPYRROLATE 1 MG PO TABS
1.0000 mg | ORAL_TABLET | ORAL | Status: DC | PRN
  Filled 2019-12-02: qty 1

## 2019-12-02 MED ORDER — GLYCOPYRROLATE 0.2 MG/ML IJ SOLN: 0.2000 mg | INTRAMUSCULAR | Status: DC | PRN

## 2019-12-02 MED ORDER — ACETAMINOPHEN 325 MG PO TABS: 650.0000 mg | ORAL_TABLET | Freq: Four times a day (QID) | ORAL | Status: DC | PRN

## 2019-12-02 MED ORDER — ONDANSETRON 4 MG PO TBDP: 4.0000 mg | ORAL_TABLET | Freq: Four times a day (QID) | ORAL | Status: DC | PRN

## 2019-12-02 MED ORDER — POLYVINYL ALCOHOL 1.4 % OP SOLN
1.0000 [drp] | Freq: Four times a day (QID) | OPHTHALMIC | Status: DC | PRN
  Filled 2019-12-02: qty 15

## 2019-12-02 MED ORDER — HALOPERIDOL LACTATE 5 MG/ML IJ SOLN: 0.5000 mg | INTRAMUSCULAR | Status: DC | PRN

## 2019-12-02 MED ORDER — ONDANSETRON HCL 4 MG/2ML IJ SOLN: 4.0000 mg | Freq: Four times a day (QID) | INTRAMUSCULAR | Status: DC | PRN

## 2019-12-02 MED ORDER — ACETAMINOPHEN 650 MG RE SUPP: 650.0000 mg | Freq: Four times a day (QID) | RECTAL | Status: DC | PRN

## 2019-12-02 MED ORDER — HALOPERIDOL LACTATE 2 MG/ML PO CONC
0.5000 mg | ORAL | Status: DC | PRN
  Filled 2019-12-02: qty 0.3

## 2019-12-02 MED ORDER — MORPHINE BOLUS VIA INFUSION
2.0000 mg | INTRAVENOUS | Status: DC | PRN
  Filled 2019-12-02: qty 2

## 2019-12-02 MED ORDER — BIOTENE DRY MOUTH MT LIQD: 15.0000 mL | OROMUCOSAL | Status: DC | PRN

## 2019-12-02 MED ORDER — HALOPERIDOL 0.5 MG PO TABS
0.5000 mg | ORAL_TABLET | ORAL | Status: DC | PRN
  Filled 2019-12-02: qty 1

## 2019-12-02 MED ORDER — MORPHINE 100MG IN NS 100ML (1MG/ML) PREMIX INFUSION
1.0000 mg/h | INTRAVENOUS | Status: DC
  Administered 2019-12-02: 1 mg/h via INTRAVENOUS
  Filled 2019-12-02: qty 100

## 2019-12-02 NOTE — Progress Notes (Signed)
PROGRESS NOTE    Karen Cooper  JME:[id number] DOB: 13-Feb-1946 DOA: 11/16/2019 PCP: Holland Commons, FNP    Brief Narrative:  Patient admitted to the hospital working diagnosis septic shock due to Klebsiella pneumonia urinary tract infection, complicated with acute hypoxic/hypercapnic respiratory failure.   74 year old female with past medical history for coronary artery disease, hypertension, anemia, gastrointestinal bleeding, type 2 diabetes mellitus.  She presented with fatigue and poor appetite.  Symptoms ongoing for about a week, associated with poor oral intake.  On her initial physical examination she was bradycardic down to the 20s and hypotensive.  She was placed on epinephrine infusion with improvement of hemodynamics.  Her blood pressure 66/47, heart rate 59, temperature 86.7, respiratory rate 19, oxygen saturation 96%, lungs were clear to auscultation bilaterally, heart S1-S2, present rhythmic, bradycardic, abdomen soft, no lower extremity edema, she was somnolent but following commands.  Patient received broad-spectrum antibiotic therapy, along with vasopressors with improvement of her hemodynamics.  She developed acute respiratory failure, with hypoxemia and hypercapnia, that required noninvasive mechanical ventilation.  Positive worsening kidney function, with ATN related to shock.  Positive decompensated diastolic heart failure.  Patient with very poor prognosis and rapidly deteriorating, family decided to continue care under comfort measures, no renal replacement therapy, no further non invasive mechanical ventilation.  This am patient is not responsive, with agonal respirations and upper extremities twitching movements. Her family is at the beside   Assessment & Plan:   Active Problems:   Hypotension   AKI (acute kidney injury) ([place])   Bradycardia   Septic shock (HCC)   Pressure injury of skin   1. Septic shock due to Klebsiella pneumonia urinary tract infection.  Patient continue to be hypotensive and bradycardic, now with agonal breathing. Imminent death.   Patient with very poor prognosis, will continue care under comfort measures. Change IV morphine to continuous infusion to improve symptom control. Continue with as needed haldol and lorazepam.  Neuro checks per unit protocol. Patient not candidate for non invasive mechanical ventilation. No current indication of antibiotic therapy in the setting of poor prognosis and comfort care.   2. Acute hypoxic and hypercapnic respiratory failure/ Non invasive mechanical ventilation has been discontinued due to poor prognosis/ comfort measures. Continue symptom management with analgesics and sedatives.   3. AKI with ATN/ hypernatremia/ non anion gap metabolic acidosis/ hyperchloremia/ hypomagnesemia. No further blood work indicated. Patient with very poor prognosis on comfort measures, not candidate for renal replacement therapy. Continue symptom management.   4. Severe anemia. Hgb down to 6,5 and Hct 19,0. Not candidate for prbc transfusion in the setting of poor prognosis and comfort measures.   5. Metabolic and toxic encephalopathy. Patient is not responsive, continue comfort care.   6. T2DM uncontrolled with hyperglycemia. Patient unresponsive with no po intake, will hold on further insulin therapy and capillary glucose monitoring.   Patient with imminent death, on comfort measures.   7. Stage 1 pressure ulcer at the sacrum present on admission. Continue with local wound care.   Status is: Inpatient  Remains inpatient appropriate because:IV treatments appropriate due to intensity of illness or inability to take PO   Dispo: The patient is from: Home              Anticipated d/c is to: imminent death              Anticipated d/c date is: 1 day              Patient  currently is not medically stable to d/c.   DVT prophylaxis: None   Code Status:    dnr   Family Communication:  I spoke with patient's  children at the bedside, we talked in detail about patient's condition, plan of care and prognosis and all questions were addressed.      Nutrition Status: Nutrition Problem: Inadequate oral intake Etiology: lethargy/confusion, acute illness Signs/Symptoms: NPO status Interventions: Tube feeding     Skin Documentation: Pressure Injury 11/25/19 Sacrum Medial Stage 1 -  Intact skin with non-blanchable redness of a localized area usually over a bony prominence. (Active)  11/25/19 0300  Location: Sacrum  Location Orientation: Medial  Staging: Stage 1 -  Intact skin with non-blanchable redness of a localized area usually over a bony prominence.  Wound Description (Comments):   Present on Admission:      Pressure Injury 11/28/19 Nose Deep Tissue Pressure Injury - Purple or maroon localized area of discolored intact skin or blood-filled blister due to damage of underlying soft tissue from pressure and/or shear. (Active)  11/28/19 0730  Location: Nose  Location Orientation:   Staging: Deep Tissue Pressure Injury - Purple or maroon localized area of discolored intact skin or blood-filled blister due to damage of underlying soft tissue from pressure and/or shear.  Wound Description (Comments):   Present on Admission:       Subjective: Patient with agonal breathing, not able to give any history, her children are at the bedside   Objective: Vitals:   12/01/19 1500 12/01/19 1600 12/01/19 1838 12/01/19 2025  BP:   (!) 112/57 (!) 89/35  Pulse:  (!) 105 (!) 107 (!) 55  Resp: 17 (!) 21 (!) 22 16  Temp: 98.2 F (36.8 C) 98.2 F (36.8 C) 97.8 F (36.6 C) (!) 97.4 F (36.3 C)  TempSrc:   Oral Oral  SpO2:  100% 100% 100%  Weight:      Height:        Intake/Output Summary (Last 24 hours) at 12/02/2019 0929 Last data filed at 12/02/2019 0538 Gross per 24 hour  Intake --  Output 400 ml  Net -400 ml   Filed Weights   11/29/19 0317 11/30/19 0238 12/01/19 0500  Weight: 101.6 kg 105.4  kg 105.8 kg    Examination:   General: deconditioned and ill looking appearing, positive agonal breathing Neurology: not responsive, positive jerking movements of upper extremities.  E ENT: positve pallor, no icterus, oral mucosa dry Cardiovascular: No JVD. S1-S2 present, rhythmic, no gallops, rubs, or murmurs. No lower extremity edema. Pulmonary: positive breath sounds bilaterally, positive rhonchi, decreased air movement Gastrointestinal. Abdomen soft and distended, non tender Skin. No rashes Musculoskeletal: no joint deformities     Data Reviewed: I have personally reviewed following labs and imaging studies  CBC: Recent Labs  Lab 11/26/19 0356 11/26/19 0412 11/27/19 0324 11/27/19 0817 11/28/19 0440 11/30/19 0356 11/30/19 1601 12/01/19 0500 12/01/19 0858  WBC 17.8*  --  15.6*  --  15.2* 11.8*  --  9.9  --   NEUTROABS 15.6*  --  13.8*  --  13.6*  --   --   --   --   HGB 8.6*   < > 8.1*   < > 7.7* 7.1* 8.8* 6.6* 6.5*  HCT 27.3*   < > 25.9*   < > 24.8* 22.9* 26.0* 21.5* 19.0*  MCV 102.6*  --  102.8*  --  102.9* 103.2*  --  103.4*  --   PLT 127*  --  107*  --  97* 87*  --  98*  --    < > = values in this interval not displayed.   Basic Metabolic Panel: Recent Labs  Lab 11/27/19 0324 11/27/19 0817 11/28/19 0440 11/28/19 0440 11/29/19 0500 11/30/19 0356 11/30/19 1601 12/01/19 0500 12/01/19 0858  NA 141   < > 141   < > 142 142 146* 145 147*  K 5.1   < > 5.0   < > 4.9 4.4 4.4 4.4 4.3  CL 109  --  109  --  111 113*  --  113*  --   CO2 21*  --  19*  --  21* 19*  --  20*  --   GLUCOSE 173*  --  159*  --  219* 203*  --  193*  --   BUN 75*  --  77*  --  80* 95*  --  109*  --   CREATININE 5.08*  --  5.41*  --  5.76* 6.15*  --  6.30*  --   CALCIUM 7.7*  --  8.2*  --  8.2* 7.8*  --  8.2*  --   MG  --   --   --   --   --  2.0  --  1.9  --    < > = values in this interval not displayed.   GFR: Estimated Creatinine Clearance: 9 mL/min (A) (by C-G formula based on SCr of  6.3 mg/dL (H)). Liver Function Tests: Recent Labs  Lab 11/30/19 0356  AST 11*  ALT 20  ALKPHOS 73  BILITOT 0.4  PROT 4.6*  ALBUMIN 2.0*   No results for input(s): LIPASE, AMYLASE in the last 168 hours. No results for input(s): AMMONIA in the last 168 hours. Coagulation Profile: No results for input(s): INR, PROTIME in the last 168 hours. Cardiac Enzymes: No results for input(s): CKTOTAL, CKMB, CKMBINDEX, TROPONINI in the last 168 hours. BNP (last 3 results) No results for input(s): PROBNP in the last 8760 hours. HbA1C: No results for input(s): HGBA1C in the last 72 hours. CBG: Recent Labs  Lab 11/30/19 1905 11/30/19 2331 12/01/19 0318 12/01/19 0733 12/01/19 1118  GLUCAP 169* 214* 200* 177* 230*   Lipid Profile: No results for input(s): CHOL, HDL, LDLCALC, TRIG, CHOLHDL, LDLDIRECT in the last 72 hours. Thyroid Function Tests: No results for input(s): TSH, T4TOTAL, FREET4, T3FREE, THYROIDAB in the last 72 hours. Anemia Panel: No results for input(s): VITAMINB12, FOLATE, FERRITIN, TIBC, IRON, RETICCTPCT in the last 72 hours.    Radiology Studies: I have reviewed all of the imaging during this hospital visit personally     Scheduled Meds:  sodium chloride   Intravenous Once   aspirin  81 mg Per Tube Daily   chlorhexidine  15 mL Mouth Rinse BID   Chlorhexidine Gluconate Cloth  6 each Topical Daily   clopidogrel  75 mg Per Tube Daily   feeding supplement (PROSource TF)  45 mL Per Tube BID   insulin aspart  0-15 Units Subcutaneous Q4H   insulin aspart  3 Units Subcutaneous Q4H   mouth rinse  15 mL Mouth Rinse q12n4p   pantoprazole (PROTONIX) IV  40 mg Intravenous Q24H   sodium chloride flush  10-40 mL Intracatheter Q12H   Continuous Infusions:  sodium chloride     feeding supplement (JEVITY 1.2 CAL) 1,000 mL (11/30/19 1844)     LOS: 7 days        Gloriann Riede Gerome Apley, MD

## 2019-12-03 NOTE — Progress Notes (Signed)
PROGRESS NOTE    Karen Cooper  YNW:[id number]  DOB: 27-Nov-1945  DOA: 11/28/2019 PCP: Holland Commons, FNP Outpatient Specialists:   Hospital course:  74 year old female was admitted 11/23/2020 with septic shock due to Klebsiella pneumonia and UTI.  She was treated aggressively however has developed worsened kidney function and decompensated heart failure with worsened functioning.  Patient was transitioned to comfort care yesterday and is presently on a morphine drip.   Subjective:  Patient's daughter-in-law was at bedside.  She states she also was present when her father passed away on comfort care.  Notes she feels that her mother-in-law is comfortable and they are comfortable with the process.   Objective: Vitals:   12/01/19 2025 12/02/19 2011 12/03/19 0511 12/03/19 1355  BP: (!) 89/35 (!) 80/46 (!) 79/24 (!) 80/33  Pulse: (!) 55 (!) 58 (!) 55 63  Resp: 16 16 14 12   Temp: (!) 97.4 F (36.3 C) 97.9 F (36.6 C) 97.7 F (36.5 C)   TempSrc: Oral     SpO2: 100% 100% 99% 94%  Weight:      Height:        Intake/Output Summary (Last 24 hours) at 12/03/2019 1838 Last data filed at 12/03/2019 1506 Gross per 24 hour  Intake 25.35 ml  Output 130 ml  Net -104.65 ml   Filed Weights   11/29/19 0317 11/30/19 0238 12/01/19 0500  Weight: 101.6 kg 105.4 kg 105.8 kg     Exam:  General: Patient lying comfortably in bed in no acute distress   Assessment & Plan:   Patient on comfort care, actively dying Patient appears comfortable on morphine drip Family at bedside are comfortable with process.   DVT prophylaxis: N/A Code Status: DNR Family Communication: Patient's daughter-in-law is at bedside Disposition Plan:   Patient is from: Home  Anticipated Discharge Location: Patient will likely die in house, she is actively dying  Consultants:  Pulmonary critical care    Data Reviewed:  Basic Metabolic Panel: Recent Labs  Lab 11/27/19 0324 11/27/19 0817  11/28/19 0440 11/28/19 0440 11/29/19 0500 11/30/19 0356 11/30/19 1601 12/01/19 0500 12/01/19 0858  NA 141   < > 141   < > 142 142 146* 145 147*  K 5.1   < > 5.0   < > 4.9 4.4 4.4 4.4 4.3  CL 109  --  109  --  111 113*  --  113*  --   CO2 21*  --  19*  --  21* 19*  --  20*  --   GLUCOSE 173*  --  159*  --  219* 203*  --  193*  --   BUN 75*  --  77*  --  80* 95*  --  109*  --   CREATININE 5.08*  --  5.41*  --  5.76* 6.15*  --  6.30*  --   CALCIUM 7.7*  --  8.2*  --  8.2* 7.8*  --  8.2*  --   MG  --   --   --   --   --  2.0  --  1.9  --    < > = values in this interval not displayed.   Liver Function Tests: Recent Labs  Lab 11/30/19 0356  AST 11*  ALT 20  ALKPHOS 73  BILITOT 0.4  PROT 4.6*  ALBUMIN 2.0*   No results for input(s): LIPASE, AMYLASE in the last 168 hours. No results for input(s): AMMONIA in the last 168  hours. CBC: Recent Labs  Lab 11/27/19 0324 11/27/19 0817 11/28/19 0440 11/30/19 0356 11/30/19 1601 12/01/19 0500 12/01/19 0858  WBC 15.6*  --  15.2* 11.8*  --  9.9  --   NEUTROABS 13.8*  --  13.6*  --   --   --   --   HGB 8.1*   < > 7.7* 7.1* 8.8* 6.6* 6.5*  HCT 25.9*   < > 24.8* 22.9* 26.0* 21.5* 19.0*  MCV 102.8*  --  102.9* 103.2*  --  103.4*  --   PLT 107*  --  97* 87*  --  98*  --    < > = values in this interval not displayed.   Cardiac Enzymes: No results for input(s): CKTOTAL, CKMB, CKMBINDEX, TROPONINI in the last 168 hours. BNP (last 3 results) No results for input(s): PROBNP in the last 8760 hours. CBG: Recent Labs  Lab 11/30/19 1905 11/30/19 2331 12/01/19 0318 12/01/19 0733 12/01/19 1118  GLUCAP 169* 214* 200* 177* 230*    Recent Results (from the past 240 hour(s))  Blood culture (routine x 2)     Status: None   Collection Time: 12/02/2019  9:38 PM   Specimen: BLOOD  Result Value Ref Range Status   Specimen Description BLOOD LEFT ARM  Final   Special Requests   Final    BOTTLES DRAWN AEROBIC AND ANAEROBIC Blood Culture results  may not be optimal due to an inadequate volume of blood received in culture bottles   Culture   Final    NO GROWTH 5 DAYS Performed at [facility], [place] [address], [place], [place] [address]    Report Status 11/29/2019 FINAL  Final  Blood culture (routine x 2)     Status: None   Collection Time: 11/15/2019  9:48 PM   Specimen: BLOOD  Result Value Ref Range Status   Specimen Description BLOOD LEFT HAND  Final   Special Requests   Final    BOTTLES DRAWN AEROBIC AND ANAEROBIC Blood Culture adequate volume   Culture   Final    NO GROWTH 5 DAYS Performed at [facility], [place] [address], [place], [place] [address]    Report Status 11/29/2019 FINAL  Final  SARS Coronavirus 2 by RT PCR (hospital order, performed in [facility] hospital lab) Nasopharyngeal Nasopharyngeal Swab     Status: None   Collection Time: 11/29/2019  9:50 PM   Specimen: Nasopharyngeal Swab  Result Value Ref Range Status   SARS Coronavirus 2 NEGATIVE NEGATIVE Final    Comment: (NOTE) SARS-CoV-2 target nucleic acids are NOT DETECTED.  The SARS-CoV-2 RNA is generally detectable in upper and lower respiratory specimens during the acute phase of infection. The lowest concentration of SARS-CoV-2 viral copies this assay can detect is 250 copies / mL. A negative result does not preclude SARS-CoV-2 infection and should not be used as the sole basis for treatment or other patient management decisions.  A negative result may occur with improper specimen collection / handling, submission of specimen other than nasopharyngeal swab, presence of viral mutation(s) within the areas targeted by this assay, and inadequate number of viral copies (<250 copies / mL). A negative result must be combined with clinical observations, patient history, and epidemiological information.  Fact Sheet for Patients:   StrictlyIdeas.no  Fact Sheet for Healthcare  Providers: BankingDealers.co.za  This test is not yet approved or  cleared by the [place] FDA and has been authorized for detection and/or diagnosis of SARS-CoV-2  by FDA under an Emergency Use Authorization (EUA).  This EUA will remain in effect (meaning this test can be used) for the duration of the COVID-19 declaration under Section 564(b)(1) of the Act, 21 U.S.C. section 360bbb-3(b)(1), unless the authorization is terminated or revoked sooner.  Performed at [facility], [place] [address] [place], [place] [address]   Urine culture     Status: Abnormal   Collection Time: 11/18/2019 11:40 PM   Specimen: Urine, Clean Catch  Result Value Ref Range Status   Specimen Description URINE, CLEAN CATCH  Final   Special Requests   Final    NONE Performed at [facility], [place] [address], [place], [place] [address]    Culture >=100,000 COLONIES/mL KLEBSIELLA PNEUMONIAE (A)  Final   Report Status 11/27/2019 FINAL  Final   Organism ID, Bacteria KLEBSIELLA PNEUMONIAE (A)  Final      Susceptibility   Klebsiella pneumoniae - MIC*    AMPICILLIN >=32 RESISTANT Resistant     CEFAZOLIN <=4 SENSITIVE Sensitive     CEFTRIAXONE <=0.25 SENSITIVE Sensitive     CIPROFLOXACIN <=0.25 SENSITIVE Sensitive     GENTAMICIN <=1 SENSITIVE Sensitive     IMIPENEM <=0.25 SENSITIVE Sensitive     NITROFURANTOIN 64 INTERMEDIATE Intermediate     TRIMETH/SULFA <=20 SENSITIVE Sensitive     AMPICILLIN/SULBACTAM 8 SENSITIVE Sensitive     PIP/TAZO <=4 SENSITIVE Sensitive     * >=100,000 COLONIES/mL KLEBSIELLA PNEUMONIAE  MRSA PCR Screening     Status: None   Collection Time: 11/25/19  2:53 AM   Specimen: Nasal Mucosa; Nasopharyngeal  Result Value Ref Range Status   MRSA by PCR NEGATIVE NEGATIVE Final    Comment:        The GeneXpert MRSA Assay (FDA approved for NASAL specimens only), is one component of a comprehensive MRSA colonization surveillance program. It is not intended  to diagnose MRSA infection nor to guide or monitor treatment for MRSA infections. Performed at [facility], [place] [address], [place], [place] [address]       Studies: No results found.   Scheduled Meds: . sodium chloride   Intravenous Once  . chlorhexidine  15 mL Mouth Rinse BID  . Chlorhexidine Gluconate Cloth  6 each Topical Daily  . sodium chloride flush  10-40 mL Intracatheter Q12H   Continuous Infusions: . sodium chloride    . morphine 1 mg/hr (12/03/19 1506)    Active Problems:   Hypotension   AKI (acute kidney injury) ([place])   Bradycardia   Septic shock ([place])   Pressure injury of skin     Marliss Buttacavoli Tublu Amil Moseman, [facility]  If 7PM-7AM, please contact night-coverage [email or web address] Password [facility] 12/03/2019, 6:38 PM    LOS: 8 days

## 2019-12-03 NOTE — Progress Notes (Signed)
Nutrition Brief Note  Chart reviewed. Pt now transitioning to comfort care.  No further nutrition interventions warranted at this time.  Please re-consult as needed.   Ivon Roedel W, RD, LDN, CDCES Registered Dietitian II Certified Diabetes Care and Education Specialist Please refer to AMION for RD and/or RD on-call/weekend/after hours pager  

## 2019-12-12 NOTE — Progress Notes (Signed)
Patient expired at 1758,Another RN made ware of the death. Md paged and notified. Son called and made aware as well. [place] [facility] called with referral number [phone], patient is a possible tissue donor per Basilio Cairo. Post mortem care done.

## 2019-12-12 NOTE — Death Summary Note (Signed)
[facility] Death Note                                                                                                                                                                                               Karen Cooper, is a 74 y.o. female, DOB - 17-Oct-1945, NID:[id number]  Admit date - 12/09/2019   Admitting Physician Jorje Guild, MD  Outpatient Primary MD for the patient is Karen Cooper, [place]  LOS - 9  Chief Complaint  Patient presents with  . Fatigue  . Hypotension       Notification: Holland Commons, [place] notified of death of December 28, 2019   Date and Time of Death Dec 28, 2019 at [place]  Pronounced by - RN Corky Crafts  History of present illness:   Karen Cooper is a 74 y.o. female with a history of 74 year old female with past medical history for coronary artery disease, hypertension, anemia, gastrointestinal bleeding, type 2 diabetes mellitus.  She presented with fatigue and poor appetite.  Symptoms ongoing for about a week, associated with poor oral intake.  On her initial physical examination she was bradycardic down to the 20s and hypotensive.  She was placed on epinephrine infusion with improvement of hemodynamics.  Her blood pressure 66/47, heart rate 59, temperature 86.7, respiratory rate 19, oxygen saturation 96%, lungs were clear to auscultation bilaterally, heart S1-S2, present rhythmic, bradycardic, abdomen soft, no lower extremity edema, she was somnolent but following commands.  Patient received broad-spectrum antibiotic therapy, along with vasopressors with improvement of her hemodynamics.  She developed acute respiratory failure, with hypoxemia and hypercapnia, that required noninvasive mechanical ventilation.  Positive worsening kidney function, with ATN related to shock.  Positive decompensated diastolic heart failure.  Patient with very poor prognosis and rapidly deteriorating, family decided to continue care under  comfort measures, no renal replacement therapy, no further non invasive mechanical ventilation.   Final Diagnoses:  Cause if death - Renal failure secondary to septic shock and decompensated heart failure secondary to Klebsiella pneumonia and UTI  Signature  Vashti Hey M.D on 28-Dec-2019 at 6:35 PM  [facility]  Office Phone [phone]  Total clinical and documentation time for today Under 30 minutes   Last Note

## 2019-12-12 NOTE — Progress Notes (Signed)
Morphine drip wasted about 43ml, witnessed by Gaspar Bidding, RN

## 2019-12-12 DEATH — deceased

## 2019-12-28 IMAGING — DX DG CHEST 2V
2 series · 2 of 2 positions shown · non-contrast
Comparison: Prior chest x-ray 03/20/2015

CLINICAL DATA: 72-year-old female with shortness of breath on
exertion

EXAM:
CHEST - 2 VIEW

[chest pa]
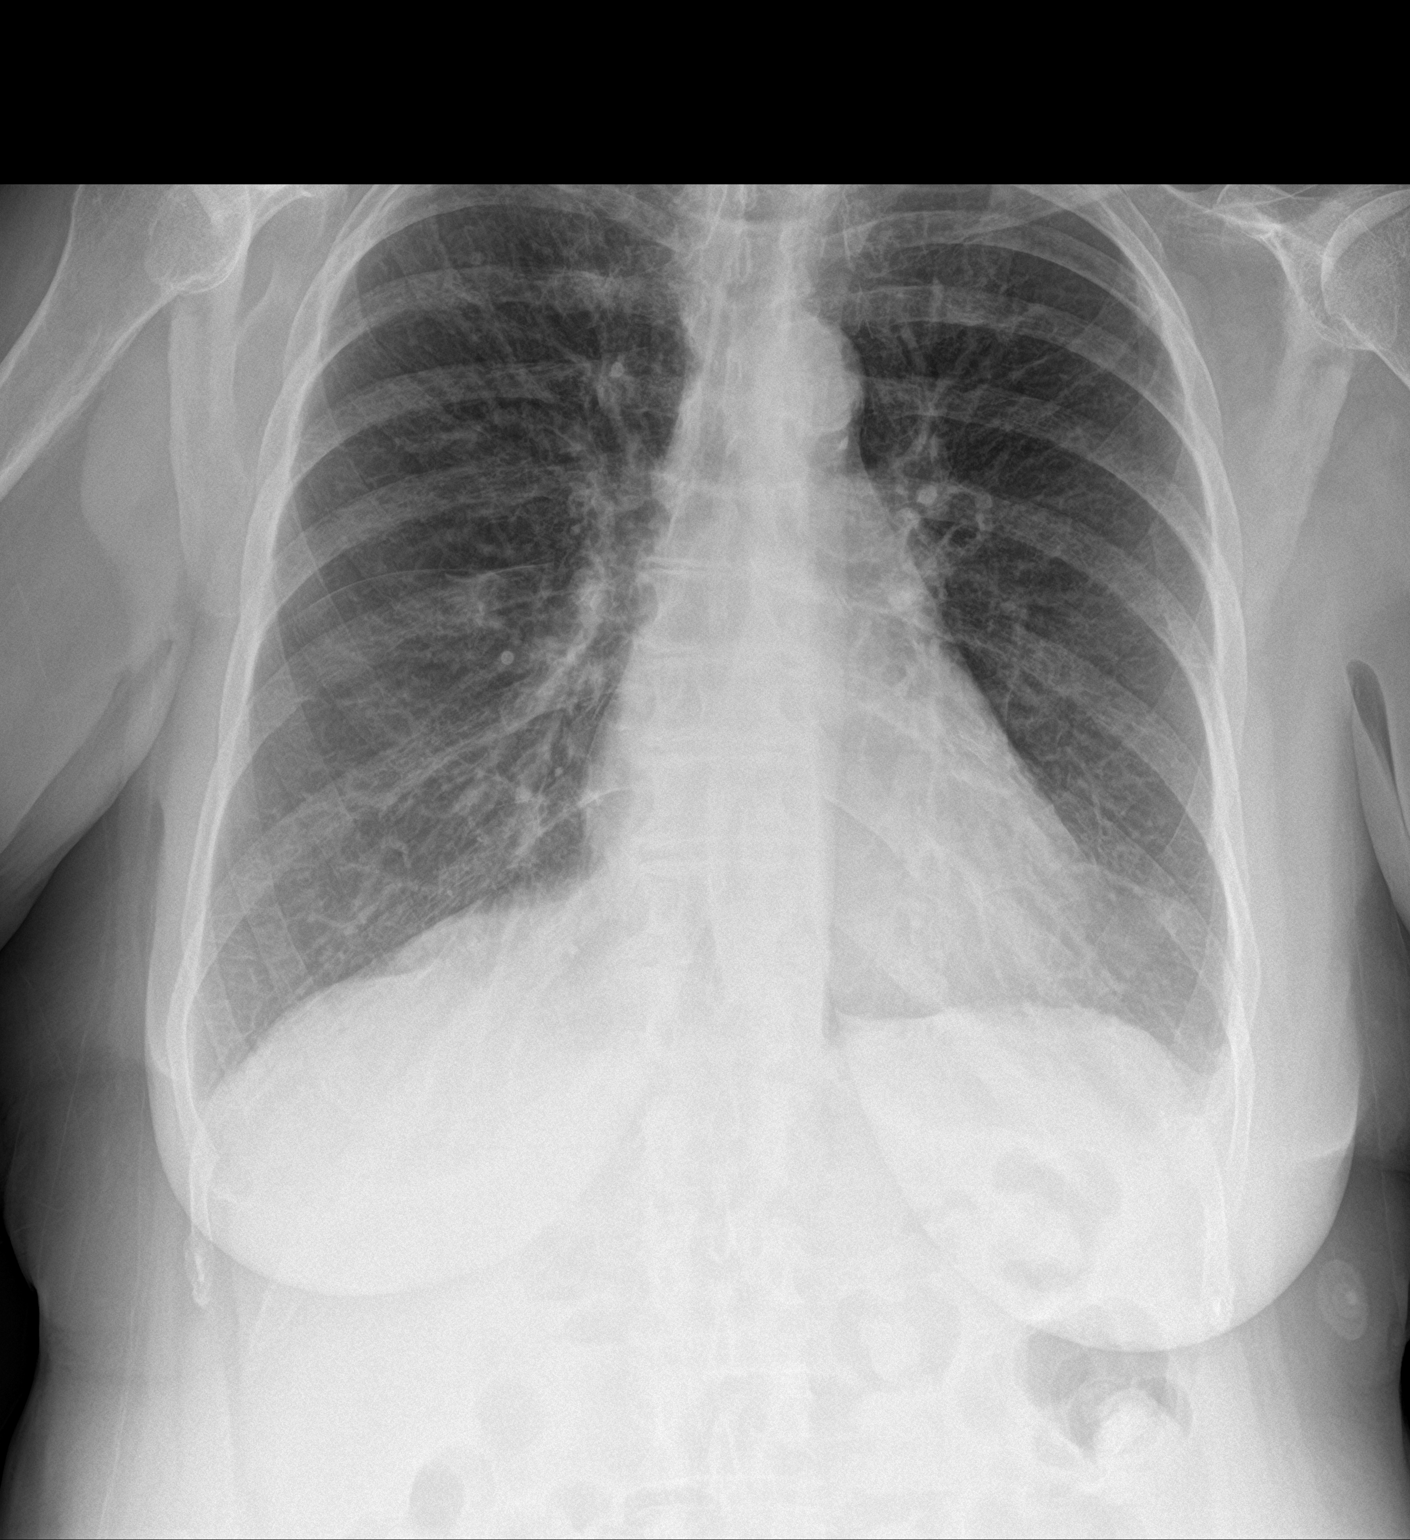

[chest lat]
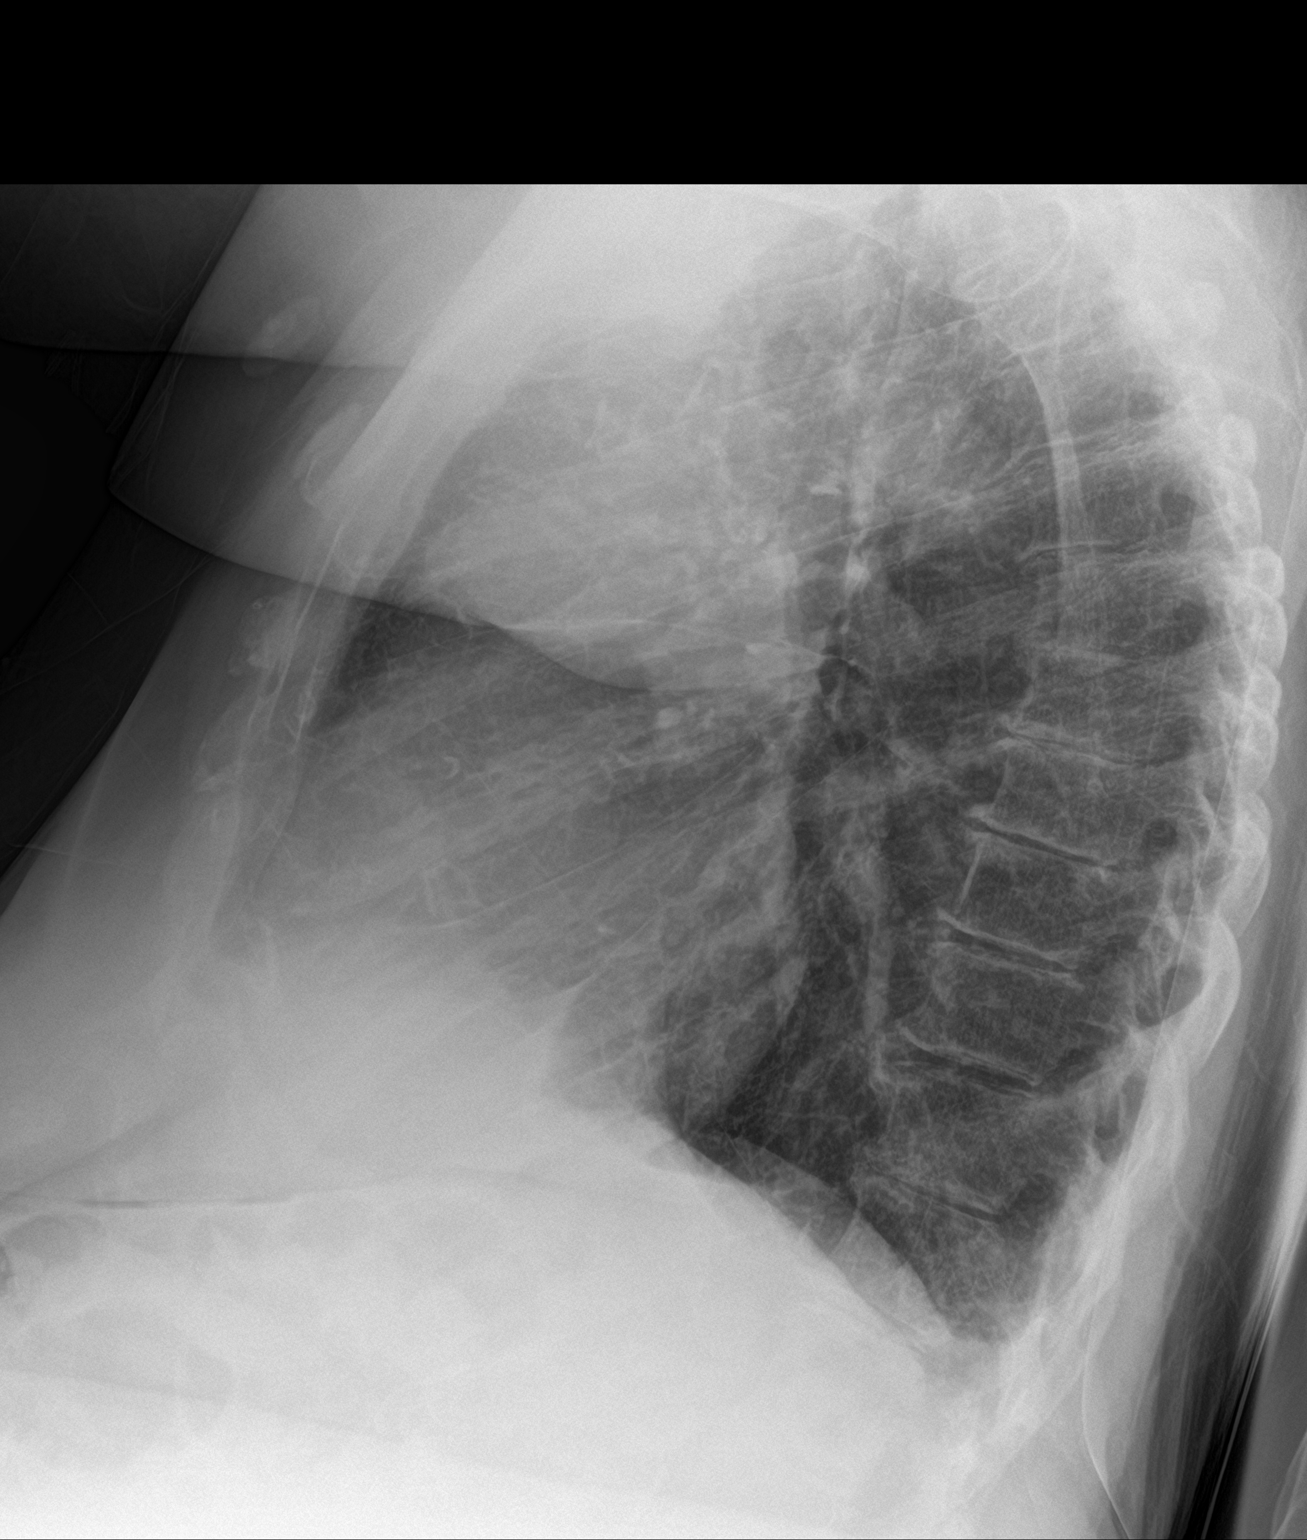

[2 of 2 positions shown; findings below may reference images not displayed]

FINDINGS: Cardiac and mediastinal contours are within normal limits.
Atherosclerotic calcifications are present in the transverse aorta.
Stable background bronchitic changes and mild interstitial
prominence. There is blunting of the cardio phrenic angles on
lateral view suggesting either small bilateral pleural effusions or
bibasilar atelectasis. Otherwise, no focal airspace opacity. No
acute osseous abnormality.
IMPRESSION: 1. Trace bilateral pleural effusions versus lower lobe atelectasis.
2. Otherwise, stable chest x-ray without evidence of acute
cardiopulmonary process.

## 2021-07-28 IMAGING — CT CT RENAL STONE PROTOCOL
2 of 4 series · 16 of 46 positions shown, 18 images · non-contrast
Comparison: None.

CLINICAL DATA: Acute renal failure

EXAM:
CT ABDOMEN AND PELVIS WITHOUT CONTRAST
TECHNIQUE: Multidetector CT imaging of the abdomen and pelvis was performed
following the standard protocol without IV contrast.

[Series 3: renal stone 5.0 · axial · 0.98mm/px · z∈[+1060,+1495]mm · 13 of 97 slices shown, 15 images]
[im 5/97  soft-tissue]
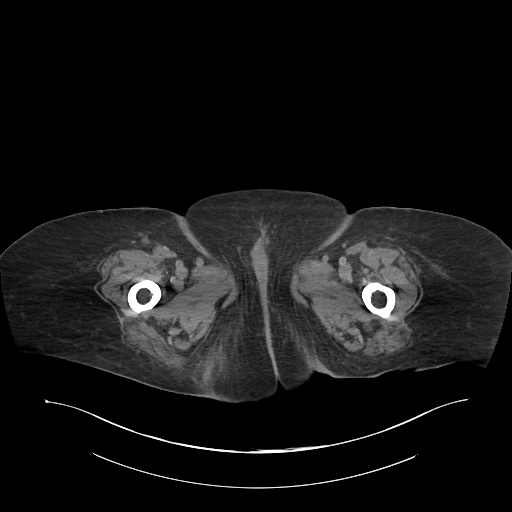
[im 5/97  bone]
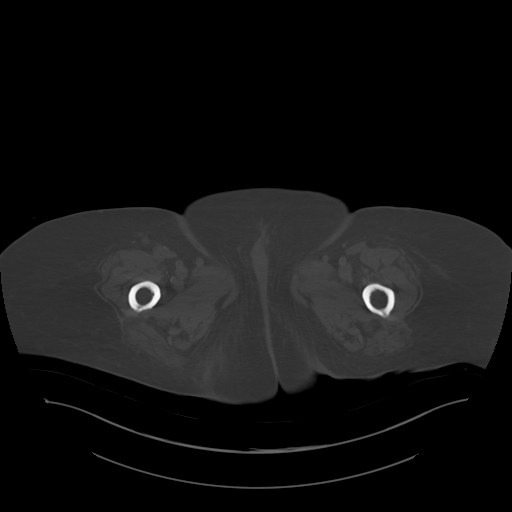
[im 13/97  soft-tissue]
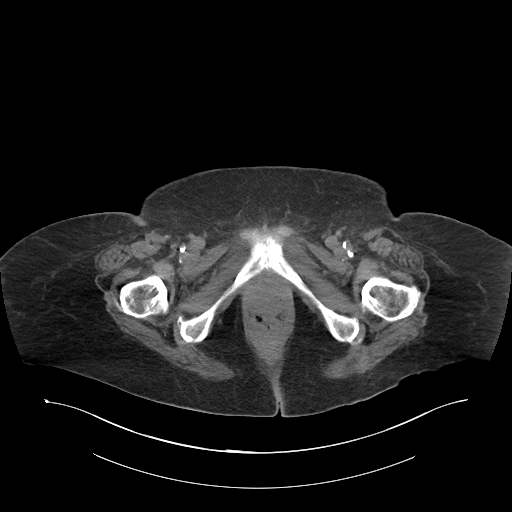
[im 21/97  soft-tissue]
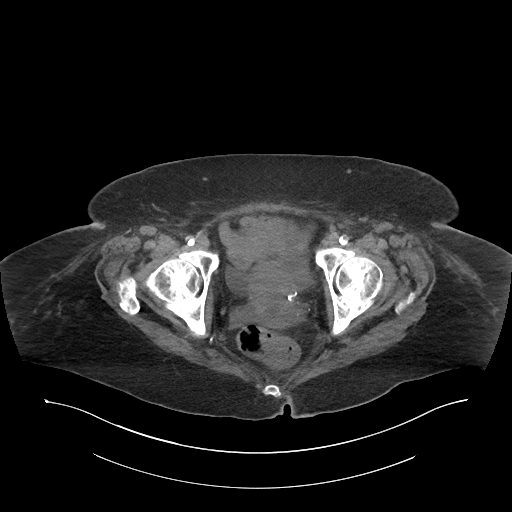
[im 26/97  soft-tissue]
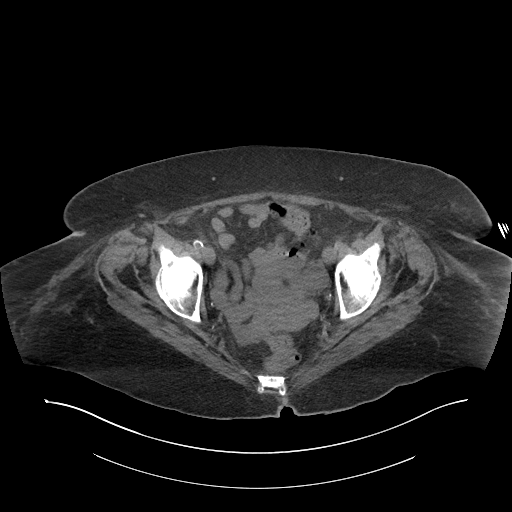
[im 34/97  soft-tissue]
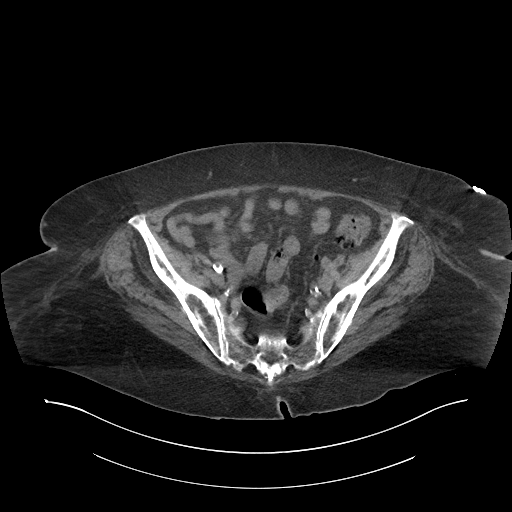
[im 42/97  soft-tissue]
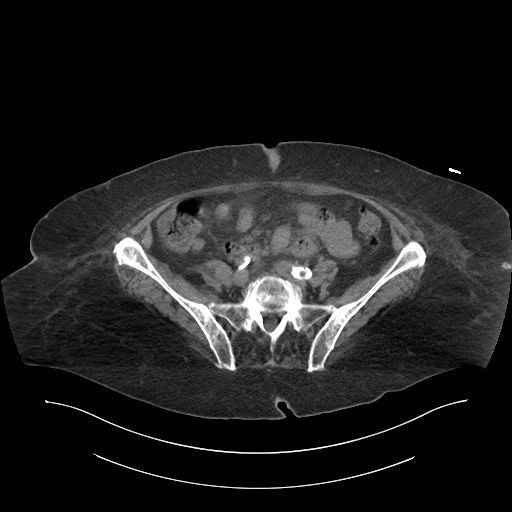
[im 51/97  soft-tissue]
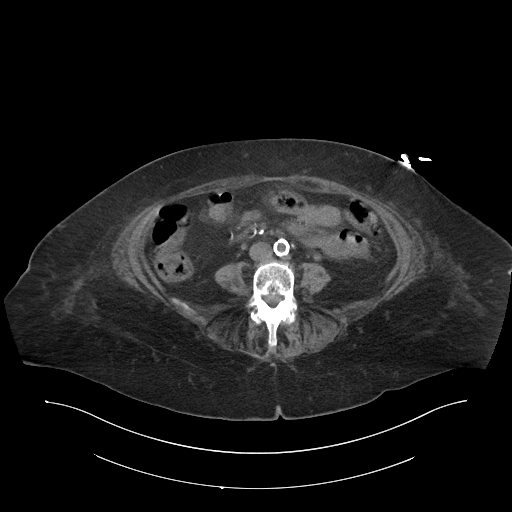
[im 55/97  soft-tissue]
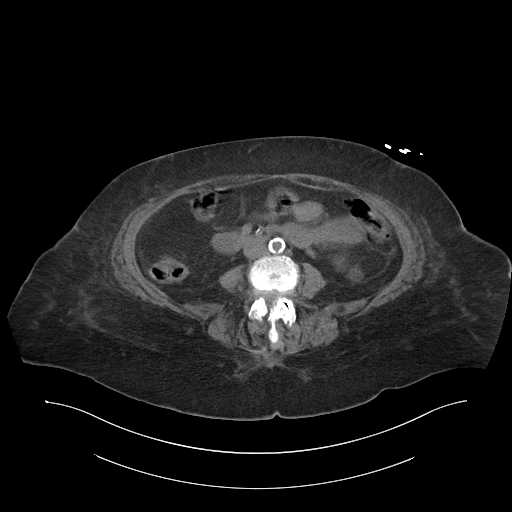
[im 63/97  soft-tissue]
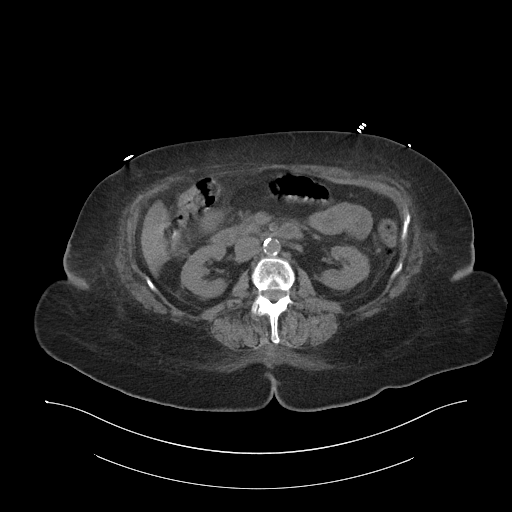
[im 63/97  bone]
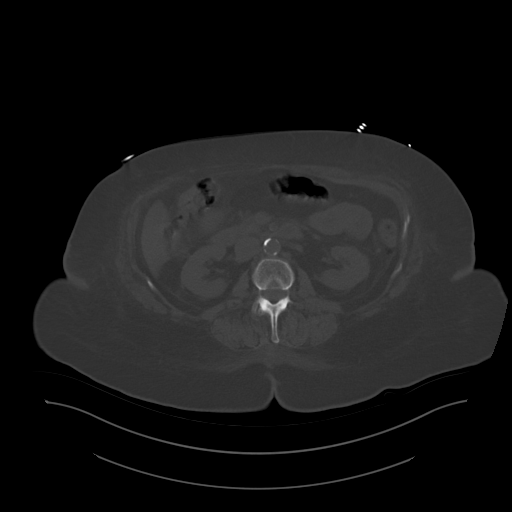
[im 71/97  soft-tissue]
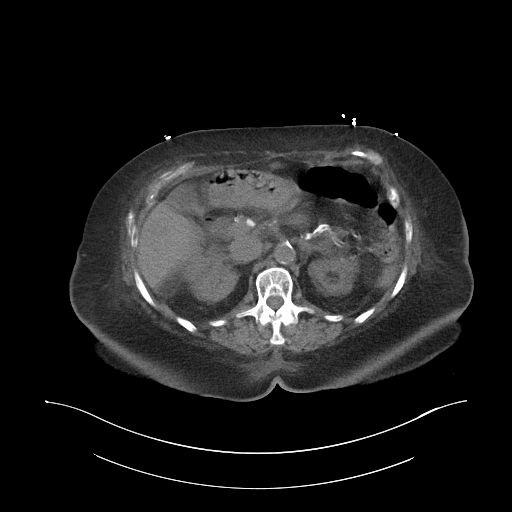
[im 76/97  soft-tissue]
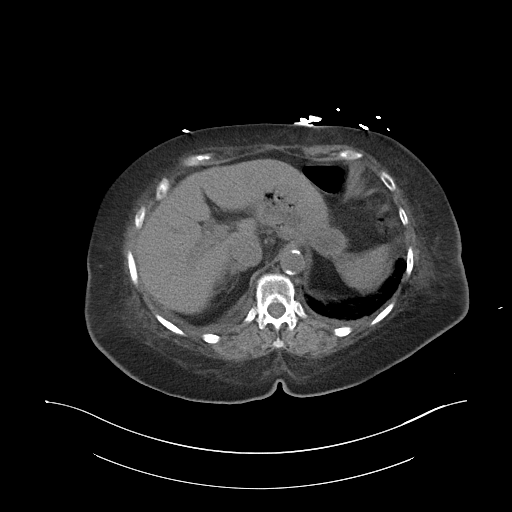
[im 84/97  soft-tissue]
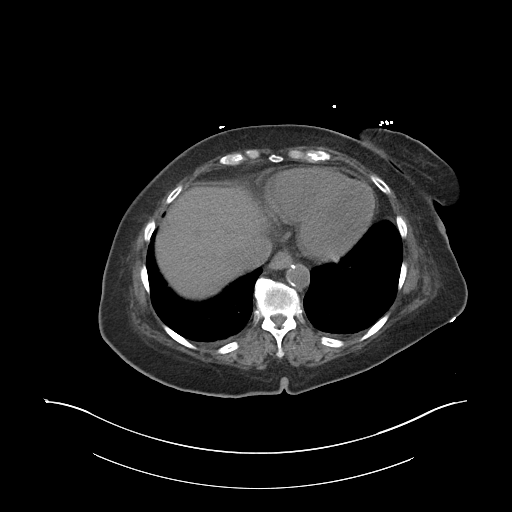
[im 92/97  soft-tissue]
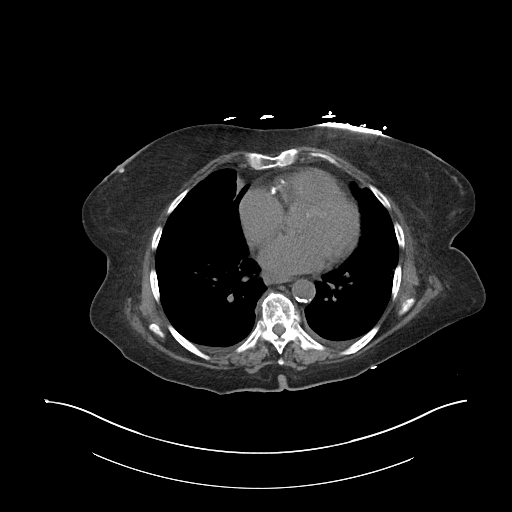

[Series 6: coronal · coronal · 0.92mm/px · 3 of 101 slices shown]
[im 34/101  soft-tissue]
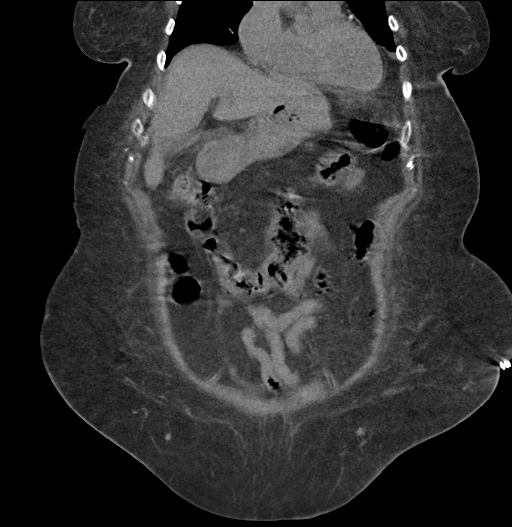
[im 45/101  soft-tissue]
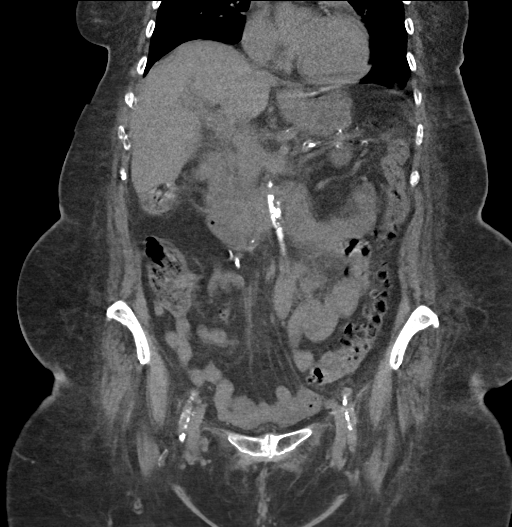
[im 56/101  soft-tissue]
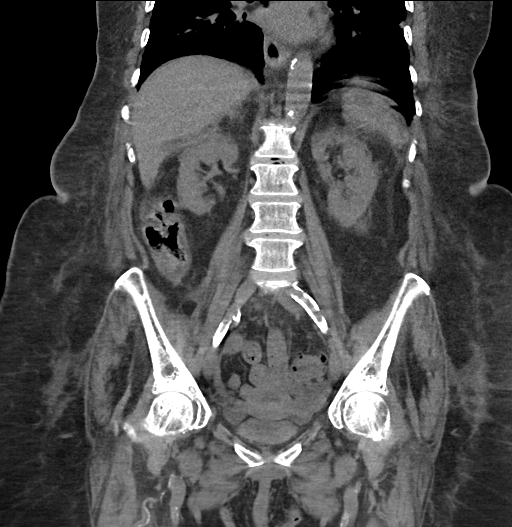

[16 of 46 positions shown; findings below may reference images not displayed]

FINDINGS: Lower chest: Lung bases demonstrate small pleural effusions
bilaterally. No focal infiltrate is seen. Coronary calcifications
are noted.

Hepatobiliary: No focal liver abnormality is seen. Status post
cholecystectomy. No biliary dilatation.

Pancreas: Unremarkable. No pancreatic ductal dilatation or
surrounding inflammatory changes.

Spleen: Normal in size without focal abnormality.

Adrenals/Urinary Tract: Adrenal glands are unremarkable. Kidneys are
normal, without renal calculi, focal lesion, or hydronephrosis.
Bladder is unremarkable. Kidneys demonstrate a normal appearance
without renal calculi or obstructive changes. Renal vascular
calcifications are noted. A cystic lesion is noted in the left
kidney measuring 3.2 cm. This is incompletely evaluated on this
exam. A smaller exophytic cyst is noted inferiorly measuring 1.5 cm.
The bladder is decompressed.

Stomach/Bowel: Scattered diverticular change of the colon is noted
without evidence of diverticulitis. No obstructive or inflammatory
changes are seen. The appendix is within normal limits. Small bowel
and stomach are unremarkable.

Vascular/Lymphatic: Aortic atherosclerosis. No enlarged abdominal or
pelvic lymph nodes.

Reproductive: Uterus is well visualized. Mild fluid attenuation is
noted centrally within the endometrial canal slightly prominent
given the patient's age. A hypodense lesion is noted involving the
left ovary suspicious for simple cyst.

Other: Minimal free fluid is noted likely physiologic in nature.

Musculoskeletal: Degenerative changes of the lumbar spine are noted.
IMPRESSION: Cystic lesions within the left kidney without obstructive change.
These are likely simple in nature although incompletely evaluated on
this exam.

Mild fluid attenuation within the endometrial canal although
slightly more prominent than that expected given the patient's age.
Left ovarian cystic lesion is noted as well. Nonemergent ultrasound
can be performed as clinically indicated for further evaluation

## 2021-07-29 IMAGING — DX DG CHEST 1V PORT
1 series · 1 of 1 positions shown · non-contrast
Comparison: Yesterday

CLINICAL DATA: Respiratory failure

EXAM:
PORTABLE CHEST 1 VIEW

[chest ap]
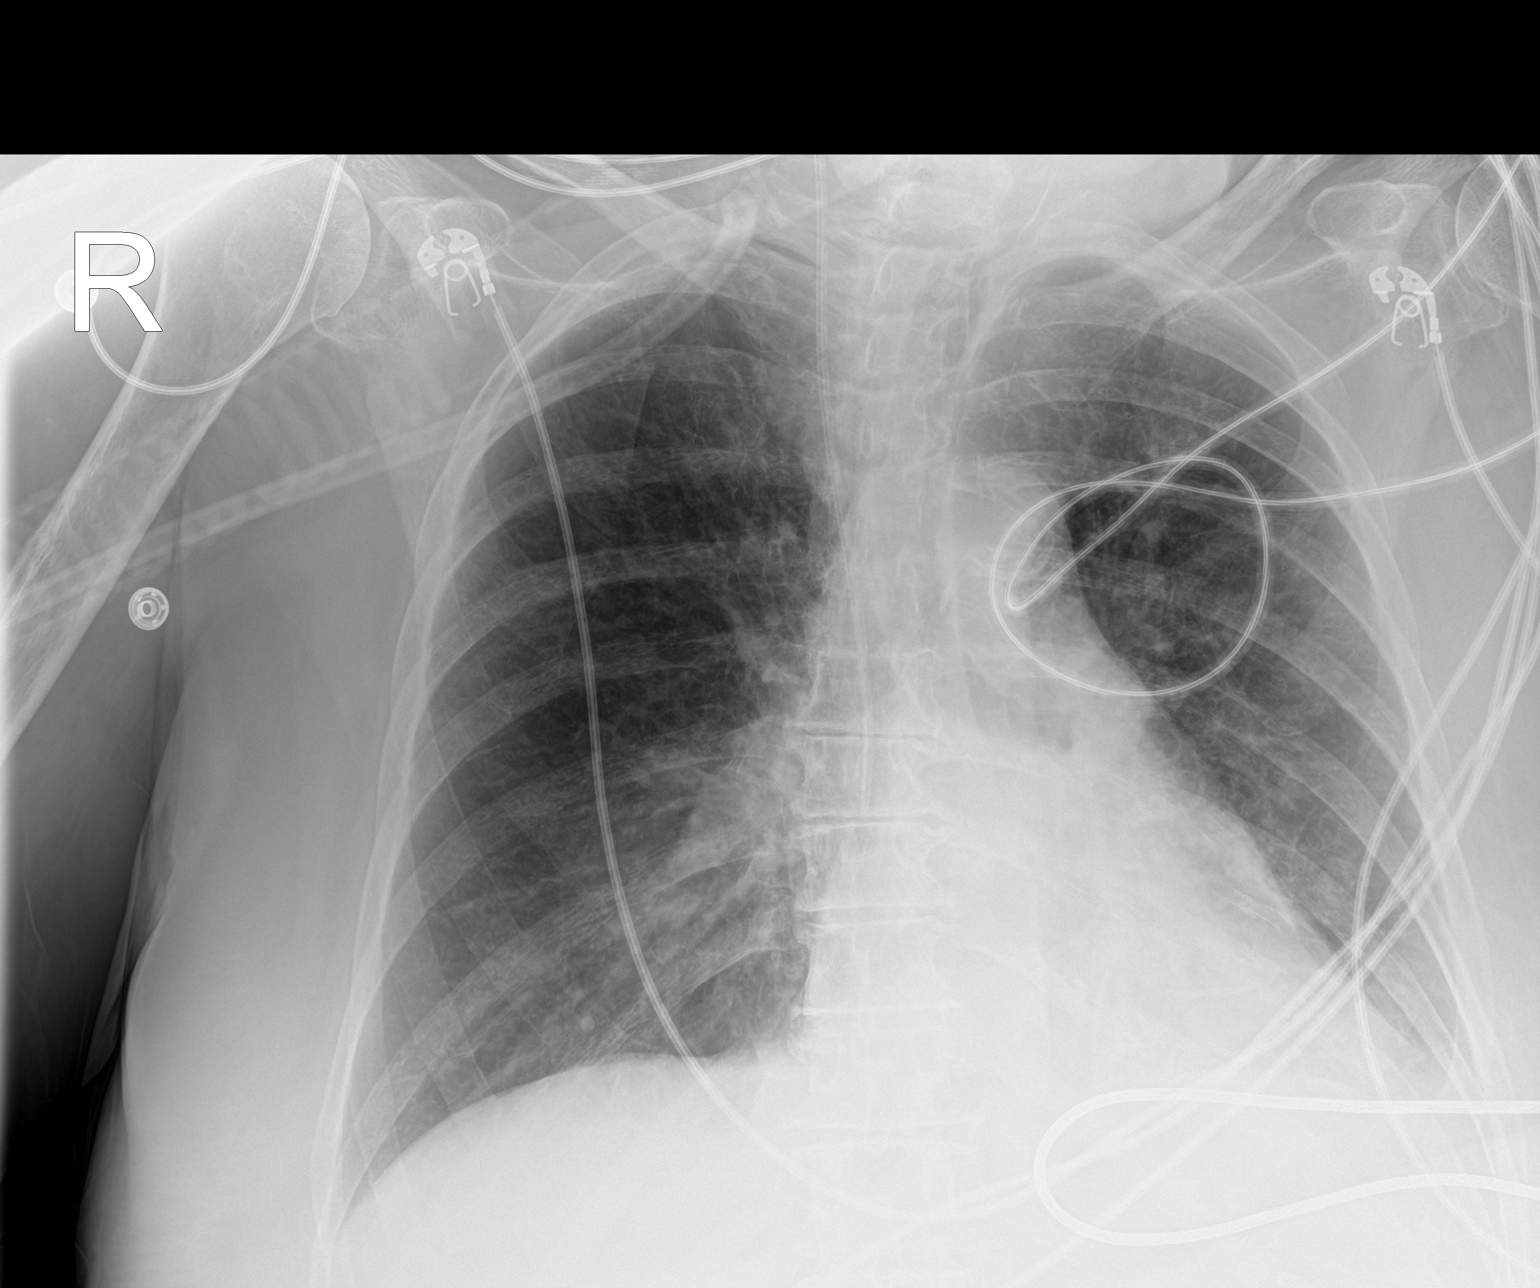

[1 of 1 positions shown; findings below may reference images not displayed]

FINDINGS: Right IJ line with tip at the upper cavoatrial junction.

Retrocardiac opacity which is new/worsened. Normal heart size.
Coronary stenting. No visible effusion or pneumothorax.
IMPRESSION: Worsening left lower lobe aeration which may be pneumonia.

## 2021-08-01 IMAGING — DX DG CHEST 1V PORT
1 series · 1 of 1 positions shown · non-contrast
Comparison: 11/26/2019 chest radiograph and prior.

CLINICAL DATA: Hypoxia

EXAM:
PORTABLE CHEST 1 VIEW

[chest ap]
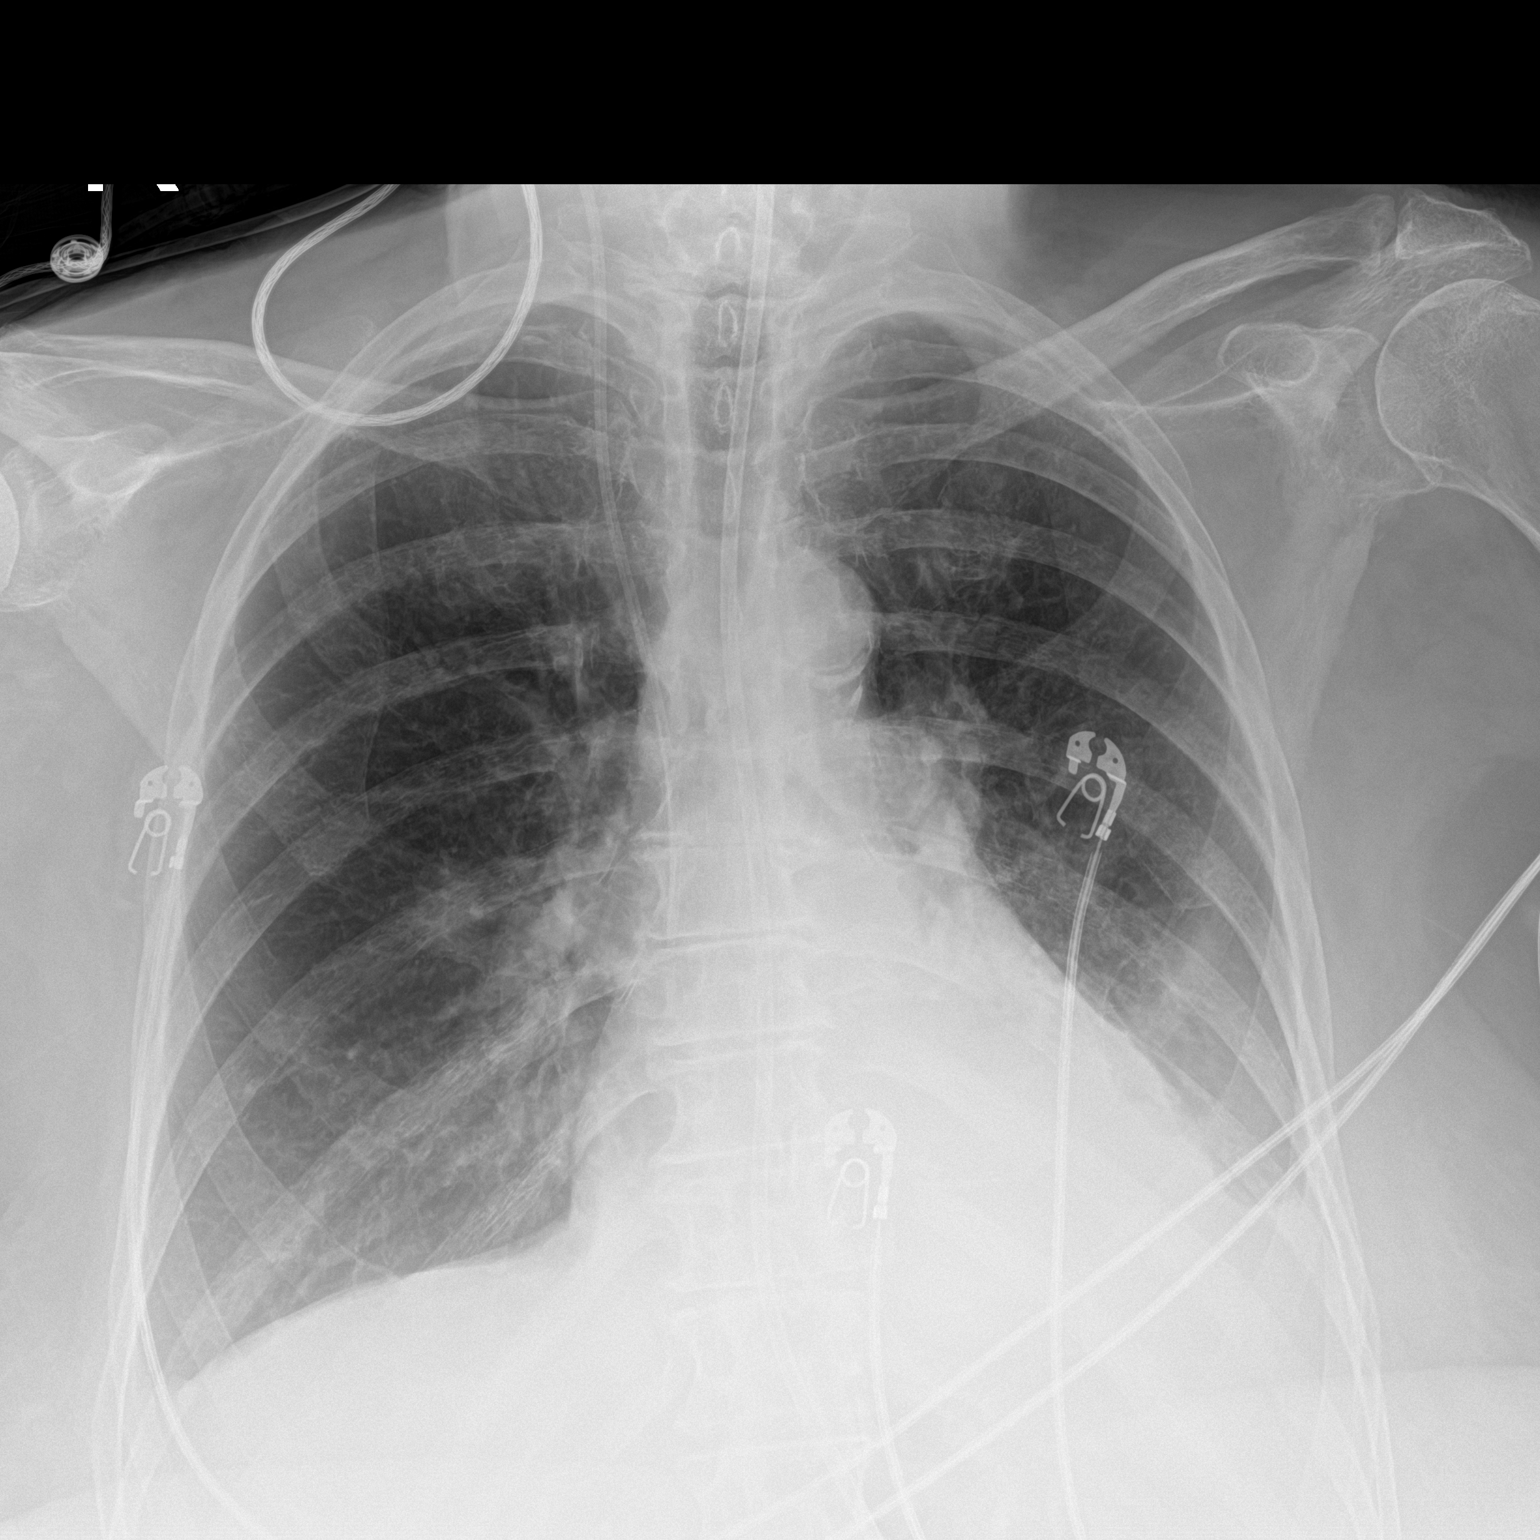

[1 of 1 positions shown; findings below may reference images not displayed]

FINDINGS: Stable appearance of right IJ CVC. Partially imaged enteric tube
terminates outside field of view.

Increased left basilar/retrocardiac consolidative opacities. Small
left pleural effusion, increased. No pneumothorax.

Cardiomediastinal silhouette and osseous structures are unchanged.
IMPRESSION: 1. Increased left basilar/retrocardiac consolidative opacities.
2. Small left pleural effusion, increased.
